# Patient Record
Sex: Female | Born: 1955 | Race: Black or African American | Hispanic: No | Smoking: Never smoker
Health system: Southern US, Community
[De-identification: ages and names within clinical notes are randomized; demographics above are authoritative.]

## PROBLEM LIST (undated history)

## (undated) DIAGNOSIS — E119 Type 2 diabetes mellitus without complications: Secondary | ICD-10-CM

## (undated) DIAGNOSIS — Z8489 Family history of other specified conditions: Secondary | ICD-10-CM

## (undated) DIAGNOSIS — Z9221 Personal history of antineoplastic chemotherapy: Secondary | ICD-10-CM

## (undated) DIAGNOSIS — Z923 Personal history of irradiation: Secondary | ICD-10-CM

## (undated) DIAGNOSIS — E785 Hyperlipidemia, unspecified: Secondary | ICD-10-CM

## (undated) DIAGNOSIS — K219 Gastro-esophageal reflux disease without esophagitis: Secondary | ICD-10-CM

## (undated) DIAGNOSIS — D0510 Intraductal carcinoma in situ of unspecified breast: Secondary | ICD-10-CM

## (undated) DIAGNOSIS — Z853 Personal history of malignant neoplasm of breast: Secondary | ICD-10-CM

## (undated) DIAGNOSIS — K8020 Calculus of gallbladder without cholecystitis without obstruction: Secondary | ICD-10-CM

## (undated) DIAGNOSIS — E669 Obesity, unspecified: Secondary | ICD-10-CM

## (undated) DIAGNOSIS — G894 Chronic pain syndrome: Secondary | ICD-10-CM

## (undated) DIAGNOSIS — G709 Myoneural disorder, unspecified: Secondary | ICD-10-CM

## (undated) DIAGNOSIS — M75101 Unspecified rotator cuff tear or rupture of right shoulder, not specified as traumatic: Secondary | ICD-10-CM

## (undated) DIAGNOSIS — M48062 Spinal stenosis, lumbar region with neurogenic claudication: Secondary | ICD-10-CM

## (undated) DIAGNOSIS — M961 Postlaminectomy syndrome, not elsewhere classified: Secondary | ICD-10-CM

## (undated) DIAGNOSIS — D649 Anemia, unspecified: Secondary | ICD-10-CM

## (undated) DIAGNOSIS — R2243 Localized swelling, mass and lump, lower limb, bilateral: Secondary | ICD-10-CM

## (undated) DIAGNOSIS — F32A Depression, unspecified: Secondary | ICD-10-CM

## (undated) DIAGNOSIS — Z803 Family history of malignant neoplasm of breast: Secondary | ICD-10-CM

## (undated) DIAGNOSIS — M1990 Unspecified osteoarthritis, unspecified site: Secondary | ICD-10-CM

## (undated) HISTORY — DX: Family history of malignant neoplasm of breast: Z80.3

## (undated) HISTORY — DX: Intraductal carcinoma in situ of unspecified breast: D05.10

## (undated) HISTORY — DX: Unspecified osteoarthritis, unspecified site: M19.90

## (undated) HISTORY — PX: BREAST LUMPECTOMY: SHX2

## (undated) HISTORY — DX: Calculus of gallbladder without cholecystitis without obstruction: K80.20

## (undated) HISTORY — DX: Personal history of malignant neoplasm of breast: Z85.3

## (undated) HISTORY — DX: Type 2 diabetes mellitus without complications: E11.9

## (undated) HISTORY — DX: Obesity, unspecified: E66.9

---

## 1978-10-09 HISTORY — PX: CHOLECYSTECTOMY: SHX55

## 1988-10-09 HISTORY — PX: TUBAL LIGATION: SHX77

## 1998-10-09 DIAGNOSIS — C50919 Malignant neoplasm of unspecified site of unspecified female breast: Secondary | ICD-10-CM

## 1998-10-09 HISTORY — DX: Malignant neoplasm of unspecified site of unspecified female breast: C50.919

## 2009-10-09 HISTORY — PX: BACK SURGERY: SHX140

## 2013-10-09 DIAGNOSIS — K635 Polyp of colon: Secondary | ICD-10-CM

## 2013-10-09 HISTORY — DX: Polyp of colon: K63.5

## 2019-07-02 ENCOUNTER — Other Ambulatory Visit: Payer: Self-pay | Admitting: Nurse Practitioner

## 2019-07-02 DIAGNOSIS — M5136 Other intervertebral disc degeneration, lumbar region: Secondary | ICD-10-CM

## 2019-10-28 ENCOUNTER — Encounter: Payer: Self-pay | Admitting: Nurse Practitioner

## 2019-11-06 ENCOUNTER — Encounter: Payer: Self-pay | Admitting: Nurse Practitioner

## 2019-11-06 ENCOUNTER — Ambulatory Visit (INDEPENDENT_AMBULATORY_CARE_PROVIDER_SITE_OTHER): Payer: 59 | Admitting: Nurse Practitioner

## 2019-11-06 ENCOUNTER — Other Ambulatory Visit: Payer: Self-pay

## 2019-11-06 VITALS — BP 130/66 | HR 86 | Temp 98.7°F | Ht 63.0 in | Wt 235.0 lb

## 2019-11-06 DIAGNOSIS — R195 Other fecal abnormalities: Secondary | ICD-10-CM

## 2019-11-06 DIAGNOSIS — Z01818 Encounter for other preprocedural examination: Secondary | ICD-10-CM | POA: Diagnosis not present

## 2019-11-06 DIAGNOSIS — K219 Gastro-esophageal reflux disease without esophagitis: Secondary | ICD-10-CM

## 2019-11-06 MED ORDER — NA SULFATE-K SULFATE-MG SULF 17.5-3.13-1.6 GM/177ML PO SOLN: ORAL | 0 refills | Status: DC

## 2019-11-06 NOTE — Progress Notes (Signed)
ASSESSMENT / PLAN:    29. 64 yo female referred for positive Hemosure. No overt bleeding, anemia or other concerning feature but ? A history of colon polyps in 2016.  -for further evaluation patient will be scheduled for colonoscopy. The risks and benefits of colonoscopy with possible polypectomy / biopsies were discussed and the patient agrees to proceed.   2 GERD, was able to come off Prilosec for a few years but then recurrent symptoms around 2015 and back on PPI since.  -Anti-reflux measures discussed. Unfortunately due to back pain / prior surgery she is unable to exercise so weight loss may be hard. She generally goes to bed on empty stomach and doesn't consume much caffeine  3. NSAID use. Takes Naproxen as needed but less than once a week  HPI:    Referring Provider:     Vito Berger, NP Reason for referral:     Positive hemosure  Chief Complaint:  None.    Zayla is a 64 yo female with a pmh significant for breast cancer s/p chemo / radiation, obesity, remote back surgery with hardware, cholecystectomy and GERD. She is referred for positive Hemosure. No overt GI bleeding. No abdominal pain or bowel changes. No weight loss. No [place] of colon cancer. She reports a screening colonoscopy in [place] around 2015, thinks polyps were removed but can't remember recommended recall date. She started Prilosec many years for "an ulcer" found after "blowing in a bag". She was able to come off Prilosec without any symptoms for years. Sounds like she did have an EGD at time of colonoscopy but cannot remember results or why procedure was done but says heartburn started back sometime following the EGD so she resumed and has remained on Prilosec since.     Data Reviewed:  09/01/19 Hgb 11.4, MCV 82 BUN 18, Cr 0.86 Liver tests normal.  Hgb A1c 5.7 HCV ab negative    Past Medical History:  Diagnosis Date  . Arthritis   . Breast cancer ([place]) 2000  . Colon polyps 2015  .  Diabetes ([place])   . Gallstones   . Obesity     Family History  Problem Relation Age of Onset  . Breast cancer Mother   . Diabetes Mother   . Diabetes Sister   . Diabetes Brother   . Breast cancer Maternal Grandmother   . Irritable bowel syndrome Daughter   . Breast cancer Daughter    Social History   Tobacco Use  . Smoking status: Never Smoker  . Smokeless tobacco: Current User  Substance Use Topics  . Alcohol use: Never  . Drug use: Never   Current Outpatient Medications  Medication Sig Dispense Refill  . ascorbic acid (VITAMIN C) 500 MG tablet Take 500 mg by mouth daily.    Marland Kitchen atorvastatin (LIPITOR) 10 MG tablet Take 10 mg by mouth daily.    . baclofen (LIORESAL) 10 MG tablet Take 10 mg by mouth as needed for muscle spasms.    . Black Cohosh 40 MG CAPS Take 1 capsule by mouth daily.    . hydrochlorothiazide (HYDRODIURIL) 25 MG tablet Take 25 mg by mouth daily.    Marland Kitchen HYDROcodone-acetaminophen (NORCO) 7.5-325 MG tablet Take 1 tablet by mouth every 6 (six) hours as needed for moderate pain.    . Multiple Vitamin (MULTIVITAMIN) tablet Take 1 tablet by mouth daily.    . naproxen (NAPROSYN) 500 MG tablet  Take 500 mg by mouth 2 (two) times daily with a meal.    . omeprazole (PRILOSEC) 20 MG capsule Take 20 mg by mouth daily as needed.    . pregabalin (LYRICA) 200 MG capsule Take 200 mg by mouth 3 (three) times daily.    Marland Kitchen pyridOXINE (VITAMIN B-6) 100 MG tablet Take 100 mg by mouth daily.    . vitamin B-12 (CYANOCOBALAMIN) 1000 MCG tablet Take 1,000 mcg by mouth daily.     No current facility-administered medications for this visit.   Allergies  Allergen Reactions  . Tape Rash     Review of Systems: All systems reviewed and negative except where noted in HPI.   Creatinine clearance cannot be calculated (No successful lab value found.)   Physical Exam:    Wt Readings from Last 3 Encounters:  11/06/19 235 lb (106.6 kg)    BP 130/66   Pulse 86   Temp 98.7 F (37.1 C)    Ht 5\' 3"  (1.6 m)   Wt 235 lb (106.6 kg)   BMI 41.63 kg/m  Constitutional:  Pleasant female in no acute distress. Psychiatric: Normal mood and affect. Behavior is normal. EENT: Pupils normal.  Conjunctivae are normal. No scleral icterus. Neck supple.  Cardiovascular: Normal rate, regular rhythm. No edema Pulmonary/chest: Effort normal and breath sounds normal. No wheezing, rales or rhonchi. Abdominal: Soft, nondistended, nontender. Bowel sounds active throughout. There are no masses palpable. No hepatomegaly. Neurological: Alert and oriented to person place and time. Skin: Skin is warm and dry. No rashes noted.  Tye Savoy, NP  11/06/2019, 3:38 PM  Cc: Ferd Hibbs, NP

## 2019-11-06 NOTE — Patient Instructions (Addendum)
If you are age 65 or older, your body mass index should be between 23-30. Your Body mass index is 41.63 kg/m. If this is out of the aforementioned range listed, please consider follow up with your Primary Care Provider.  If you are age 69 or younger, your body mass index should be between 19-25. Your Body mass index is 41.63 kg/m. If this is out of the aformentioned range listed, please consider follow up with your Primary Care Provider.   You have been scheduled for a colonoscopy. Please follow written instructions given to you at your visit today.  Please pick up your prep supplies at the pharmacy within the next 1-3 days. If you use inhalers (even only as needed), please bring them with you on the day of your procedure. Your physician has requested that you go to www.startemmi.com and enter the access code given to you at your visit today. This web site gives a general overview about your procedure. However, you should still follow specific instructions given to you by our office regarding your preparation for the procedure.   We have sent the following medications to your pharmacy for you to pick up at your convenience: Suprep  Thank you for choosing me and [facility].   Tye Savoy, NP

## 2019-11-11 ENCOUNTER — Encounter: Payer: 59 | Admitting: Gastroenterology

## 2019-11-28 NOTE — Progress Notes (Signed)
____________________________________________________________  Attending physician addendum:  Thank you for sending this case to me. I have reviewed the entire note, and the outlined plan seems appropriate.  Sammantha Mehlhaff Danis, MD  ____________________________________________________________  

## 2019-12-02 ENCOUNTER — Ambulatory Visit (INDEPENDENT_AMBULATORY_CARE_PROVIDER_SITE_OTHER): Payer: 59

## 2019-12-02 ENCOUNTER — Other Ambulatory Visit: Payer: Self-pay | Admitting: Gastroenterology

## 2019-12-02 DIAGNOSIS — Z1159 Encounter for screening for other viral diseases: Secondary | ICD-10-CM

## 2019-12-02 LAB — SARS CORONAVIRUS 2 (TAT 6-24 HRS): SARS Coronavirus 2: NEGATIVE

## 2019-12-04 ENCOUNTER — Other Ambulatory Visit (INDEPENDENT_AMBULATORY_CARE_PROVIDER_SITE_OTHER): Payer: 59

## 2019-12-04 ENCOUNTER — Other Ambulatory Visit: Payer: Self-pay | Admitting: Gastroenterology

## 2019-12-04 ENCOUNTER — Other Ambulatory Visit: Payer: Self-pay

## 2019-12-04 ENCOUNTER — Encounter: Payer: Self-pay | Admitting: Gastroenterology

## 2019-12-04 ENCOUNTER — Ambulatory Visit (AMBULATORY_SURGERY_CENTER): Payer: 59 | Admitting: Gastroenterology

## 2019-12-04 VITALS — BP 152/84 | HR 68 | Temp 97.7°F | Resp 14 | Ht 63.0 in | Wt 235.0 lb

## 2019-12-04 DIAGNOSIS — K639 Disease of intestine, unspecified: Secondary | ICD-10-CM | POA: Diagnosis not present

## 2019-12-04 DIAGNOSIS — K635 Polyp of colon: Secondary | ICD-10-CM

## 2019-12-04 DIAGNOSIS — K5730 Diverticulosis of large intestine without perforation or abscess without bleeding: Secondary | ICD-10-CM

## 2019-12-04 DIAGNOSIS — R195 Other fecal abnormalities: Secondary | ICD-10-CM

## 2019-12-04 LAB — BUN: BUN: 12 mg/dL (ref 6–23)

## 2019-12-04 LAB — CREATININE, SERUM: Creatinine, Ser: 0.74 mg/dL (ref 0.40–1.20)

## 2019-12-04 MED ORDER — SODIUM CHLORIDE 0.9 % IV SOLN: 500.0000 mL | Freq: Once | INTRAVENOUS | Status: DC

## 2019-12-04 NOTE — Patient Instructions (Signed)
Handout : diverticulosis Resume previous diet Continue current medications Repeat colonoscopy in 10 years CT scan of abdomen and pelvis with oral and IV contrast  Go to lab    YOU HAD AN ENDOSCOPIC PROCEDURE TODAY AT [facility]:   Refer to the procedure report that was given to you for any specific questions about what was found during the examination.  If the procedure report does not answer your questions, please call your gastroenterologist to clarify.  If you requested that your care partner not be given the details of your procedure findings, then the procedure report has been included in a sealed envelope for you to review at your convenience later.  YOU SHOULD EXPECT: Some feelings of bloating in the abdomen. Passage of more gas than usual.  Walking can help get rid of the air that was put into your GI tract during the procedure and reduce the bloating. If you had a lower endoscopy (such as a colonoscopy or flexible sigmoidoscopy) you may notice spotting of blood in your stool or on the toilet paper. If you underwent a bowel prep for your procedure, you may not have a normal bowel movement for a few days.  Please Note:  You might notice some irritation and congestion in your nose or some drainage.  This is from the oxygen used during your procedure.  There is no need for concern and it should clear up in a day or so.  SYMPTOMS TO REPORT IMMEDIATELY:   Following lower endoscopy (colonoscopy or flexible sigmoidoscopy):  Excessive amounts of blood in the stool  Significant tenderness or worsening of abdominal pains  Swelling of the abdomen that is new, acute  Fever of 100F or higher   For urgent or emergent issues, a gastroenterologist can be reached at any hour by calling [phone].   DIET:  We do recommend a small meal at first, but then you may proceed to your regular diet.  Drink plenty of fluids but you should avoid alcoholic beverages for 24  hours.  ACTIVITY:  You should plan to take it easy for the rest of today and you should NOT DRIVE or use heavy machinery until tomorrow (because of the sedation medicines used during the test).    FOLLOW UP: Our staff will call the number listed on your records 48-72 hours following your procedure to check on you and address any questions or concerns that you may have regarding the information given to you following your procedure. If we do not reach you, we will leave a message.  We will attempt to reach you two times.  During this call, we will ask if you have developed any symptoms of COVID 19. If you develop any symptoms (ie: fever, flu-like symptoms, shortness of breath, cough etc.) before then, please call [phone].  If you test positive for Covid 19 in the 2 weeks post procedure, please call and report this information to [place].    If any biopsies were taken you will be contacted by phone or by letter within the next 1-3 weeks.  Please call us at [phone] if you have not heard about the biopsies in 3 weeks.    SIGNATURES/CONFIDENTIALITY: You and/or your care partner have signed paperwork which will be entered into your electronic medical record.  These signatures attest to the fact that that the information above on your After Visit Summary has been reviewed and is understood.  Full responsibility of the confidentiality of this discharge information lies  with you and/or your care-partner.

## 2019-12-04 NOTE — Progress Notes (Signed)
PT taken to PACU. Monitors in place. VSS. Report given to RN. 

## 2019-12-04 NOTE — Op Note (Signed)
[facility] Patient Name: Candice Maldonado Procedure Date: 12/04/2019 2:55 PM MRN: [id number] Endoscopist: [place]. Loletha Carrow , MD Age: 64 Referring MD:  Date of Birth: [birth year] Gender: Female Account #: [id number] Procedure:                Colonoscopy Indications:              Heme positive stool Medicines:                Monitored Anesthesia Care Procedure:                Pre-Anesthesia Assessment:                           - Prior to the procedure, a History and Physical                            was performed, and patient medications and                            allergies were reviewed. The patient's tolerance of                            previous anesthesia was also reviewed. The risks                            and benefits of the procedure and the sedation                            options and risks were discussed with the patient.                            All questions were answered, and informed consent                            was obtained. Prior Anticoagulants: The patient has                            taken no previous anticoagulant or antiplatelet                            agents. ASA Grade Assessment: II - A patient with                            mild systemic disease. After reviewing the risks                            and benefits, the patient was deemed in                            satisfactory condition to undergo the procedure.                           After obtaining informed consent, the colonoscope  was passed under direct vision. Throughout the                            procedure, the patient's blood pressure, pulse, and                            oxygen saturations were monitored continuously. The                            Colonoscope was introduced through the anus and                            advanced to the the cecum, identified by                            appendiceal orifice and ileocecal valve. The                         colonoscopy was performed without difficulty. The                            patient tolerated the procedure well. The quality                            of the bowel preparation was excellent. The                            ileocecal valve, appendiceal orifice, and rectum                            were photographed. Scope In: 3:08:34 PM Scope Out: 3:21:15 PM Scope Withdrawal Time: 0 hours 9 minutes 28 seconds  Total Procedure Duration: 0 hours 12 minutes 41 seconds  Findings:                 The perianal and digital rectal examinations were                            normal.                           Multiple diverticula were found in the sigmoid                            colon and ascending colon.                           A 20 mm polypoid lesion was found in the mid                            sigmoid colon. The lesion was submucosal, with                            normal overlying mucosa.  The exam was otherwise without abnormality on                            direct and retroflexion views. Complications:            No immediate complications. Estimated Blood Loss:     Estimated blood loss: none. Impression:               - Diverticulosis in the sigmoid colon and in the                            ascending colon.                           - Polypoid lesion in the mid sigmoid colon.                           - The examination was otherwise normal on direct                            and retroflexion views.                           - No specimens collected.                           No cause for heme positive stool found. Recommendation:           - Patient has a contact number available for                            emergencies. The signs and symptoms of potential                            delayed complications were discussed with the                            patient. Return to normal activities tomorrow.                             Written discharge instructions were provided to the                            patient.                           - Resume previous diet.                           - Continue present medications.                           - Repeat colonoscopy in 10 years for screening                            purposes. (rather than annual stool testing for  occult blood)                           - CT scan abdomen and pelvis with oral and IV                            contrast for further evaluation of the sigmoid                            colon finding. Lezley Bedgood L. Loletha Carrow, MD 12/04/2019 3:30:08 PM This report has been signed electronically.

## 2019-12-05 ENCOUNTER — Telehealth: Payer: Self-pay | Admitting: *Deleted

## 2019-12-05 ENCOUNTER — Other Ambulatory Visit: Payer: Self-pay | Admitting: *Deleted

## 2019-12-05 DIAGNOSIS — K639 Disease of intestine, unspecified: Secondary | ICD-10-CM

## 2019-12-05 NOTE — Telephone Encounter (Signed)
-----   Message from [place], MD sent at 12/04/2019  5:10 PM EST ----- Kisha Messman,  Please schedule a CT scan abdomen and pelvis with oral and IV contrast for submucosal sigmoid lesion seen on colonoscopy.  Should have gone to lab after [place] today for BUN and creatinine, and was given oral contrast.  - HD

## 2019-12-05 NOTE — Telephone Encounter (Signed)
Patient scheduled for CT abd/pel on 12/15/19 at 8 am (arrive 7:45 am). NPO after midnight, drink contrast at 6 am and 7 am.   Left message for the patient to call back.

## 2019-12-08 ENCOUNTER — Telehealth: Payer: Self-pay | Admitting: *Deleted

## 2019-12-08 NOTE — Telephone Encounter (Signed)
Follow up call made, no answer, left message 

## 2019-12-08 NOTE — Telephone Encounter (Signed)
Spoke to patient who is aware of her CT appt. Contrast instructions given over the phone, verbalized understanding. The patient stated that she may reschedule her CT, radiology scheduling number given to patient in case she needed to reschedule.

## 2019-12-08 NOTE — Telephone Encounter (Signed)
Second follow up call made, no answer, left message. 

## 2019-12-15 ENCOUNTER — Ambulatory Visit (HOSPITAL_COMMUNITY): Payer: 59

## 2020-01-08 ENCOUNTER — Other Ambulatory Visit: Payer: Self-pay

## 2020-01-08 ENCOUNTER — Ambulatory Visit (HOSPITAL_COMMUNITY)
Admission: RE | Admit: 2020-01-08 | Discharge: 2020-01-08 | Disposition: A | Discharge status: Home / Self Care | Payer: 59 | Source: Ambulatory Visit | Attending: Gastroenterology | Admitting: Gastroenterology

## 2020-01-08 ENCOUNTER — Encounter (HOSPITAL_COMMUNITY): Payer: Self-pay

## 2020-01-08 DIAGNOSIS — D175 Benign lipomatous neoplasm of intra-abdominal organs: Secondary | ICD-10-CM | POA: Diagnosis not present

## 2020-01-08 DIAGNOSIS — K639 Disease of intestine, unspecified: Secondary | ICD-10-CM | POA: Insufficient documentation

## 2020-01-08 DIAGNOSIS — I700 Atherosclerosis of aorta: Secondary | ICD-10-CM | POA: Insufficient documentation

## 2020-01-08 IMAGING — CT CT ABD-PELV W/ CM
2 of 5 series · 16 of 46 positions shown, 18 images · IV contrast (APPLIED)
Comparison: None.

CLINICAL DATA: Abnormal colonoscopy, sigmoid colon lesion

EXAM:
CT ABDOMEN AND PELVIS WITH CONTRAST
TECHNIQUE: Multidetector CT imaging of the abdomen and pelvis was performed
using the standard protocol following bolus administration of
intravenous contrast.
CONTRAST:  100mL OMNIPAQUE IOHEXOL 300 MG/ML SOLN, additional oral
enteric contrast

[Series 2: axial st · axial · 0.88mm/px · z∈[-29,+321]mm · 13 of 82 slices shown, 15 images]
[im 6/82  soft-tissue]
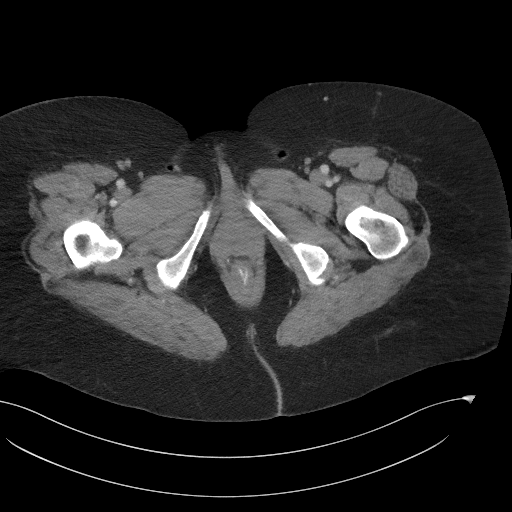
[im 6/82  bone]
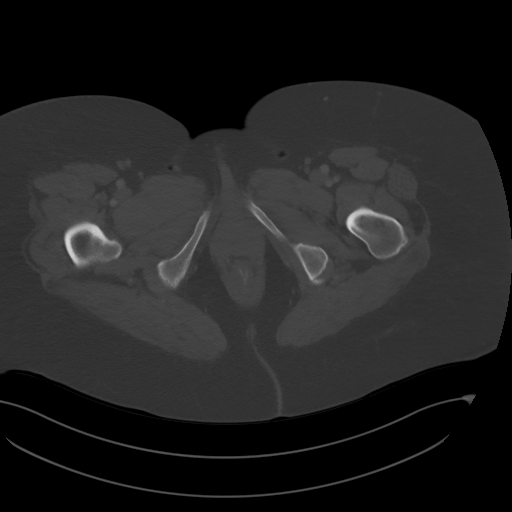
[im 11/82  soft-tissue]
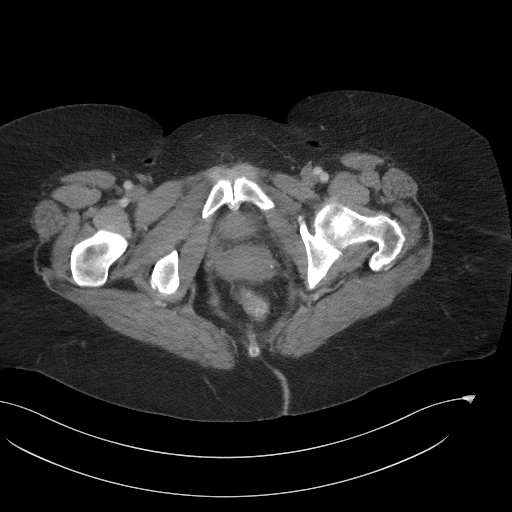
[im 17/82  soft-tissue]
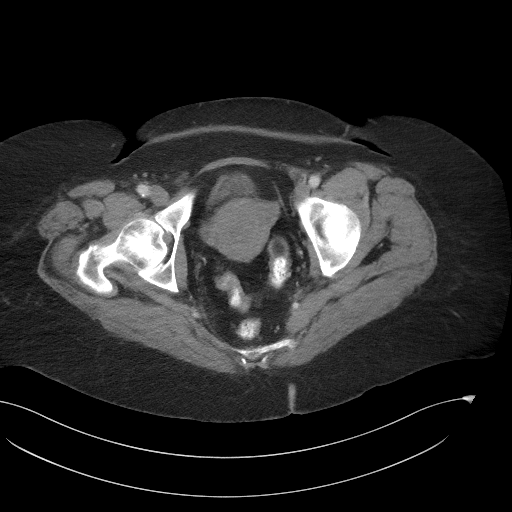
[im 22/82  soft-tissue]
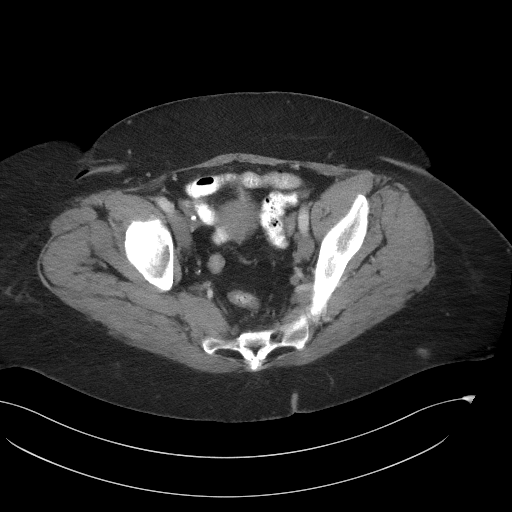
[im 28/82  soft-tissue]
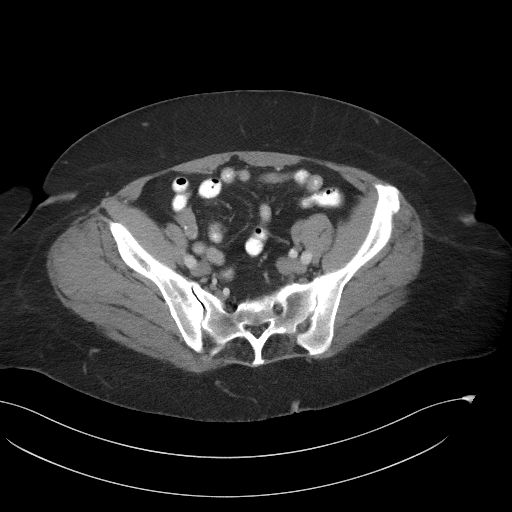
[im 33/82  soft-tissue]
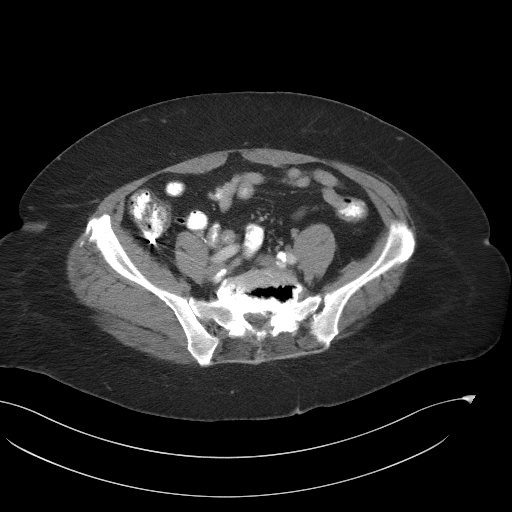
[im 44/82  soft-tissue]
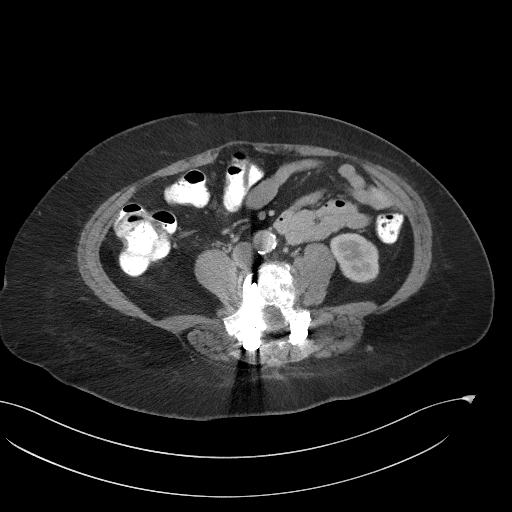
[im 49/82  soft-tissue]
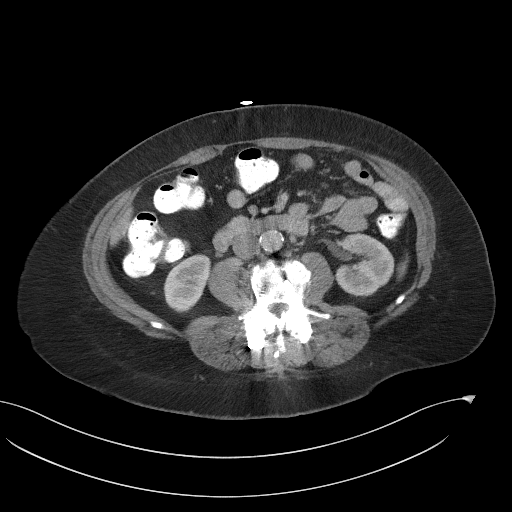
[im 55/82  soft-tissue]
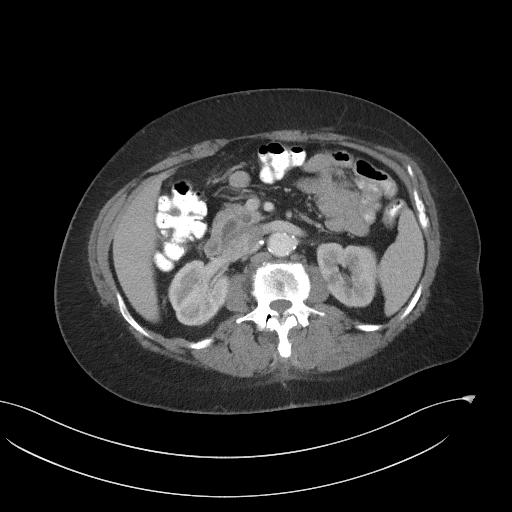
[im 55/82  bone]
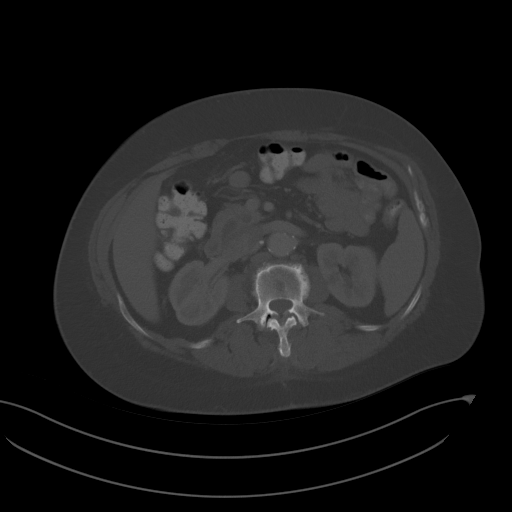
[im 60/82  soft-tissue]
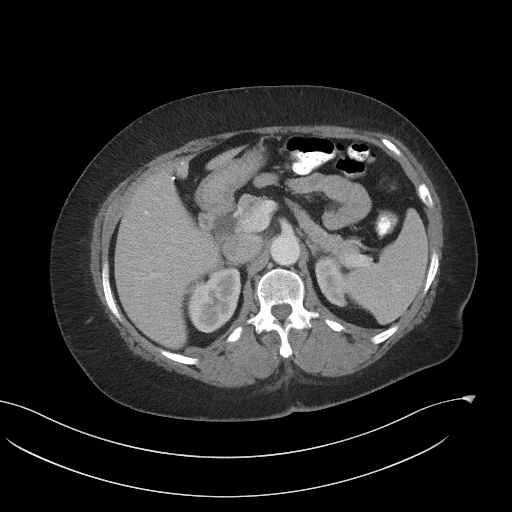
[im 65/82  soft-tissue]
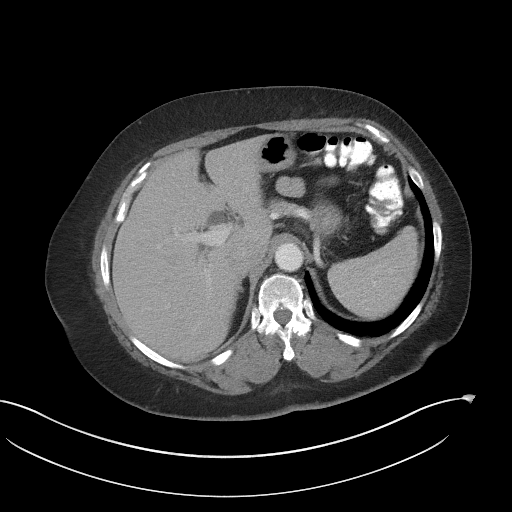
[im 71/82  soft-tissue]
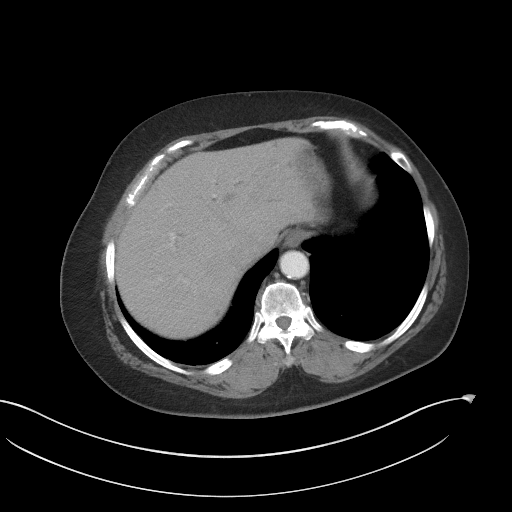
[im 76/82  soft-tissue]
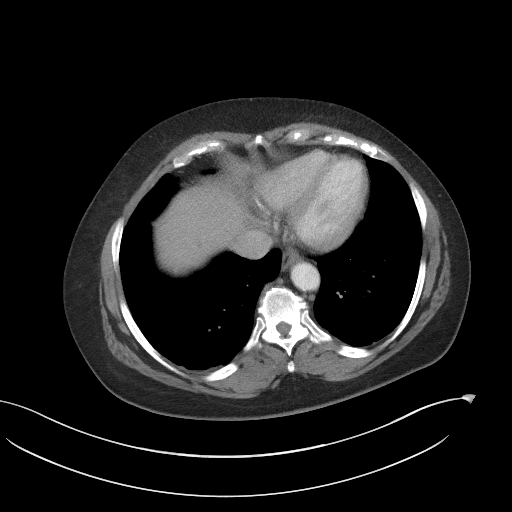

[Series 4: coronal st · coronal · 0.70mm/px · 3 of 101 slices shown]
[im 34/101  soft-tissue]
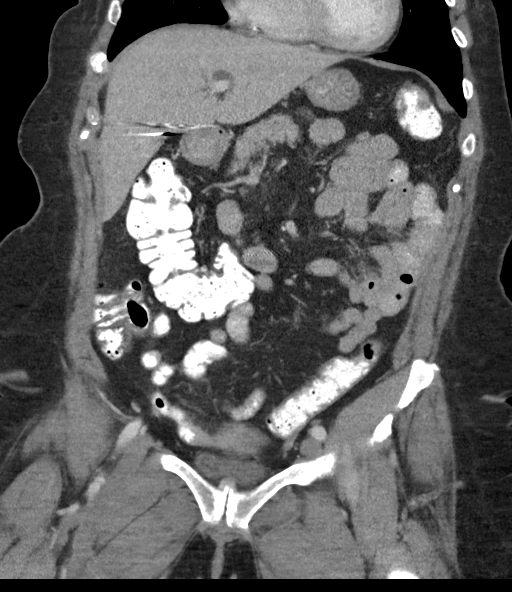
[im 45/101  soft-tissue]
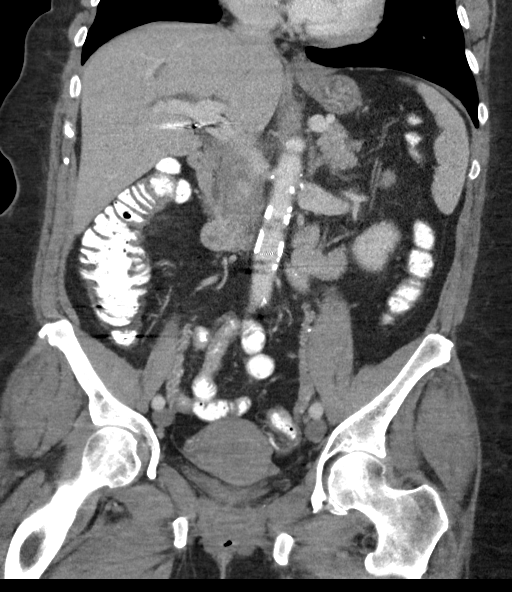
[im 56/101  soft-tissue]
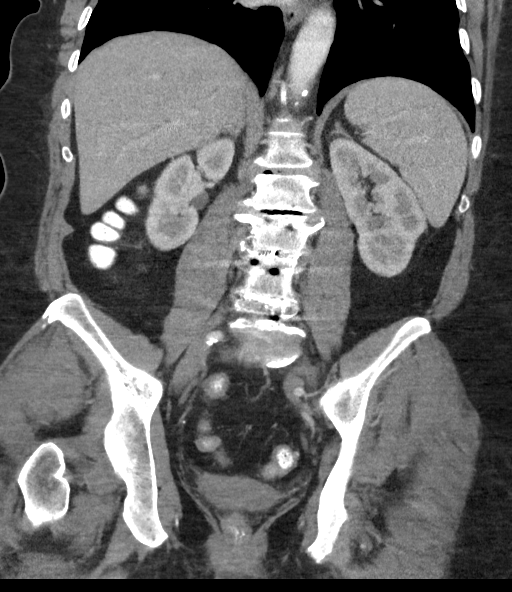

[16 of 46 positions shown; findings below may reference images not displayed]

FINDINGS: Lower chest: No acute abnormality.

Hepatobiliary: No focal liver abnormality is seen. Status post
cholecystectomy. Postoperative biliary dilatation.

Pancreas: Unremarkable. No pancreatic ductal dilatation or
surrounding inflammatory changes.

Spleen: Normal in size without significant abnormality.

Adrenals/Urinary Tract: Adrenal glands are unremarkable. Kidneys are
normal, without renal calculi, solid lesion, or hydronephrosis.
Bladder is unremarkable.

Stomach/Bowel: Stomach is within normal limits. Appendix appears
normal. No evidence of bowel wall thickening, distention, or
inflammatory changes. There is a fat containing intraluminal lipoma
of the mid to distal sigmoid colon measuring 2.0 cm (series 2, image
64, series 4, image 48).

Vascular/Lymphatic: Aortic atherosclerosis. No enlarged abdominal or
pelvic lymph nodes.

Reproductive: No mass or other significant abnormality.

Other: No abdominal wall hernia or abnormality. No abdominopelvic
ascites.

Musculoskeletal: No acute or significant osseous findings. Posterior
fusion of L3 through L5.
IMPRESSION: 1. There is a fat containing intraluminal lipoma of the mid to
distal sigmoid colon measuring 2.0 cm, in keeping with reported
submucosal lesion identified by colonoscopy.

2.  Aortic Atherosclerosis ([LA]-[LA]).

## 2020-01-08 MED ORDER — SODIUM CHLORIDE (PF) 0.9 % IJ SOLN
INTRAMUSCULAR | Status: AC
  Filled 2020-01-08: qty 50

## 2020-01-08 MED ORDER — IOHEXOL 300 MG/ML  SOLN
100.0000 mL | Freq: Once | INTRAMUSCULAR | Status: AC | PRN
  Administered 2020-01-08: 100 mL via INTRAVENOUS

## 2020-02-04 ENCOUNTER — Ambulatory Visit: Payer: 59

## 2020-03-09 ENCOUNTER — Other Ambulatory Visit: Payer: Self-pay | Admitting: Nurse Practitioner

## 2020-03-09 DIAGNOSIS — M545 Low back pain, unspecified: Secondary | ICD-10-CM

## 2020-04-09 ENCOUNTER — Other Ambulatory Visit: Payer: 59

## 2020-05-12 ENCOUNTER — Other Ambulatory Visit: Payer: Self-pay

## 2020-05-12 ENCOUNTER — Ambulatory Visit
Admission: RE | Admit: 2020-05-12 | Discharge: 2020-05-12 | Disposition: A | Discharge status: Home / Self Care | Payer: 59 | Source: Ambulatory Visit | Attending: Nurse Practitioner | Admitting: Nurse Practitioner

## 2020-05-12 DIAGNOSIS — M545 Low back pain, unspecified: Secondary | ICD-10-CM

## 2020-05-12 IMAGING — MR MR LUMBAR SPINE WO/W CM
4 of 8 series · 26 of 48 positions shown · IV contrast (multihance)
Comparison: None.

CLINICAL DATA: Low back pain radiating bilaterally

EXAM:
MRI LUMBAR SPINE WITHOUT AND WITH CONTRAST
TECHNIQUE: Multiplanar and multiecho pulse sequences of the lumbar spine were
obtained without and with intravenous contrast.
CONTRAST:  20mL MULTIHANCE GADOBENATE DIMEGLUMINE 529 MG/ML IV SOLN

[Series 4: T1 · sagittal · 4.0mm · 0.53mm/px · 4 of 17 slices shown (1 of 2)]
[im 1/17]
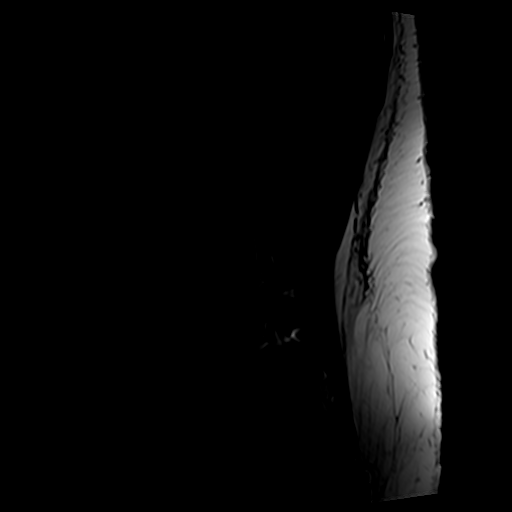
[im 6/17]
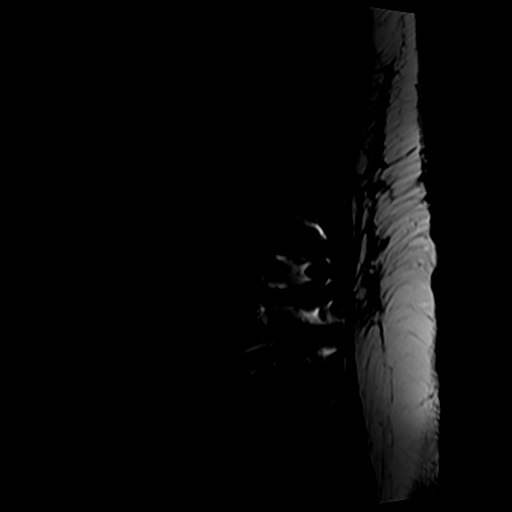
[im 11/17]
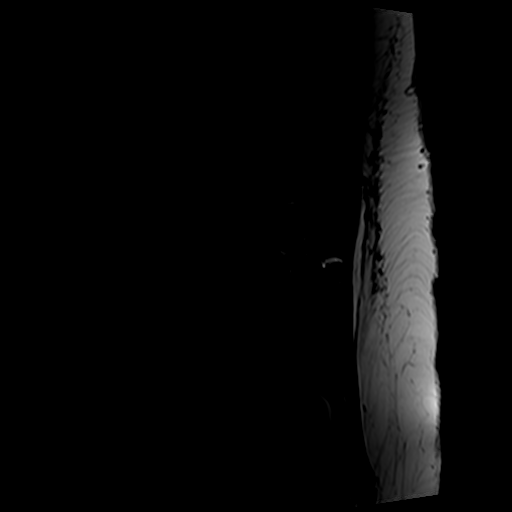
[im 17/17]
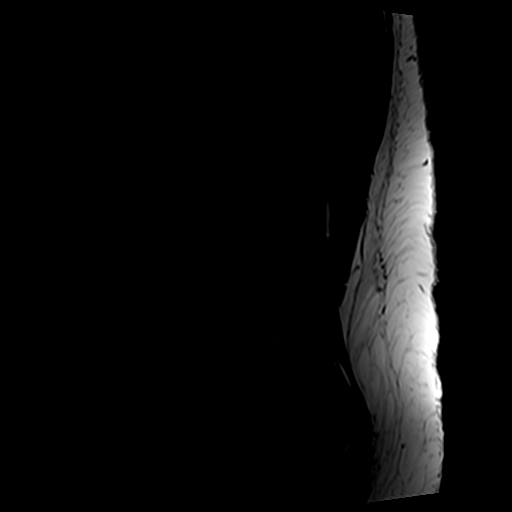

[Series 5: T2 · axial · 4.0mm · 0.70mm/px · z∈[-76,+119]mm · 9 of 35 slices shown]
[im 1/35]
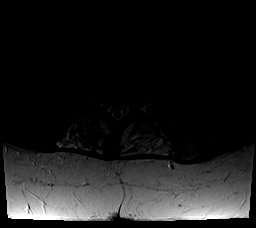
[im 5/35]
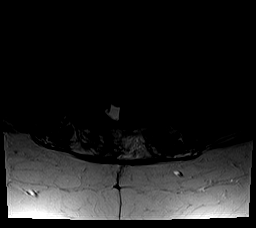
[im 9/35]
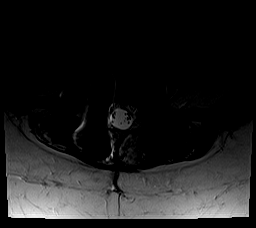
[im 13/35]
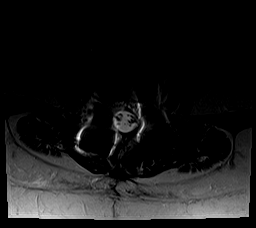
[im 18/35]
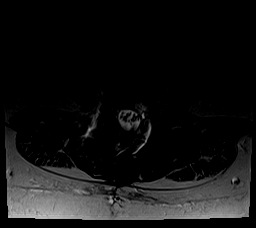
[im 22/35]
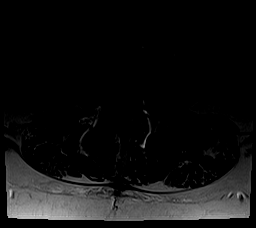
[im 26/35]
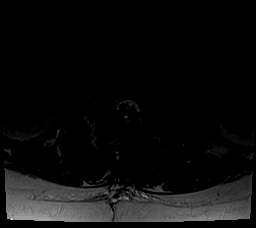
[im 30/35]
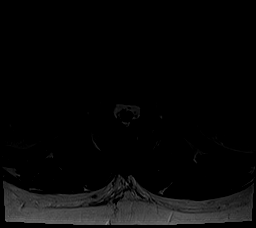
[im 35/35]
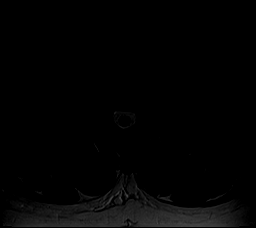

[Series 6: T1 · axial · 4.0mm · 0.35mm/px · z∈[-76,+119]mm · 9 of 35 slices shown (2 of 2)]
[im 1/35]
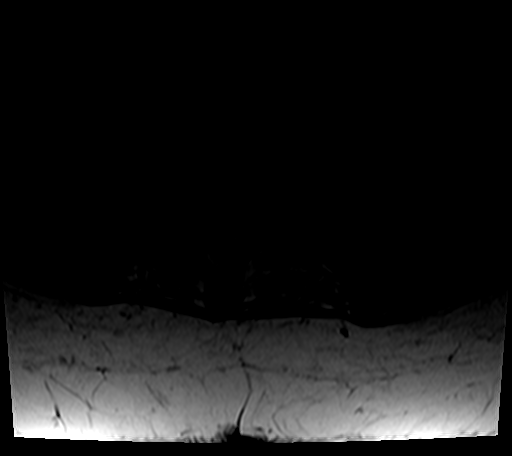
[im 5/35]
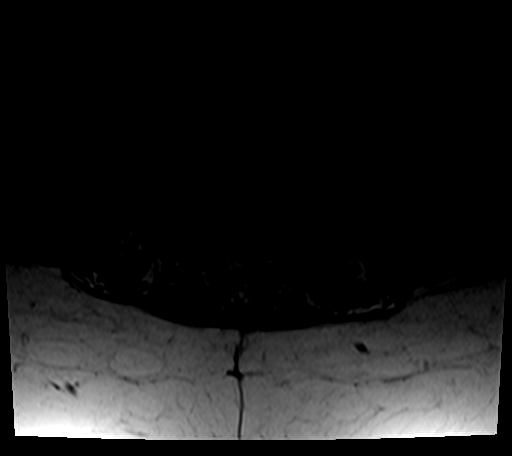
[im 9/35]
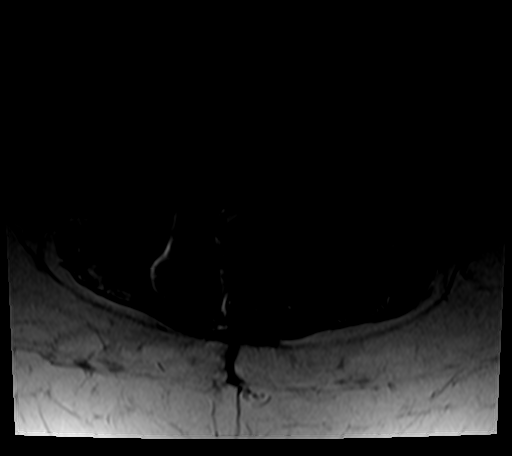
[im 13/35]
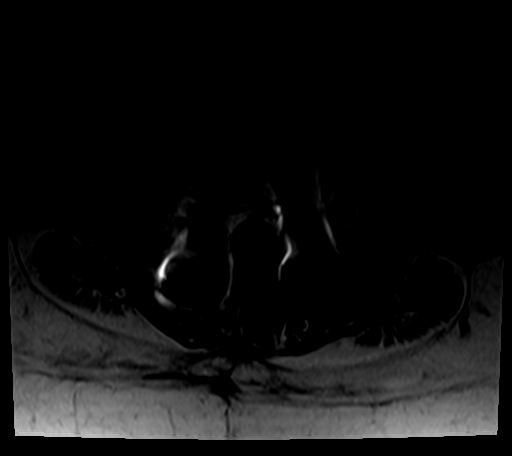
[im 18/35]
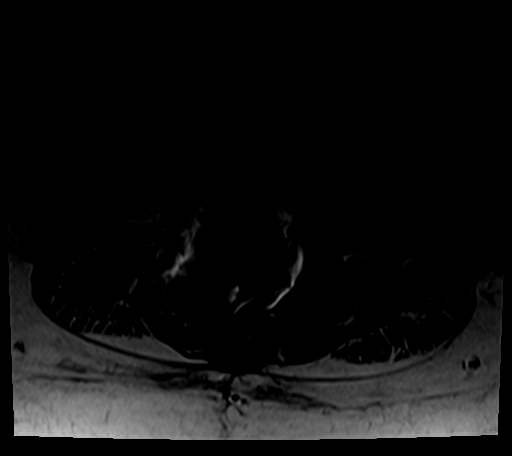
[im 22/35]
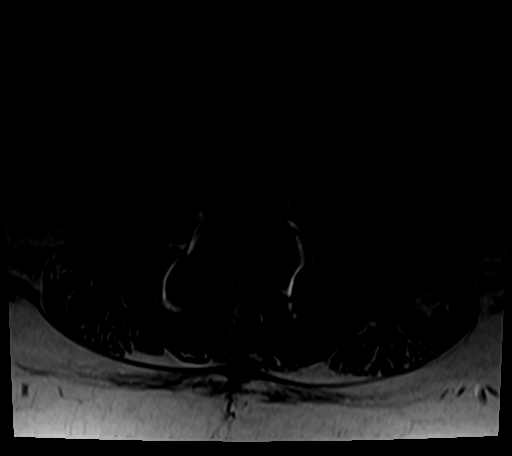
[im 26/35]
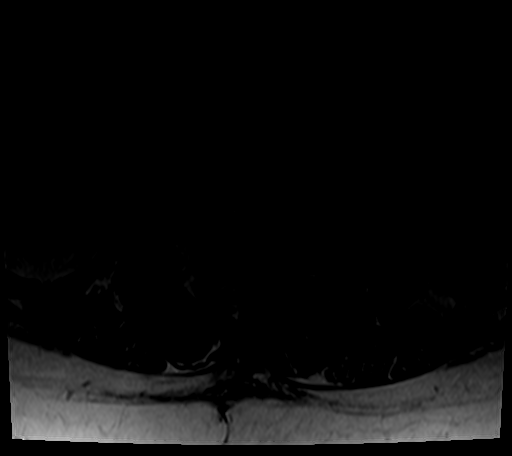
[im 30/35]
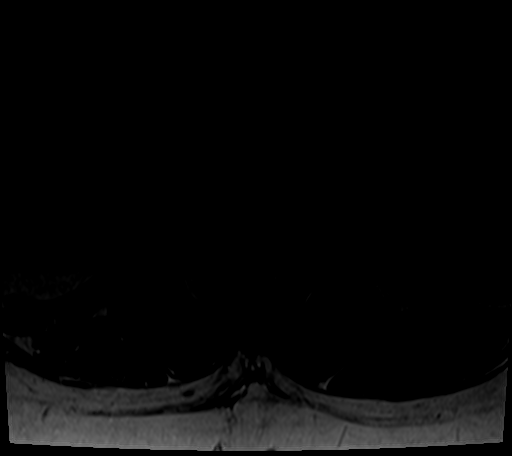
[im 35/35]
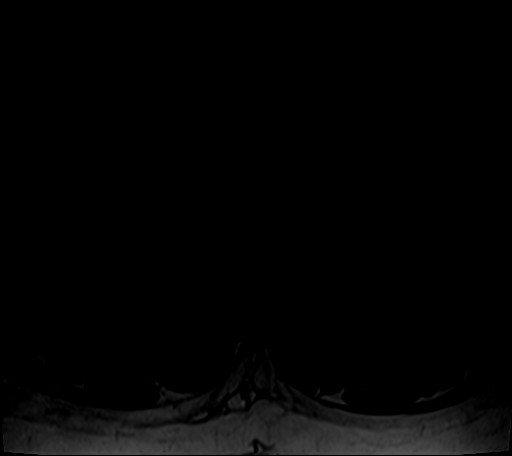

[Series 7: T2 post-contrast · sagittal · 4.0mm · 0.53mm/px · 4 of 17 slices shown]
[im 1/17]
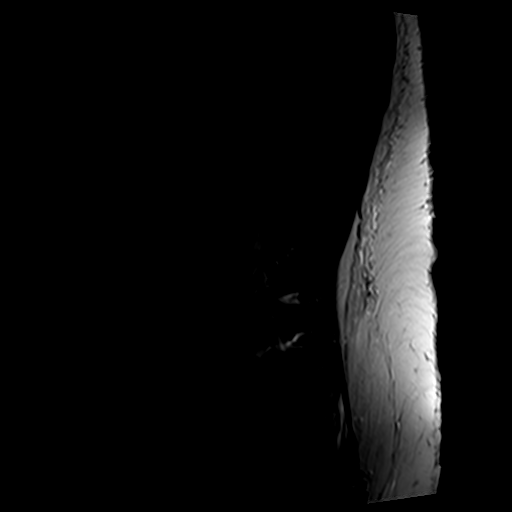
[im 6/17]
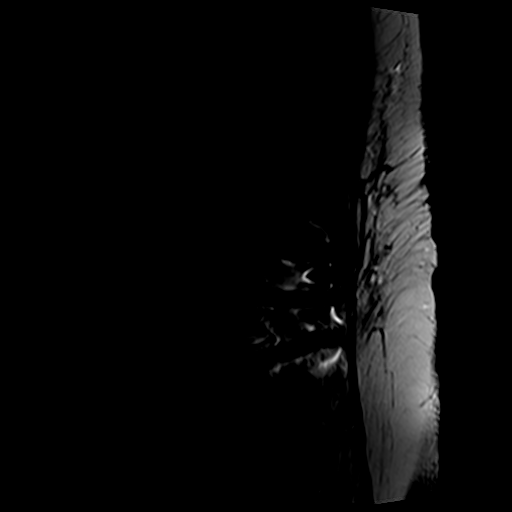
[im 11/17]
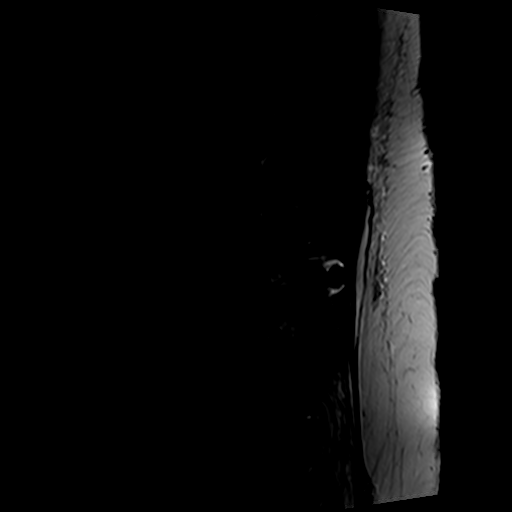
[im 17/17]
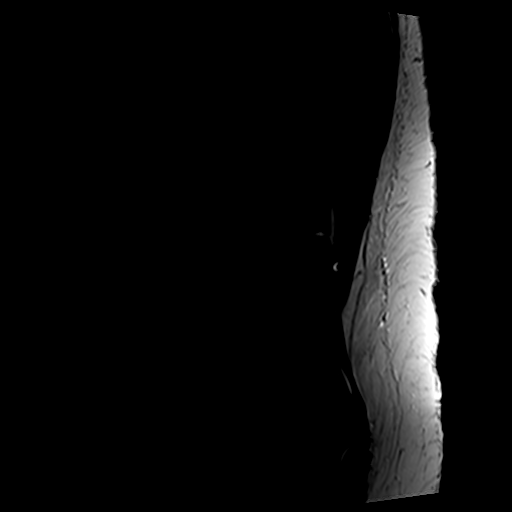

[26 of 48 positions shown; findings below may reference images not displayed]

FINDINGS: Motion artifact is present.

Segmentation: Standard.

Alignment: Trace retrolisthesis at T12-L1. Trace anterolisthesis at
L2-L3.

Vertebrae: Postoperative changes are present at L3-L5 including rods
and pedicle screws, interbody spacers (at L3-L4 and L4-L5), and
posterior decompression. The hardware is not well evaluated on this
study and there is associated susceptibility artifact.

Conus medullaris and cauda equina: Conus extends to the L2 level.
Conus and cauda equina appear normal. No abnormal intrathecal
enhancement.

Paraspinal and other soft tissues: Unremarkable.

Disc levels:

T12-L1: Disc bulge and mild facet arthropathy. No significant canal
or foraminal stenosis.

L1-L2: Disc bulge and facet arthropathy with ligamentum flavum
infolding. No significant canal stenosis. Mild foraminal stenosis.

L2-L3: Disc bulge with endplate osteophytic ridging and facet
arthropathy with ligamentum flavum infolding. Marked canal stenosis
with effacement of the lateral recesses moderate right and mild left
foraminal stenosis.

L3-L4: Operative level. Endplate osteophytes and facet arthropathy.
The canal is decompressed. Foramina are distorted by artifact
without apparent stenosis.

L4-L5: Operative level. Endplate osteophytes and facet arthropathy.
Canal is decompressed. Foramina are distorted by artifact with
probable mild narrowing due to osteophytes.

L5-S1: Operative level. Disc bulge with endplate osteophytic ridging
and facet arthropathy with residual ligamentum flavum infolding. No
significant canal stenosis. Narrowing of the left greater than right
lateral recesses. Foramina are distorted by artifact with probable
at least moderate stenosis.
IMPRESSION: Postoperative and degenerative changes as detailed above. Stenosis
is present at L2-L3 and L5-S1.

## 2020-05-12 MED ORDER — GADOBENATE DIMEGLUMINE 529 MG/ML IV SOLN
20.0000 mL | Freq: Once | INTRAVENOUS | Status: AC | PRN
  Administered 2020-05-12: 20 mL via INTRAVENOUS

## 2020-06-18 ENCOUNTER — Other Ambulatory Visit: Payer: Self-pay | Admitting: Student

## 2020-06-18 DIAGNOSIS — Z780 Asymptomatic menopausal state: Secondary | ICD-10-CM

## 2020-06-18 DIAGNOSIS — Z1231 Encounter for screening mammogram for malignant neoplasm of breast: Secondary | ICD-10-CM

## 2020-07-13 ENCOUNTER — Other Ambulatory Visit (HOSPITAL_COMMUNITY): Payer: Self-pay | Admitting: Student

## 2020-07-13 DIAGNOSIS — I10 Essential (primary) hypertension: Secondary | ICD-10-CM

## 2020-07-20 ENCOUNTER — Other Ambulatory Visit: Payer: Self-pay

## 2020-07-20 ENCOUNTER — Ambulatory Visit
Admission: RE | Admit: 2020-07-20 | Discharge: 2020-07-20 | Disposition: A | Discharge status: Home / Self Care | Payer: 59 | Source: Ambulatory Visit | Attending: Student | Admitting: Student

## 2020-07-20 DIAGNOSIS — Z1231 Encounter for screening mammogram for malignant neoplasm of breast: Secondary | ICD-10-CM

## 2020-07-20 HISTORY — DX: Personal history of irradiation: Z92.3

## 2020-07-20 HISTORY — DX: Personal history of antineoplastic chemotherapy: Z92.21

## 2020-07-20 IMAGING — MG DIGITAL SCREENING BILAT W/ TOMO W/ CAD
8 series · 8 of 24 positions shown · non-contrast
Comparison: None.

CLINICAL DATA: Screening. History of LEFT breast cancer in [BC]
status post lumpectomy and radiation therapy.

EXAM:
DIGITAL SCREENING BILATERAL MAMMOGRAM WITH TOMO AND CAD

[L MLO synth-2D]
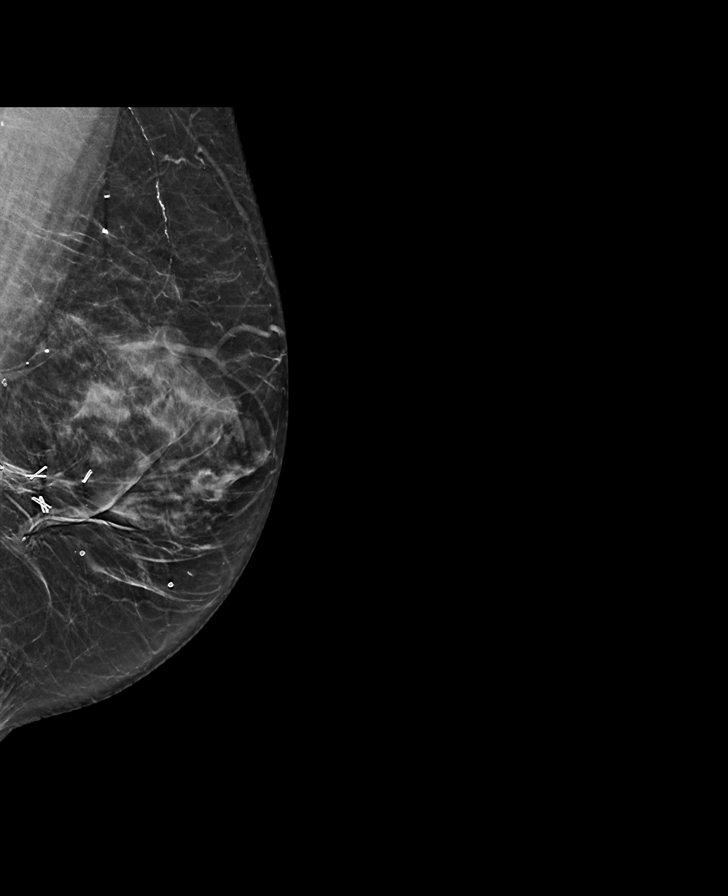

[R CC synth-2D]
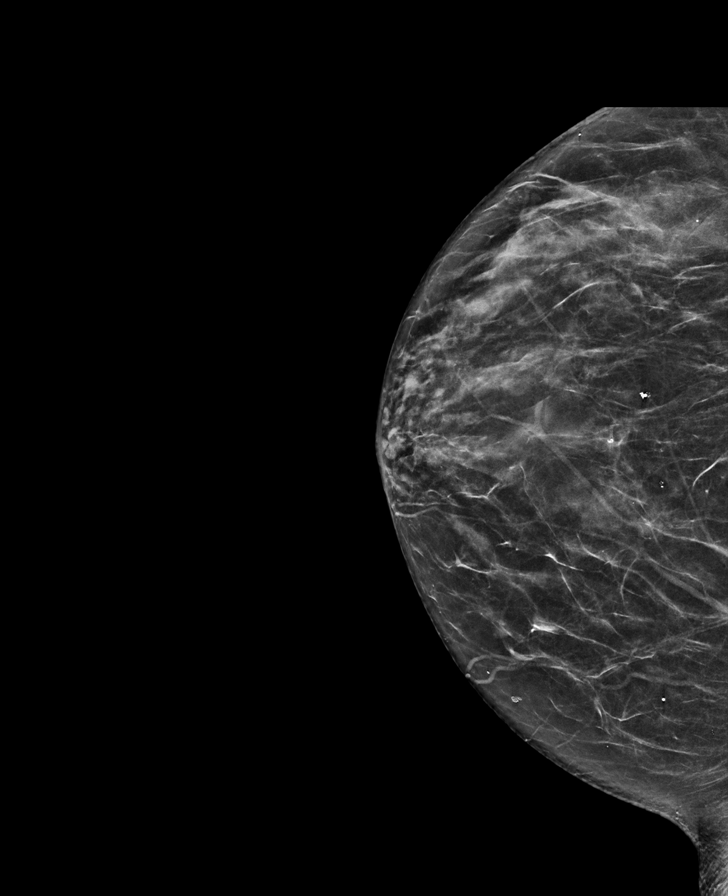

[L CC synth-2D]
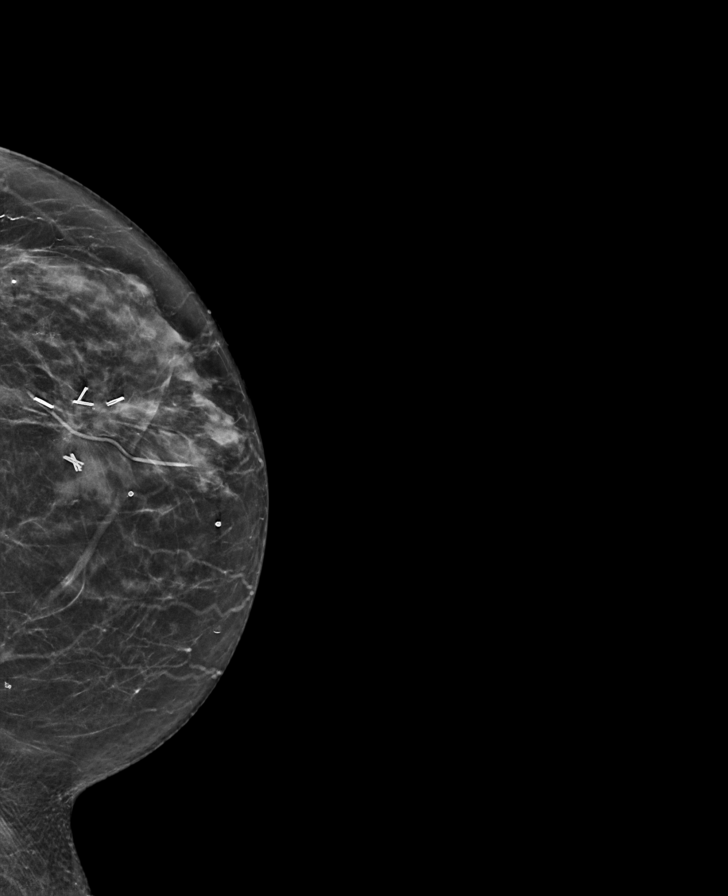

[R MLO synth-2D]
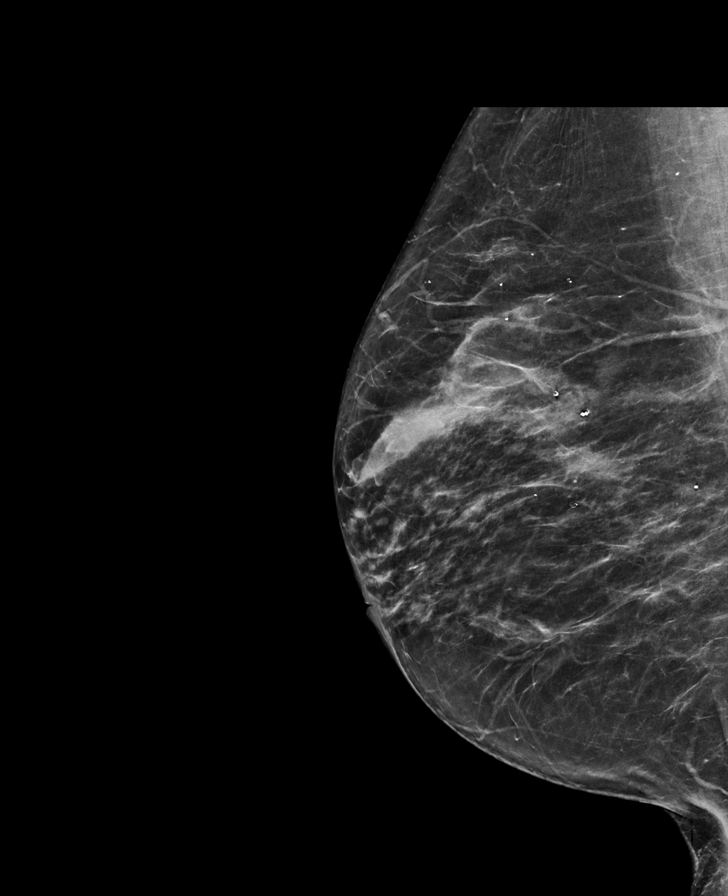

[R CC tomo · tomo slice 33/64.0]
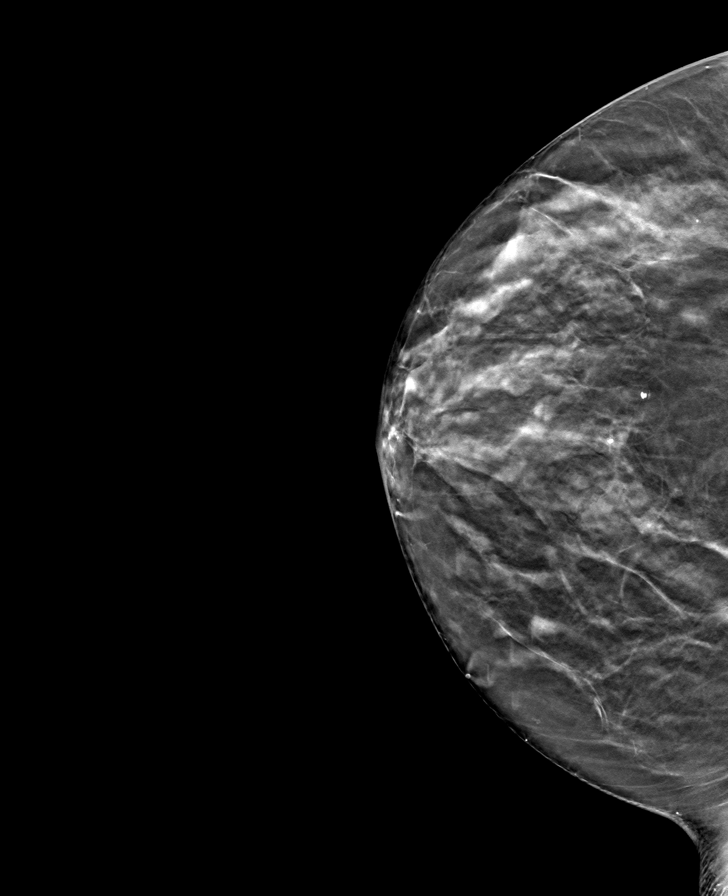

[R MLO tomo · tomo slice 35/70.0]
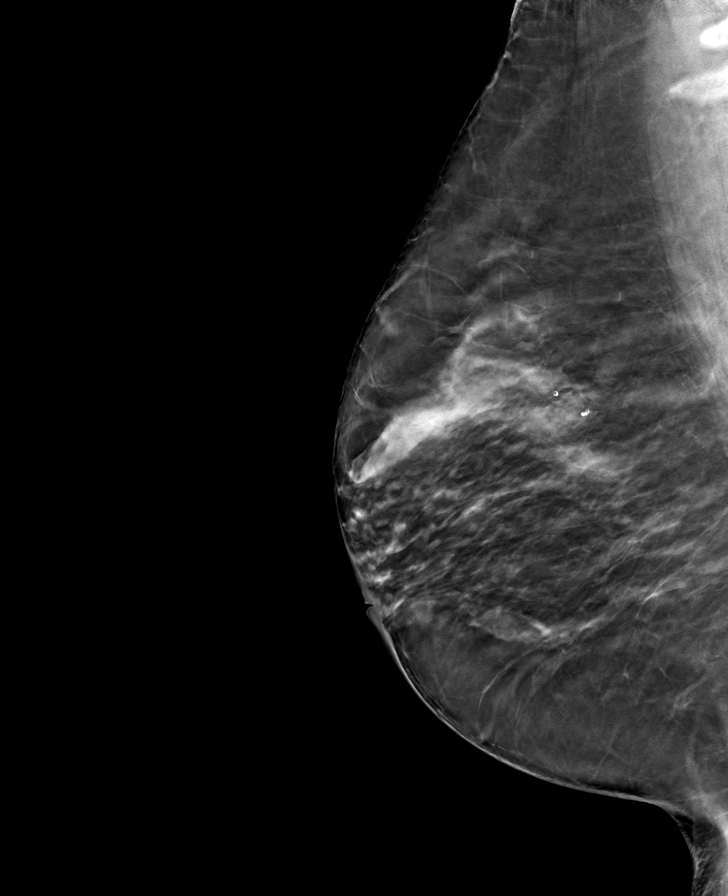

[L CC tomo · tomo slice 29/56.0]
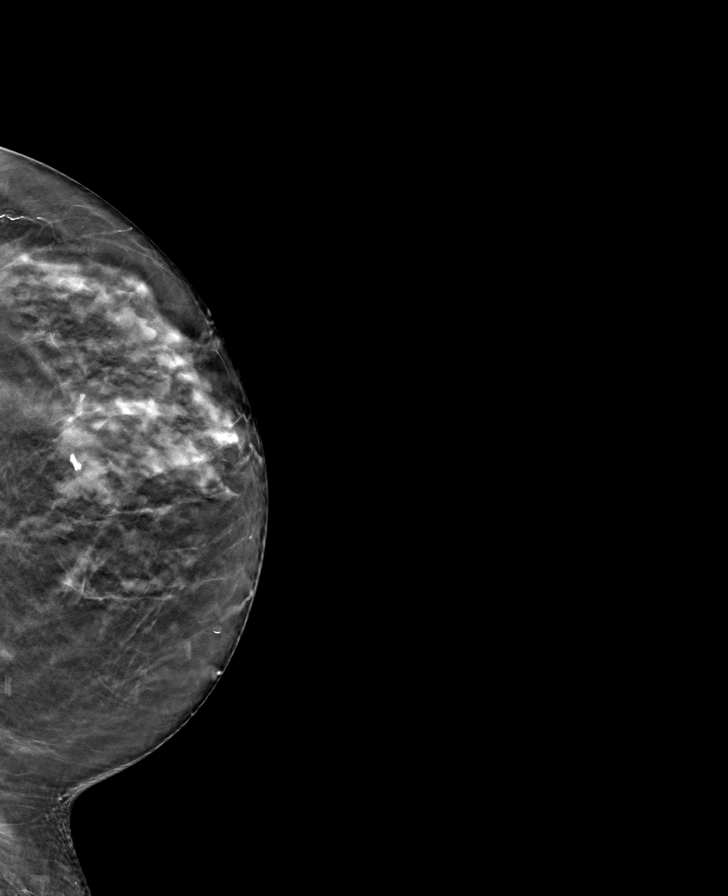

[L MLO tomo · tomo slice 31/60.0]
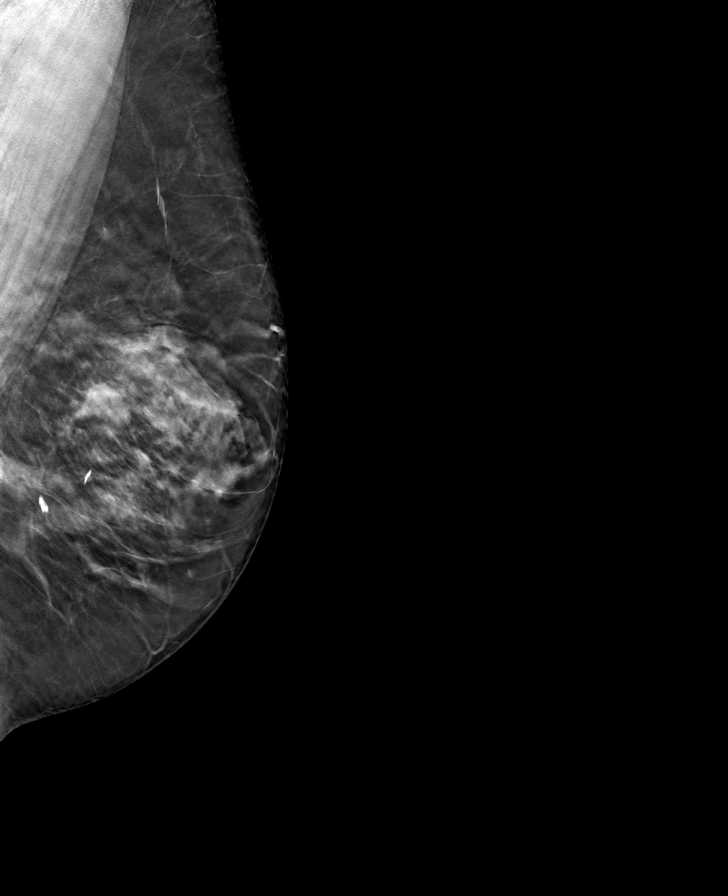

[8 of 24 positions shown; findings below may reference images not displayed]

ACR Breast Density Category c: The breast tissue is heterogeneously
dense, which may obscure small masses.
FINDINGS: In the left breast, calcifications warrant further evaluation. These
calcifications are seen within the upper-outer quadrant of the LEFT
breast. In the right breast, no findings suspicious for malignancy.
Images were processed with CAD.
IMPRESSION: Further evaluation is suggested for calcifications in the left
breast.

RECOMMENDATION:
Diagnostic mammogram of the left breast. (Code:[BC])

The patient will be contacted regarding the findings, and additional
imaging will be scheduled.

BI-RADS CATEGORY  0: Incomplete. Need additional imaging evaluation
and/or prior mammograms for comparison.

## 2020-07-21 ENCOUNTER — Other Ambulatory Visit: Payer: Self-pay

## 2020-07-21 ENCOUNTER — Ambulatory Visit (HOSPITAL_COMMUNITY): Payer: 59 | Attending: Cardiovascular Disease

## 2020-07-21 DIAGNOSIS — I10 Essential (primary) hypertension: Secondary | ICD-10-CM | POA: Diagnosis not present

## 2020-07-21 LAB — ECHOCARDIOGRAM COMPLETE
Area-P 1/2: 2.66 cm2
P 1/2 time: 442 msec
S' Lateral: 3.2 cm

## 2020-08-02 ENCOUNTER — Other Ambulatory Visit: Payer: Self-pay | Admitting: Student

## 2020-08-02 DIAGNOSIS — R928 Other abnormal and inconclusive findings on diagnostic imaging of breast: Secondary | ICD-10-CM

## 2020-08-27 ENCOUNTER — Other Ambulatory Visit: Payer: Self-pay | Admitting: Student

## 2020-08-27 ENCOUNTER — Other Ambulatory Visit: Payer: Self-pay

## 2020-08-27 ENCOUNTER — Ambulatory Visit
Admission: RE | Admit: 2020-08-27 | Discharge: 2020-08-27 | Disposition: A | Discharge status: Home / Self Care | Payer: 59 | Source: Ambulatory Visit | Attending: Student | Admitting: Student

## 2020-08-27 DIAGNOSIS — R921 Mammographic calcification found on diagnostic imaging of breast: Secondary | ICD-10-CM

## 2020-08-27 DIAGNOSIS — R928 Other abnormal and inconclusive findings on diagnostic imaging of breast: Secondary | ICD-10-CM

## 2020-08-27 IMAGING — MG DIGITAL DIAGNOSTIC UNILAT LEFT W/ CAD
5 series · 5 of 5 positions shown · non-contrast
Comparison: Previous exam(s).

CLINICAL DATA: 64-year-old female with history of remote treated
left breast cancer recalled from screening mammogram dated
[DATE] for left breast calcifications.

EXAM:
DIGITAL DIAGNOSTIC LEFT MAMMOGRAM WITH CAD

[L ML (1 of 3)]
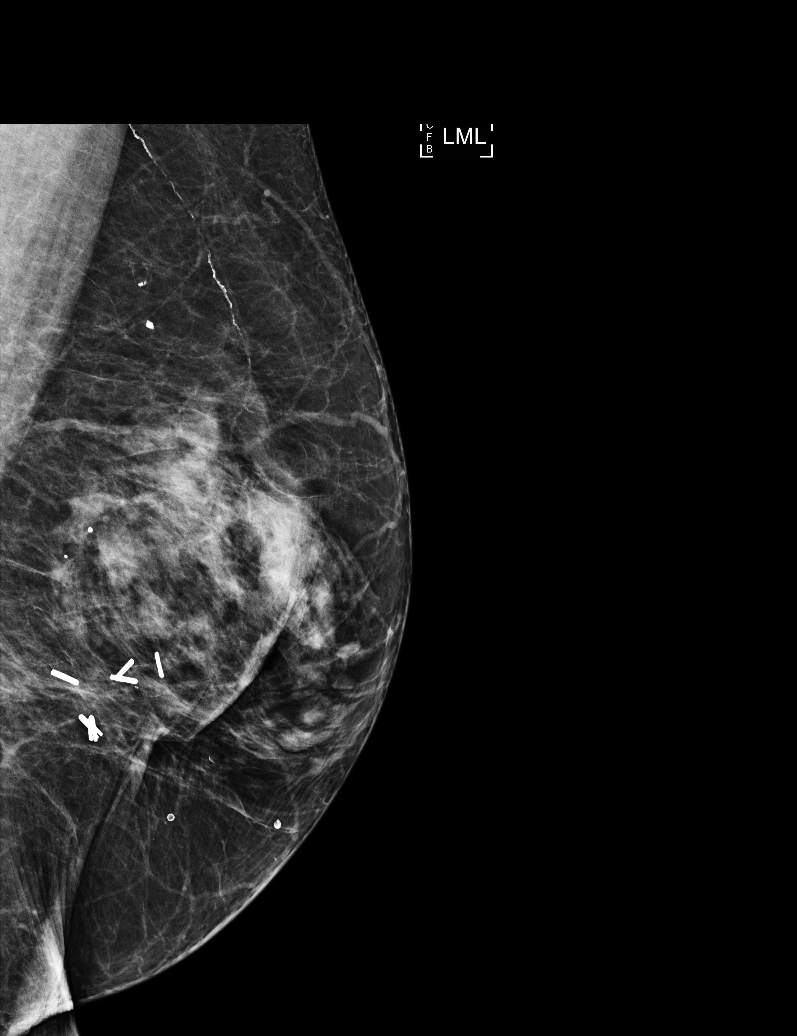

[L ML (2 of 3)]
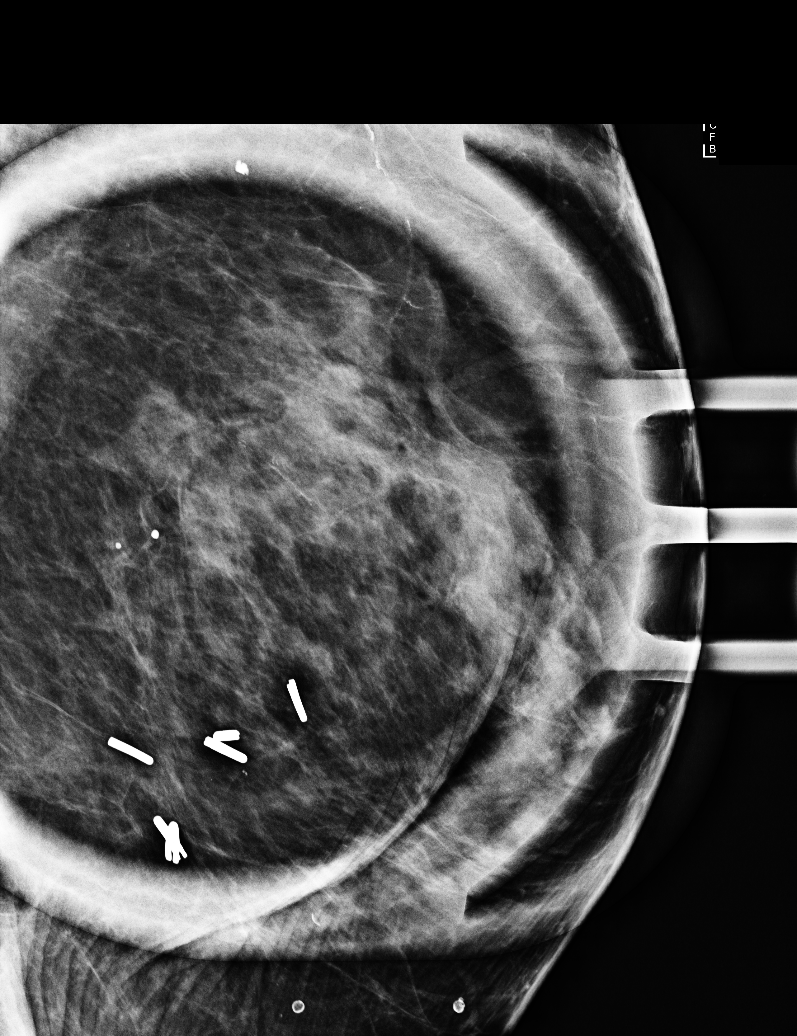

[L ML (3 of 3)]
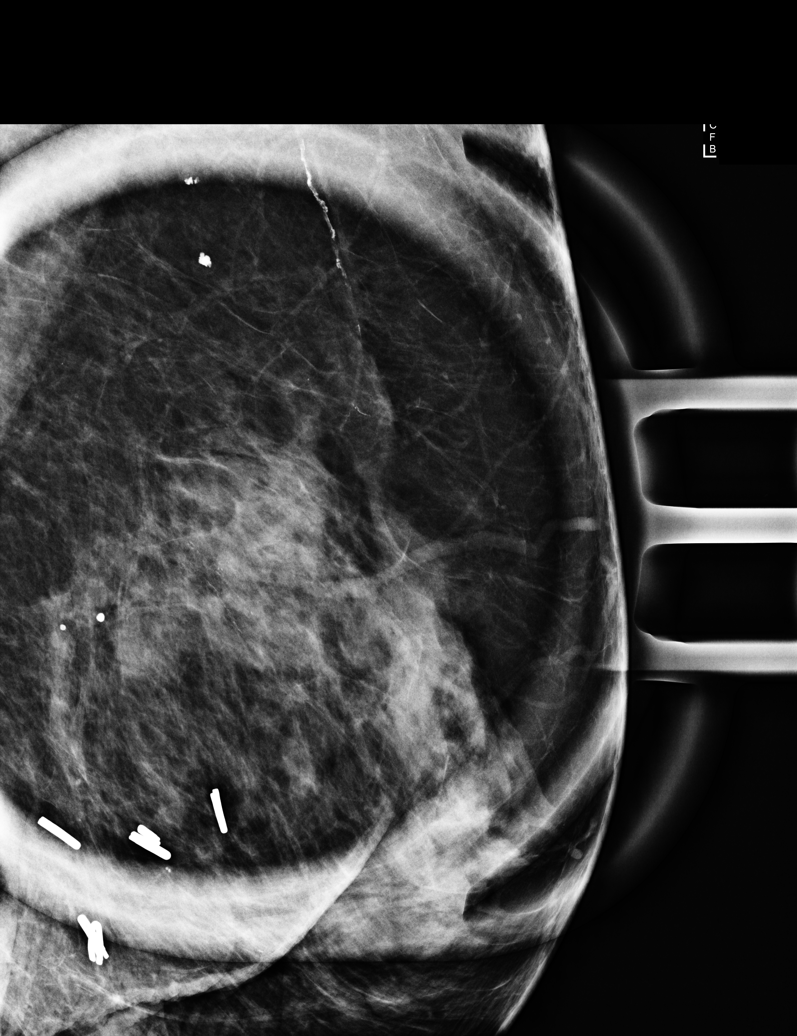

[L CC (1 of 2)]
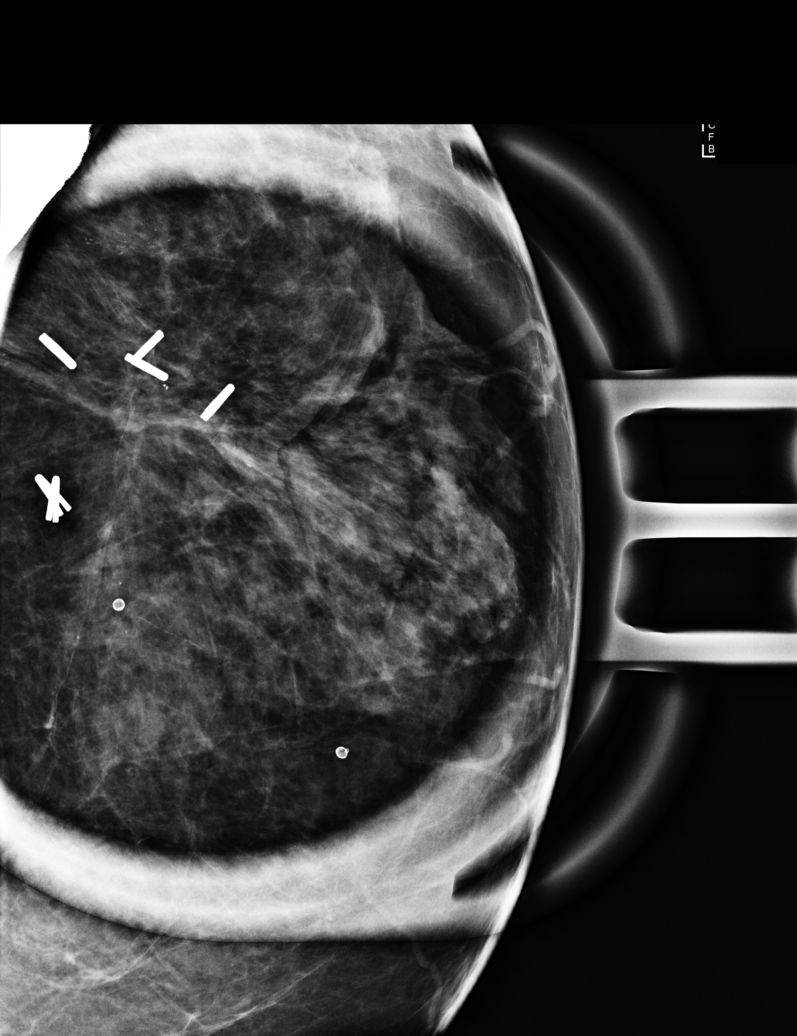

[L CC (2 of 2)]
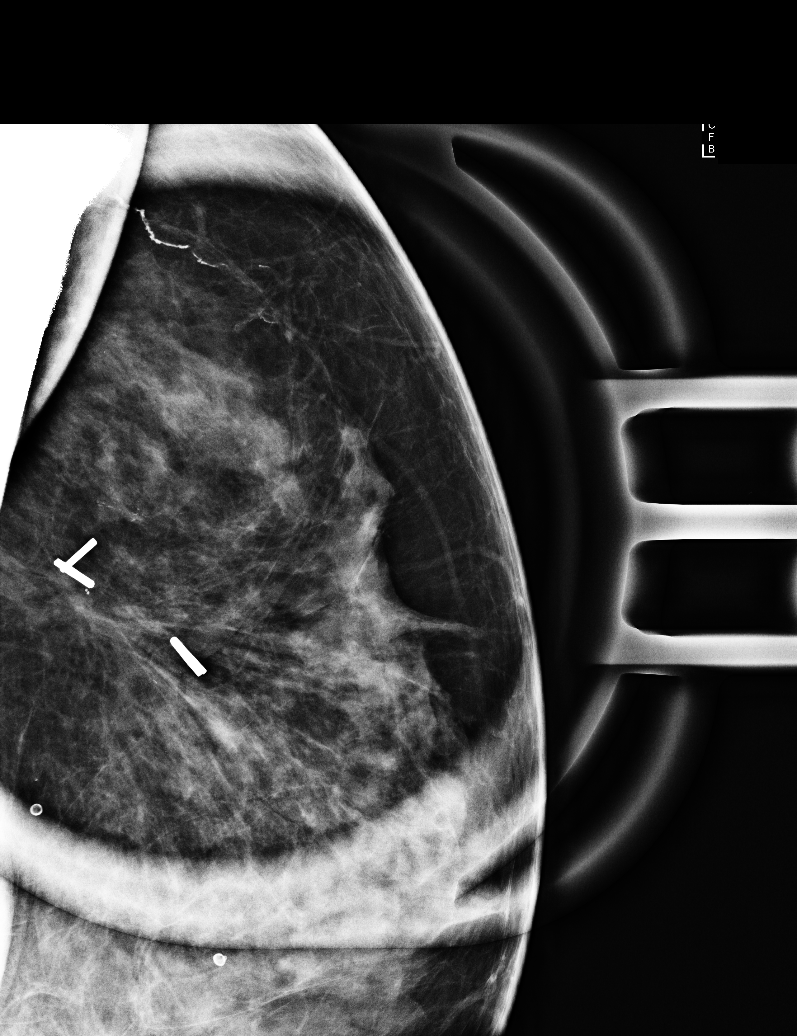

[5 of 5 positions shown; findings below may reference images not displayed]

ACR Breast Density Category c: The breast tissue is heterogeneously
dense, which may obscure small masses.
FINDINGS: Pleomorphic calcifications in a linear distribution span
approximately 1.2 cm in the upper-outer quadrant of the left breast.
These have an indeterminate morphology.

Mammographic images were processed with CAD.
IMPRESSION: Indeterminate left breast calcifications.

RECOMMENDATION:
Stereotactic biopsy of the left breast.

I have discussed the findings and recommendations with the patient.
If applicable, a reminder letter will be sent to the patient
regarding the next appointment.

BI-RADS CATEGORY  4: Suspicious.

## 2020-09-09 ENCOUNTER — Ambulatory Visit
Admission: RE | Admit: 2020-09-09 | Discharge: 2020-09-09 | Disposition: A | Discharge status: Home / Self Care | Payer: 59 | Source: Ambulatory Visit | Attending: Student | Admitting: Student

## 2020-09-09 ENCOUNTER — Other Ambulatory Visit: Payer: Self-pay

## 2020-09-09 DIAGNOSIS — R921 Mammographic calcification found on diagnostic imaging of breast: Secondary | ICD-10-CM

## 2020-09-09 IMAGING — MG MM BREAST LOCALIZATION CLIP
4 series · 4 of 12 positions shown · non-contrast
Comparison: Previous exam(s).

CLINICAL DATA: Status post stereotactic guided core needle biopsy
of a 1.2 cm group calcifications in the upper-outer quadrant of the
left breast.

EXAM:
DIAGNOSTIC LEFT MAMMOGRAM POST STEREOTACTIC BIOPSY

[L ML synth-2D]
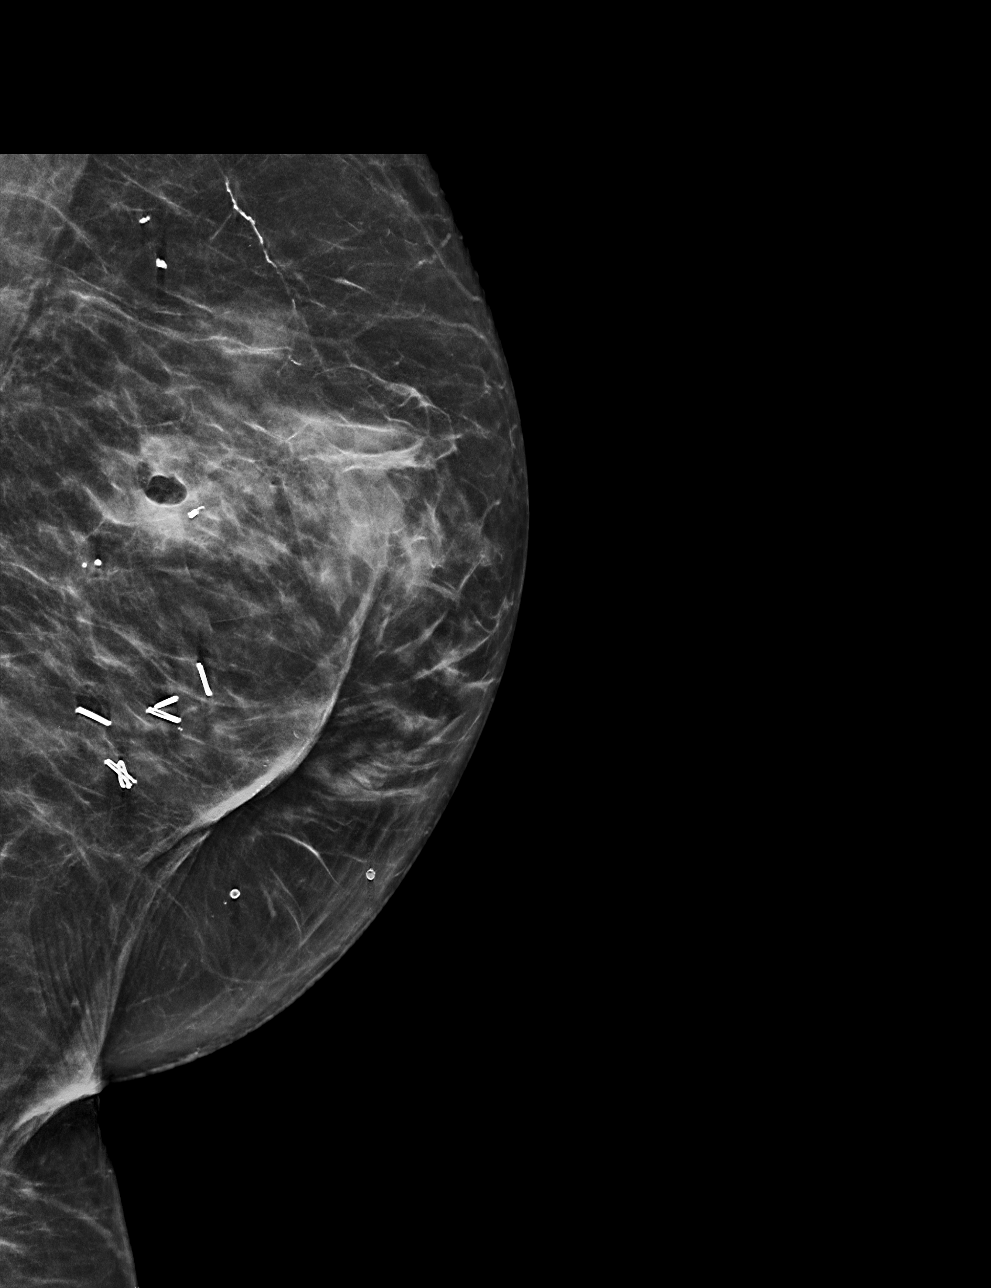

[L CC synth-2D]
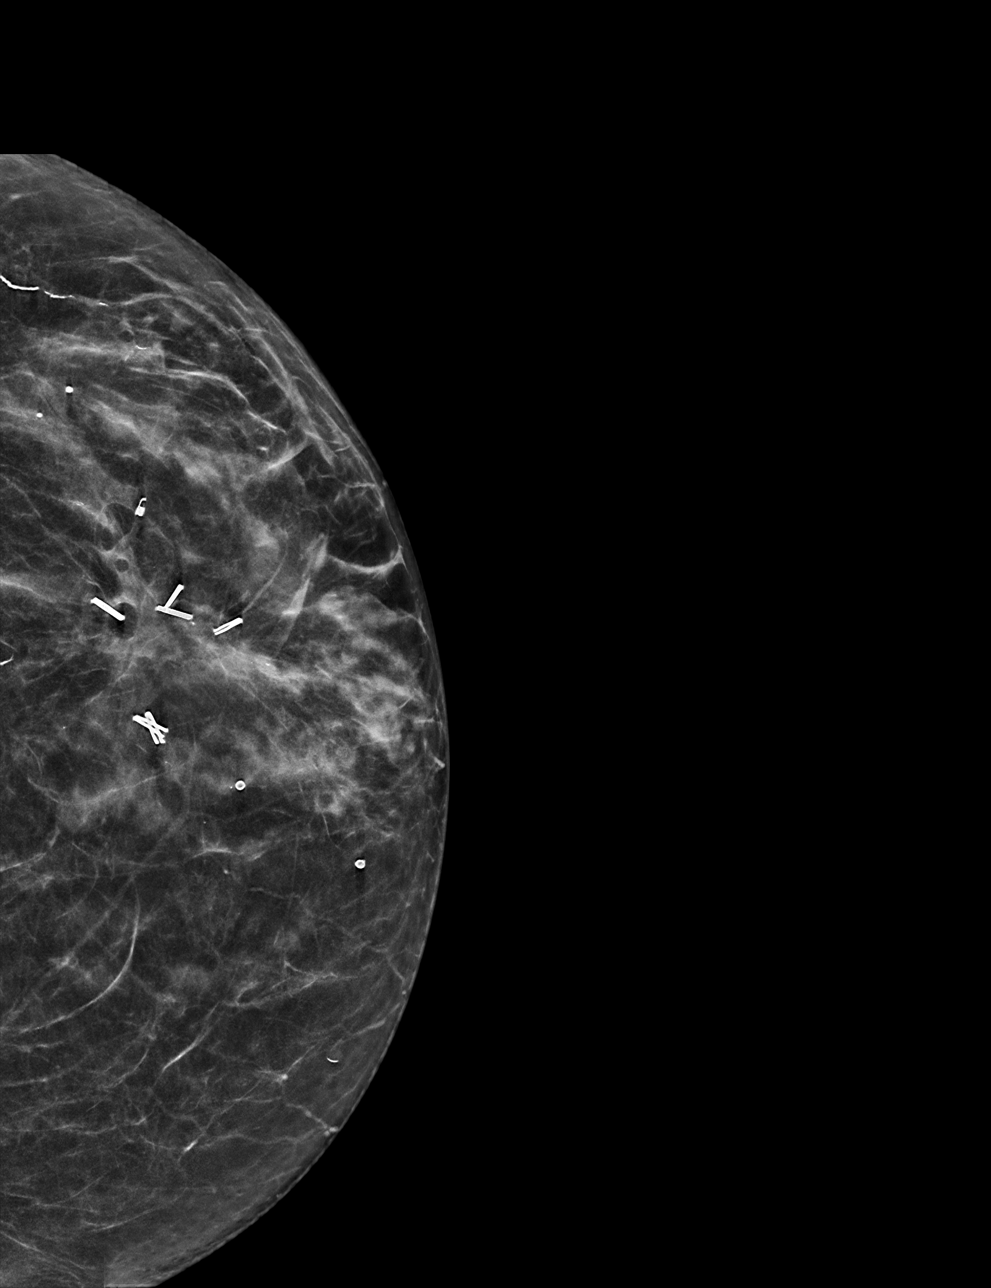

[L ML tomo · tomo slice 29/56.0]
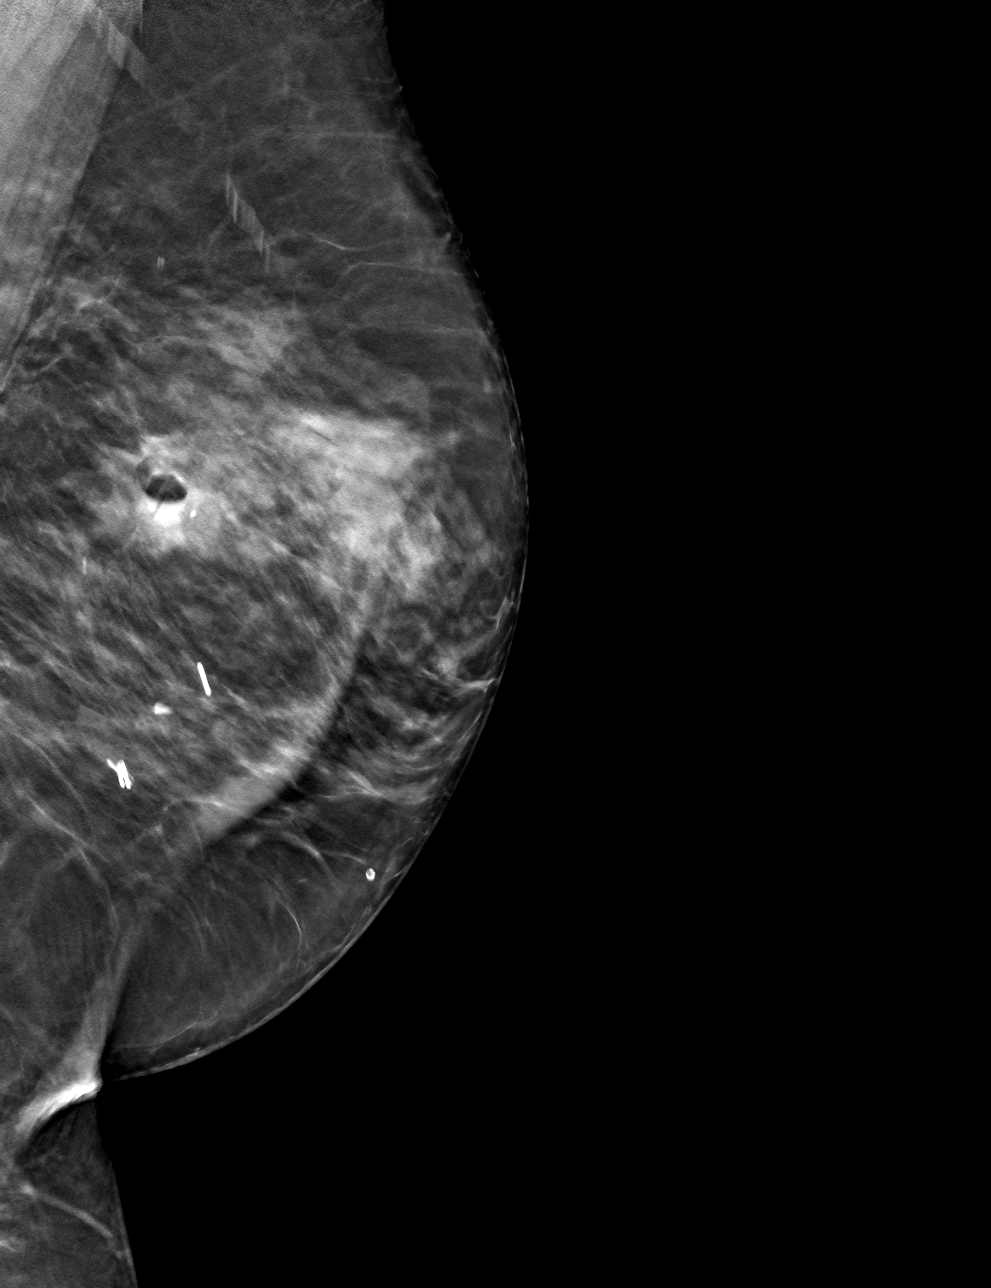

[L CC tomo · tomo slice 27/54.0]
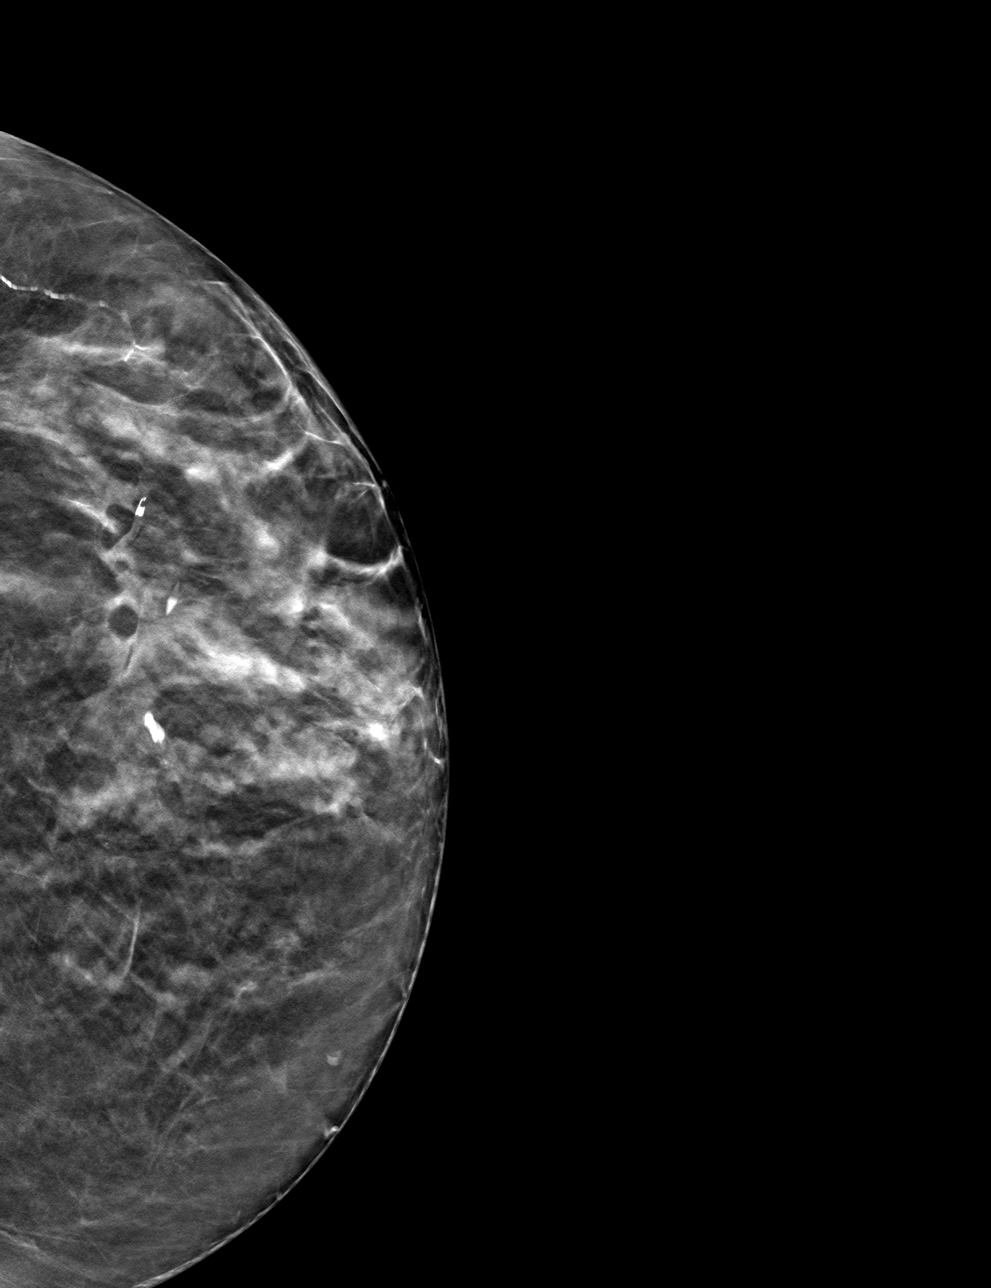

[4 of 12 positions shown; findings below may reference images not displayed]

FINDINGS: Mammographic images were obtained following stereotactic guided
biopsy of the recently demonstrated 1.2 cm group of calcifications
in the upper-outer quadrant of the left breast. The biopsy marking
clip is in expected position at the site of biopsy.
IMPRESSION: Appropriate positioning of the coil shaped biopsy marking clip at
the site of biopsy in the upper outer left breast.

Final Assessment: Post Procedure Mammograms for Marker Placement

## 2020-09-09 IMAGING — MG MM BREAST BX W LOC DEV 1ST LESION IMAGE BX SPEC STEREO GUIDE*L*
8 of 14 series · 8 of 30 positions shown · non-contrast
Comparison: Previous exams.
COMPARISON: Previous exams.

Addendum:
CLINICAL DATA: 1.2 cm group of calcifications in the upper-outer
quadrant of the left breast at recent mammography. Status post left
lumpectomy and radiation therapy for breast cancer in [KQ].

EXAM:
LEFT BREAST STEREOTACTIC CORE NEEDLE BIOPSY

[L (1 of 8)]
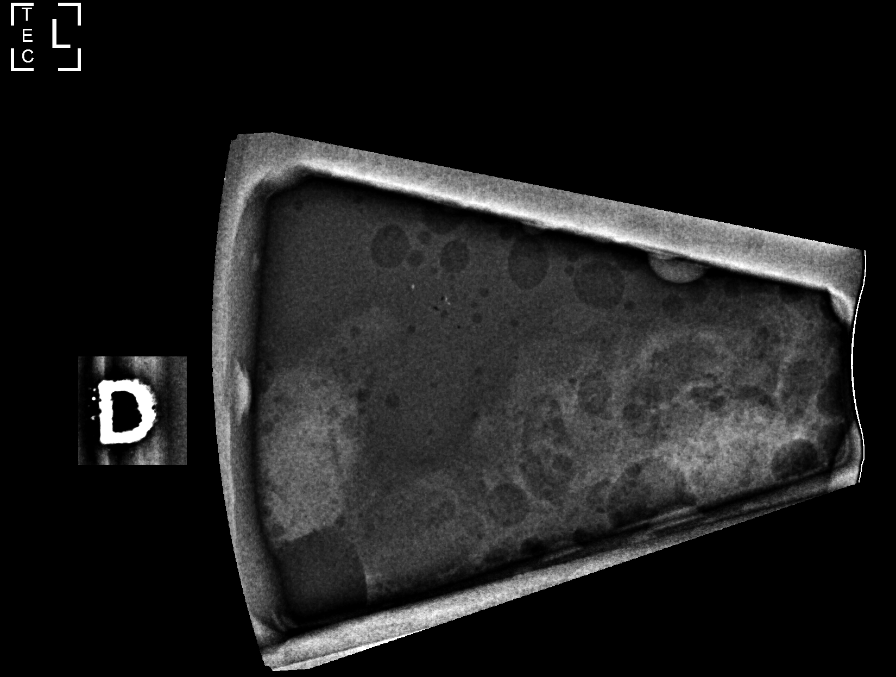

[L (2 of 8)]
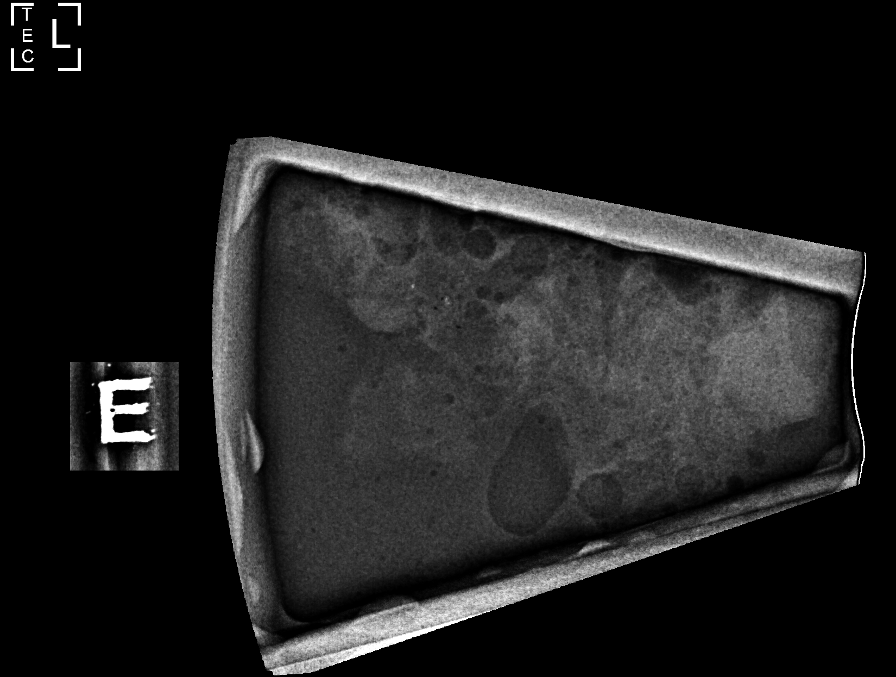

[L (3 of 8)]
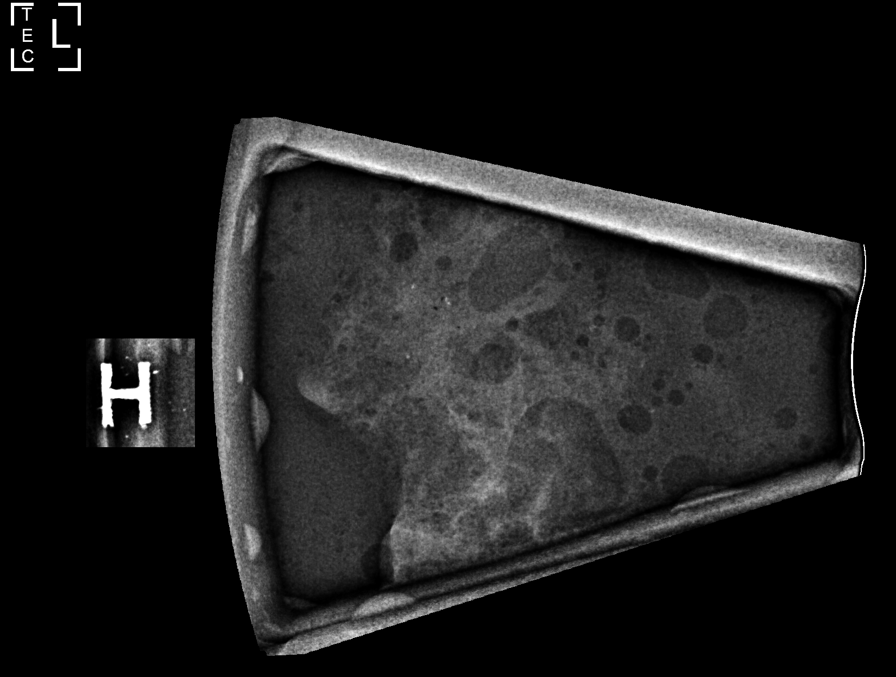

[L (4 of 8)]
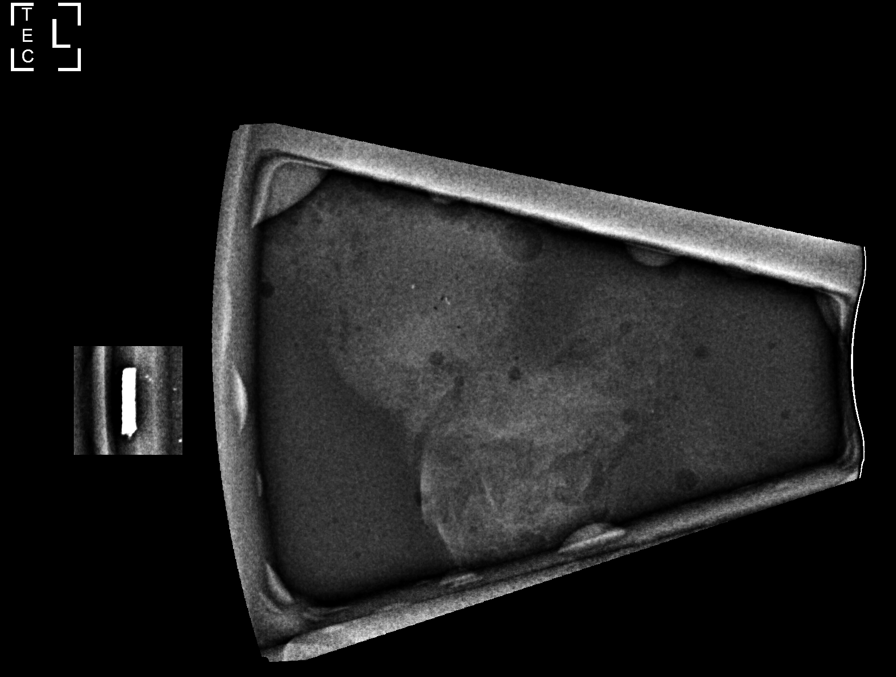

[L (5 of 8)]
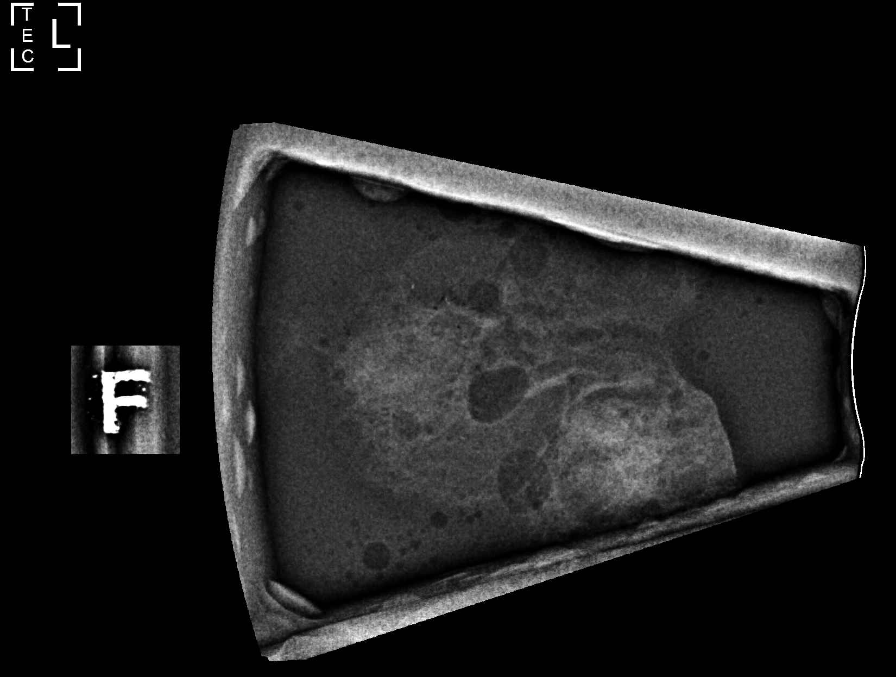

[L (6 of 8)]
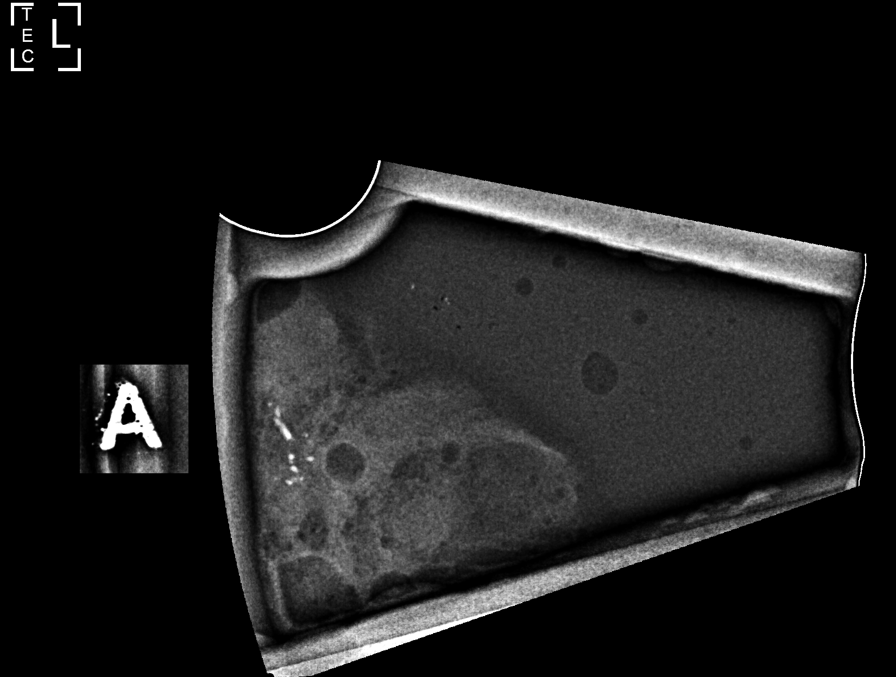

[L (7 of 8)]
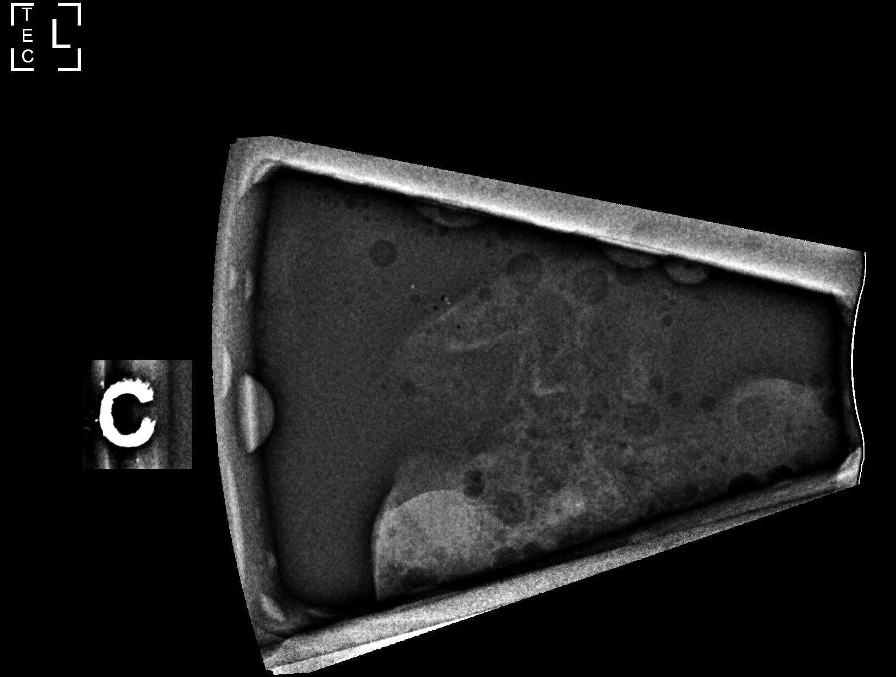

[L (8 of 8)]
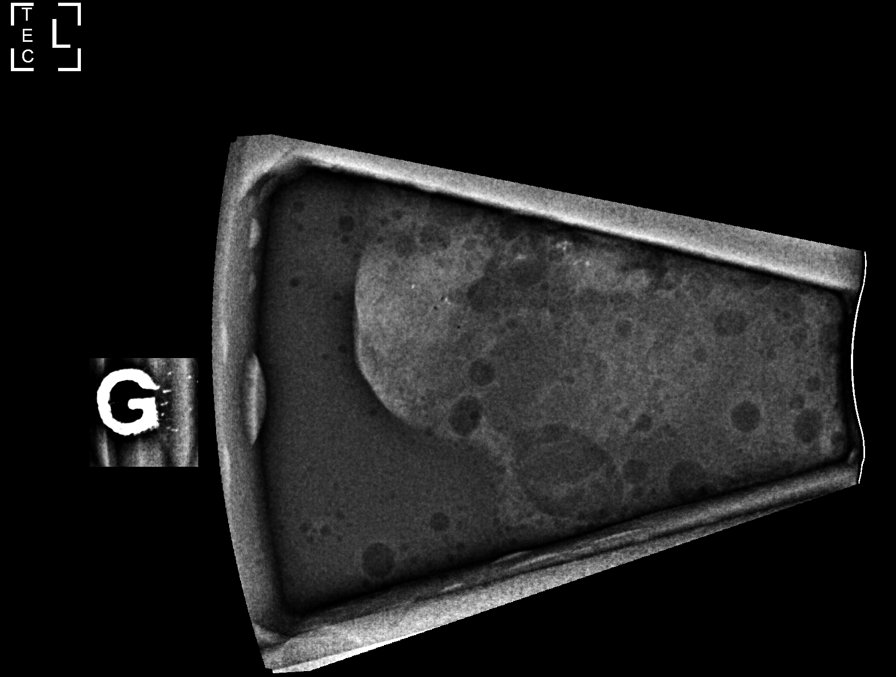

[8 of 30 positions shown; findings below may reference images not displayed]



Using sterile technique and 1% Lidocaine as local anesthetic, under
stereotactic guidance, a 9 gauge vacuum assisted device was used to
perform core needle biopsy of the recently demonstrated 1.2 cm group
of calcifications in the upper-outer quadrant of the left breast
using a lateral approach. Specimen radiograph was performed showing
multiple calcifications in the specimens. Specimens with
calcifications are identified for pathology.

Lesion quadrant: Upper outer quadrant

At the conclusion of the procedure, a coil shaped tissue marker clip
was deployed into the biopsy cavity. Follow-up 2-view mammogram was
performed and dictated separately.
IMPRESSION: Stereotactic-guided biopsy of the recently demonstrated 1.2 cm group
calcifications in the upper outer quadrant of the left breast. No
apparent complications.

ADDENDUM:
Pathology revealed DUCTAL CARCINOMA IN SITU WITH CALCIFICATIONS AND
FOCAL NECROSIS of the Left breast, upper outer. This was found to be
concordant by Dr. SAPARILLA.

Pathology results were discussed with the patient by telephone. The
patient reported doing well after the biopsy with tenderness at the
site. Post biopsy instructions and care were reviewed and questions
were answered. The patient was encouraged to call The [REDACTED] for any additional concerns. My direct phone
number was provided.

Surgical consultation has been arranged with Dr. SAPARILLA at
[REDACTED] on [DATE].

Pathology results reported by SAPARILLA, RN on [DATE].



Using sterile technique and 1% Lidocaine as local anesthetic, under
stereotactic guidance, a 9 gauge vacuum assisted device was used to
perform core needle biopsy of the recently demonstrated 1.2 cm group
of calcifications in the upper-outer quadrant of the left breast
using a lateral approach. Specimen radiograph was performed showing
multiple calcifications in the specimens. Specimens with
calcifications are identified for pathology.

Lesion quadrant: Upper outer quadrant

At the conclusion of the procedure, a coil shaped tissue marker clip
was deployed into the biopsy cavity. Follow-up 2-view mammogram was
performed and dictated separately.
IMPRESSION: Stereotactic-guided biopsy of the recently demonstrated 1.2 cm group
calcifications in the upper outer quadrant of the left breast. No
apparent complications.

## 2020-09-24 ENCOUNTER — Ambulatory Visit: Payer: Self-pay | Admitting: Surgery

## 2020-09-24 ENCOUNTER — Other Ambulatory Visit: Payer: Self-pay | Admitting: Surgery

## 2020-09-24 DIAGNOSIS — D0512 Intraductal carcinoma in situ of left breast: Secondary | ICD-10-CM

## 2020-09-24 NOTE — H&P (Signed)
History of Present Illness Imogene Burn. Janika Jedlicka MD; 09/24/2020 9:02 PM) The patient is a 64 year old female who presents with breast cancer. PCP - Dr. Dustin Folks Referred by Esther Hardy, NP for left DCIS  This is a 64 year old female with an extensive history who presents with a recent diagnosis of left breast cancer. She is a moderately poor historian as she does not remember many details about her previous cancer treatment. She is accompanied by one of her daughters who helps to fill in some details, but we have no records from her previous cancer treatment.  This patient has a maternal grandmother, mother, sister, and daughter who have all had breast cancer. Apparently, when her daughter was diagnosed with breast cancer in 2009, the patient was living in [place]. She thinks she had some genetic testing but is unaware of any results from that testing.   In 2000, the patient was living near [place], [place] and underwent surgery at [facility] for a left breast cancer. Apparently, she had a left lumpectomy and left axillary lymph node dissection (10-11 nodes). Subsequently, she underwent chemo and radiation at [facility] in [place]. We have none of those records. The patient has some residual peripheral neuropathy.  Recently, the patient underwent screening mammogram that showed some suspicious microcalcifications in the left upper outer quadrant spanning 1.2 cm.She underwent biopsy of this area which revealed DCIS with calcifications and focal necrosis ER/PR positive. She comes in today for her initial visit. She began the visit by stating "I want both breasts gone."   Problem List/Past Medical Imogene Burn. Kenasia Scheller, MD; 09/24/2020 9:02 PM) DUCTAL CARCINOMA IN SITU (DCIS) OF LEFT BREAST (D05.12) HISTORY OF LEFT BREAST CANCER (Z85.3)  Past Surgical History (Chanel Teressa Senter, CMA; 09/24/2020 11:43 AM) Breast Biopsy Left. multiple Breast Mass;  Local Excision Left. Gallbladder Surgery - Open Sentinel Lymph Node Biopsy Spinal Surgery - Lower Back  Diagnostic Studies History (Chanel Teressa Senter, CMA; 09/24/2020 11:43 AM) Colonoscopy 1-5 years ago Mammogram within last year Pap Smear 1-5 years ago  Allergies ([place], CMA; 09/24/2020 11:43 AM) No Known Drug Allergies [09/24/2020]: Allergies Reconciled  Medication History (Chanel Teressa Senter, CMA; 09/24/2020 11:45 AM) Atorvastatin Calcium (10MG  Tablet, Oral) Active. hydroCHLOROthiazide (25MG  Tablet, Oral) Active. Omeprazole (20MG  Capsule DR, Oral) Active. Pregabalin (200MG  Capsule, Oral) Active. Medications Reconciled  Social History (Chanel Teressa Senter, CMA; 09/24/2020 11:43 AM) Caffeine use Carbonated beverages, Coffee, Tea. No alcohol use No drug use Tobacco use Never smoker.  Family History Antonietta Jewel, [place]; 09/24/2020 11:43 AM) Alcohol Abuse Father. Anesthetic complications Daughter. Arthritis Brother, Daughter, Father, Mother, Sister. Bleeding disorder Daughter, Sister. Breast Cancer Daughter, Mother, Sister. Cerebrovascular Accident Brother, Mother, Sister. Cervical Cancer Daughter. Diabetes Mellitus Brother, Mother, Sister. Heart Disease Brother, Father, Mother, Sister. Heart disease in female family member before age 39 Heart disease in female family member before age 36 Hypertension Brother, Father, Mother, Sister. Kidney Disease Brother, Mother. Melanoma Mother. Seizure disorder Brother.  Pregnancy / Birth History Antonietta Jewel, CMA; 09/24/2020 11:43 AM) Age of menopause 52-50 Gravida 4 Irregular periods Maternal age 49-20 Para 4  Other Problems Imogene Burn. Shaneya Taketa, MD; 09/24/2020 9:02 PM) Arthritis Back Pain Breast Cancer Diabetes Mellitus Gastric Ulcer Gastroesophageal Reflux Disease Hypercholesterolemia Lump In Breast Transfusion history     Review of Systems (Chanel Nolan CMA; 09/24/2020 11:43  AM) General Not Present- Appetite Loss, Chills, Fatigue, Fever, Night Sweats, Weight Gain and Weight Loss. Skin Not Present- Change in Wart/Mole, Dryness, Hives, Jaundice, New Lesions,  Non-Healing Wounds, Rash and Ulcer. HEENT Present- Wears glasses/contact lenses. Not Present- Earache, Hearing Loss, Hoarseness, Nose Bleed, Oral Ulcers, Ringing in the Ears, Seasonal Allergies, Sinus Pain, Sore Throat, Visual Disturbances and Yellow Eyes. Respiratory Present- Snoring. Not Present- Bloody sputum, Chronic Cough, Difficulty Breathing and Wheezing. Breast Present- Breast Mass. Not Present- Breast Pain, Nipple Discharge and Skin Changes. Cardiovascular Present- Swelling of Extremities. Not Present- Chest Pain, Difficulty Breathing Lying Down, Leg Cramps, Palpitations, Rapid Heart Rate and Shortness of Breath. Gastrointestinal Not Present- Abdominal Pain, Bloating, Bloody Stool, Change in Bowel Habits, Chronic diarrhea, Constipation, Difficulty Swallowing, Excessive gas, Gets full quickly at meals, Hemorrhoids, Indigestion, Nausea, Rectal Pain and Vomiting. Female Genitourinary Present- Nocturia. Not Present- Frequency, Painful Urination, Pelvic Pain and Urgency. Musculoskeletal Present- Back Pain, Joint Pain, Joint Stiffness and Swelling of Extremities. Not Present- Muscle Pain and Muscle Weakness. Psychiatric Not Present- Anxiety, Bipolar, Change in Sleep Pattern, Depression, Fearful and Frequent crying. Endocrine Present- Cold Intolerance and Hot flashes. Not Present- Excessive Hunger, Hair Changes, Heat Intolerance and New Diabetes. Hematology Present- Blood Thinners and Easy Bruising. Not Present- Excessive bleeding, Gland problems, HIV and Persistent Infections.  Vitals (Chanel Nolan CMA; 09/24/2020 11:45 AM) 09/24/2020 11:45 AM Weight: 226.25 lb Height: 63in Body Surface Area: 2.04 m Body Mass Index: 40.08 kg/m  Temp.: 97.74F  Pulse: 101 (Regular)         Physical Exam Rodman Key K.  Saki Legore MD; 09/24/2020 9:04 PM)  The physical exam findings are as follows: Note:Constitutional: WDWN in NAD, conversant, no obvious deformities; resting comfortably Eyes: Pupils equal, round; sclera anicteric; moist conjunctiva; no lid lag HENT: Oral mucosa moist; good dentition Neck: No masses palpated, trachea midline; no thyromegaly Lungs: CTA bilaterally; normal respiratory effort Breasts: right breast - mild fibrocystic changes; no dominant masses; chronic nipple retraction; no nipple discharge; no lymphadenopathy left breast - contracted lumpectomy scar with slight deformity; chronic nipple retraction; firmness in LUOQ spanning 2.5 cm. The patient states that this has been there a long time. Healed left axillary incision with no palpable lymphadenopathy CV: Regular rate and rhythm; no murmurs; extremities well-perfused with no edema Abd: +bowel sounds, obese, soft, non-tender, no palpable organomegaly; no palpable hernias Musc: Normal gait; no apparent clubbing or cyanosis in extremities Lymphatic: No palpable cervical or axillary lymphadenopathy Skin: Warm, dry; no sign of jaundice Psychiatric - alert and oriented x 4; calm mood and affect    Assessment & Plan Rodman Key K. Bera Pinela MD; 09/24/2020 9:09 PM)  DUCTAL CARCINOMA IN SITU (DCIS) OF LEFT BREAST (D05.12)  Current Plans Referred to Oncology, for evaluation and follow up (Oncology). Urgent. Referred to Genetic Counseling, for evaluation and follow up [employer]). Urgent.  HISTORY OF LEFT BREAST CANCER (Z85.3) Impression: 2001 - [place], [place] - treated at [facility] with chemo/ radiation Lumpectomy/ ALND Records pending  Current Plans MRI, BOTH BREASTS [phone]) Instructed to make follow-up appointment for office visit following completion of diagnostic tests Note:This is a complex situation, made more complex by the lack of history and records.   I spent about 40 minutes with the patient and her daughter  explaining her situation and the recommended plan. We need to obtain her previous treatment records, which may affect our current treatment recommendations. We will obtain a breast MRI to evaluate the right breast. She will very likely need at least a left mastectomy +/- sentinel lymph node biopsy (in the presence of previous ALND). MRI may reveal suspicious findings in the right which may require further evaluation. We will  refer her urgently to Oncology for evaluation and possible pre-operative initiation of Tamoxifen while we are completing her work-up.   We will also refer her to Genetic counseling. Testing has improved since 2009 and if we cannot obtain those records from [place], she may benefit from further genetic testing. This may also affect our decision regarding possible right prophylactic mastectomy.  We will discuss this further with the patient after MRI, Oncology, and genetics.  Imogene Burn. Georgette Dover, MD, [facility]  General/ Trauma Surgery   09/24/2020 9:10 PM

## 2020-09-27 ENCOUNTER — Telehealth: Payer: Self-pay | Admitting: Hematology and Oncology

## 2020-09-27 NOTE — Telephone Encounter (Signed)
Received referrals from Dr. Georgette Dover for medical oncology and genetics for breast cancer. Candice Maldonado returned my call and has been scheduled to see Candice Maldonado on 12/22 at 4pm and [name] for urgent genetics on 12/29 at 11am. Pt aware to arrive 15 minutes early.

## 2020-09-28 ENCOUNTER — Other Ambulatory Visit: Payer: Self-pay

## 2020-09-28 ENCOUNTER — Ambulatory Visit
Admission: EM | Admit: 2020-09-28 | Discharge: 2020-09-28 | Disposition: A | Discharge status: Home / Self Care | Payer: 59 | Attending: Emergency Medicine | Admitting: Emergency Medicine

## 2020-09-28 DIAGNOSIS — S161XXA Strain of muscle, fascia and tendon at neck level, initial encounter: Secondary | ICD-10-CM | POA: Diagnosis not present

## 2020-09-28 DIAGNOSIS — M546 Pain in thoracic spine: Secondary | ICD-10-CM

## 2020-09-28 MED ORDER — MELOXICAM 7.5 MG PO TABS
7.5000 mg | ORAL_TABLET | Freq: Every day | ORAL | 0 refills | Status: DC
Start: 2020-09-28 — End: 2021-08-03

## 2020-09-28 MED ORDER — KETOROLAC TROMETHAMINE 30 MG/ML IJ SOLN
30.0000 mg | Freq: Once | INTRAMUSCULAR | Status: AC
  Administered 2020-09-28: 15:00:00 30 mg via INTRAMUSCULAR

## 2020-09-28 MED ORDER — TIZANIDINE HCL 4 MG PO TABS
4.0000 mg | ORAL_TABLET | Freq: Four times a day (QID) | ORAL | 0 refills | Status: DC | PRN
Start: 2020-09-28 — End: 2022-12-14

## 2020-09-28 NOTE — Discharge Instructions (Addendum)
We gave an injection of Toradol today Continue with Mobic daily with food-please use instead of ibuprofen/Naprosyn May supplement tizanidine at home/bedtime- -Use as alternative to baclofen Alternate ice and heat Gentle stretching of neck Follow-up if not improving or worsening

## 2020-09-28 NOTE — ED Triage Notes (Signed)
Pt states she was folding clothes yesterday and felt a cramp to lt side of neck. States put a heating pad on her neck and pain became worse and now in lt shoulder.

## 2020-09-28 NOTE — ED Provider Notes (Signed)
[facility]    CSN: [id number] Arrival date & time: 09/28/20  1357      History   Chief Complaint Chief Complaint  Patient presents with  . Neck Pain    HPI Candice Maldonado is a 64 y.o. female history of diabetes presenting today for evaluation of neck and shoulder pain.  Patient reports that she was folding clothes yesterday and had some slight discomfort in her left neck and back, while folding clothes felt an increase chorea into her back and shoulder area.  Has been using topical medicines and heating pad without relief.  Also has regularly prescribed hydrocodone, baclofen, Lyrica and Naprosyn which has not been providing relief.  She does report she had chronic back pain and has plans to have upcoming insurer spinal injections in 2 days.  She denies any associated vision changes.  Denies dizziness or lightheadedness.  Pain does take breath away, but denies difficulty breathing or shortness of breath  HPI  Past Medical History:  Diagnosis Date  . Arthritis   . Breast cancer ([place]) 2000  . Colon polyps 2015  . Diabetes ([place])   . Gallstones   . Obesity   . Personal history of chemotherapy   . Personal history of radiation therapy     There are no problems to display for this patient.   Past Surgical History:  Procedure Laterality Date  . BACK SURGERY  2011  . BREAST LUMPECTOMY    . CHOLECYSTECTOMY  1980  . TUBAL LIGATION  1990    OB History   No obstetric history on file.      Home Medications    Prior to Admission medications   Medication Sig Start Date End Date Taking? Authorizing Provider  ascorbic acid (VITAMIN C) 500 MG tablet Take 500 mg by mouth daily.    [provider]  atorvastatin (LIPITOR) 10 MG tablet Take 10 mg by mouth daily.    [provider]  baclofen (LIORESAL) 10 MG tablet Take 10 mg by mouth as needed for muscle spasms.    [provider]  Black Cohosh 40 MG CAPS Take 1 capsule by mouth daily.     [provider]  hydrochlorothiazide (HYDRODIURIL) 25 MG tablet Take 25 mg by mouth daily.    [provider]  HYDROcodone-acetaminophen (NORCO) 7.5-325 MG tablet Take 1 tablet by mouth every 6 (six) hours as needed for moderate pain.    [provider]  meloxicam (MOBIC) 7.5 MG tablet Take 1 tablet (7.5 mg total) by mouth daily. Take in the morning, with food. 09/28/20   Perpetua Elling C, PA-C  Multiple Vitamin (MULTIVITAMIN) tablet Take 1 tablet by mouth daily.    [provider]  naproxen (NAPROSYN) 500 MG tablet Take 500 mg by mouth 2 (two) times daily with a meal.    [provider]  omeprazole (PRILOSEC) 20 MG capsule Take 20 mg by mouth daily as needed.    [provider]  pregabalin (LYRICA) 200 MG capsule Take 200 mg by mouth 3 (three) times daily.    [provider]  pyridOXINE (VITAMIN B-6) 100 MG tablet Take 100 mg by mouth daily.    [provider]  tiZANidine (ZANAFLEX) 4 MG tablet Take 1 tablet (4 mg total) by mouth every 6 (six) hours as needed for muscle spasms. 09/28/20   Saralee Bolick C, PA-C  vitamin B-12 (CYANOCOBALAMIN) 1000 MCG tablet Take 1,000 mcg by mouth daily.    [provider]  Family History Family History  Problem Relation Age of Onset  . Breast cancer Mother   . Diabetes Mother   . Diabetes Sister   . Diabetes Brother   . Breast cancer Maternal Grandmother   . Irritable bowel syndrome Daughter   . Breast cancer Daughter     Social History Social History   Tobacco Use  . Smoking status: Never Smoker  . Smokeless tobacco: Current User  Substance Use Topics  . Alcohol use: Never  . Drug use: Never     Allergies   Tape   Review of Systems Review of Systems  Constitutional: Negative for fatigue and fever.  Eyes: Negative for visual disturbance.  Respiratory: Negative for shortness of breath.   Cardiovascular: Negative for chest pain.  Gastrointestinal:  Negative for abdominal pain, nausea and vomiting.  Musculoskeletal: Positive for back pain, myalgias and neck pain. Negative for arthralgias and joint swelling.  Skin: Negative for color change, rash and wound.  Neurological: Negative for dizziness, weakness, light-headedness and headaches.     Physical Exam Triage Vital Signs ED Triage Vitals  Enc Vitals Group     BP      Pulse      Resp      Temp      Temp src      SpO2      Weight      Height      Head Circumference      Peak Flow      Pain Score      Pain Loc      Pain Edu?      Excl. in [place]?    No data found.  Updated Vital Signs BP 96/69 (BP Location: Left Arm)   Pulse (!) 105   Temp 98.8 F (37.1 C) (Oral)   Resp 18   SpO2 96%   Visual Acuity Right Eye Distance:   Left Eye Distance:   Bilateral Distance:    Right Eye Near:   Left Eye Near:    Bilateral Near:     Physical Exam Vitals and nursing note reviewed.  Constitutional:      Appearance: She is well-developed and well-nourished.     Comments: Lying on right side, appears in pain  HENT:     Head: Normocephalic and atraumatic.     Nose: Nose normal.  Eyes:     Conjunctiva/sclera: Conjunctivae normal.     Comments: Wearing glasses  Cardiovascular:     Rate and Rhythm: Normal rate and regular rhythm.  Pulmonary:     Effort: Pulmonary effort is normal. No respiratory distress.     Comments: Breathing comfortably at rest, CTABL, no wheezing, rales or other adventitious sounds auscultated Abdominal:     General: There is no distension.  Musculoskeletal:        General: Normal range of motion.     Cervical back: Neck supple.     Comments: Nontender to palpation along cervical spine midline, mild tenderness to palpation along superior thoracic paraspinal musculature into superior trapezius area, is able to move neck towards left, but elicits pain  Full active range of motion of left shoulder  Skin:    General: Skin is warm and dry.   Neurological:     Mental Status: She is alert and oriented to person, place, and time.  Psychiatric:        Mood and Affect: Mood and affect normal.      UC Treatments / Results  Labs (all  labs ordered are listed, but only abnormal results are displayed) Labs Reviewed - No data to display  EKG   Radiology No results found.  Procedures Procedures (including critical care time)  Medications Ordered in UC Medications  ketorolac (TORADOL) 30 MG/ML injection 30 mg (30 mg Intramuscular Given 09/28/20 1506)    Initial Impression / Assessment and Plan / UC Course  I have reviewed the triage vital signs and the nursing notes.  Pertinent labs & imaging results that were available during my care of the patient were reviewed by me and considered in my medical decision making (see chart for details).     Left neck back shoulder pain without mechanism of injury.  Suspect most likely muscular etiology.  No associated systemic symptoms with pain.  Treating with Toradol and continuing on anti-inflammatories, deferring oral steroids given upcoming intraspinal injections which are likely steroids.  Providing alternative NSAID to Naprosyn and alternative muscle relaxer to baclofen.  Discussed activity modification, alternate ice and heat and gentle stretching.  Discussed strict return precautions. Patient verbalized understanding and is agreeable with plan.  Final Clinical Impressions(s) / UC Diagnoses   Final diagnoses:  Strain of neck muscle, initial encounter  Acute left-sided thoracic back pain     Discharge Instructions     We gave an injection of Toradol today Continue with Mobic daily with food-please use instead of ibuprofen/Naprosyn May supplement tizanidine at home/bedtime- -Use as alternative to baclofen Alternate ice and heat Gentle stretching of neck Follow-up if not improving or worsening    ED Prescriptions    Medication Sig Dispense Auth. Provider   tiZANidine  (ZANAFLEX) 4 MG tablet Take 1 tablet (4 mg total) by mouth every 6 (six) hours as needed for muscle spasms. 30 tablet Saraann Enneking C, PA-C   meloxicam (MOBIC) 7.5 MG tablet Take 1 tablet (7.5 mg total) by mouth daily. Take in the morning, with food. 10 tablet Jayland Null, [place] C, PA-C     I have reviewed the PDMP during this encounter.   Janith Lima, [place] 09/28/20 1514

## 2020-09-29 ENCOUNTER — Telehealth: Payer: Self-pay | Admitting: Hematology and Oncology

## 2020-09-29 ENCOUNTER — Inpatient Hospital Stay: Payer: 59 | Attending: Hematology and Oncology | Admitting: Hematology and Oncology

## 2020-09-29 DIAGNOSIS — D0512 Intraductal carcinoma in situ of left breast: Secondary | ICD-10-CM | POA: Insufficient documentation

## 2020-09-29 NOTE — Assessment & Plan Note (Deleted)
Screening mammogram on 07/20/20 showed left breast calcifications. Diagnostic mammogram on 08/27/20 showed indeterminate left breast calcifications, 1.2cm, upper outer quadrant. Biopsy on 09/09/20 showed ductal carcinoma in situ, ER+ >95%, PR+ 55  Pathology review: I discussed with the patient the difference between DCIS and invasive breast cancer. It is considered a precancerous lesion. DCIS is classified as a 0. It is generally detected through mammograms as calcifications. We discussed the significance of grades and its impact on prognosis. We also discussed the importance of ER and PR receptors and their implications to adjuvant treatment options. Prognosis of DCIS dependence on grade, comedo necrosis. It is anticipated that if not treated, 20-30% of DCIS can develop into invasive breast cancer.  Recommendation: 1. Breast conserving surgery 2. Followed by adjuvant radiation therapy 3. Followed by antiestrogen therapy with tamoxifen 5 years  Tamoxifen counseling: We discussed the risks and benefits of tamoxifen. These include but not limited to insomnia, hot flashes, mood changes, vaginal dryness, and weight gain. Although rare, serious side effects including endometrial cancer, risk of blood clots were also discussed. We strongly believe that the benefits far outweigh the risks. Patient understands these risks and consented to starting treatment. Planned treatment duration is 5 years.  Return to clinic after surgery to discuss the final pathology report and come up with an adjuvant treatment plan.

## 2020-09-29 NOTE — Telephone Encounter (Signed)
Candice Maldonado cld stating she missed her appt w/Dr. Lindi Adie today. She has been rescheduled to see Dr. Lindi Adie on 1/4 at 4pm. Letter mailed.

## 2020-10-05 ENCOUNTER — Ambulatory Visit (HOSPITAL_COMMUNITY)
Admission: RE | Admit: 2020-10-05 | Discharge: 2020-10-05 | Disposition: A | Discharge status: Home / Self Care | Payer: 59 | Source: Ambulatory Visit | Attending: Surgery | Admitting: Surgery

## 2020-10-05 DIAGNOSIS — D0512 Intraductal carcinoma in situ of left breast: Secondary | ICD-10-CM | POA: Insufficient documentation

## 2020-10-05 IMAGING — MR MR BREAST BILAT WO/W CM
8 of 12 series · 28 of 48 positions shown · IV contrast (GADAVIST)
Comparison: Diagnostic mammogram [DATE]

CLINICAL DATA: Patient with recently diagnosed left breast DCIS.
History of left breast cancer in [QZ] status post lumpectomy and
radiation.

EXAM:
BILATERAL BREAST MRI WITH AND WITHOUT CONTRAST
TECHNIQUE: Multiplanar, multisequence MR images of both breasts were obtained
prior to and following the intravenous administration of 9.2 ml of
Gadavist

[Series 2: T2 · axial · 3.0mm · 0.83mm/px · 1 of 53 slices shown]
[im 1/53]
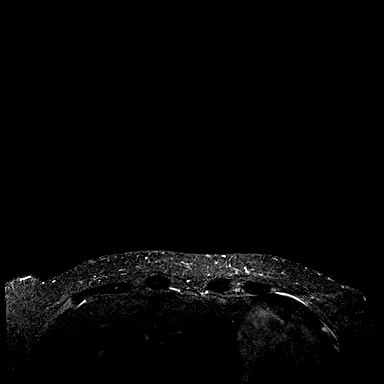

[Series 3: T1 fat-sat · axial · 1.6mm · 0.87mm/px · z∈[-66,+112]mm · 4 of 112 slices shown (1 of 4)]
[im 1/112]
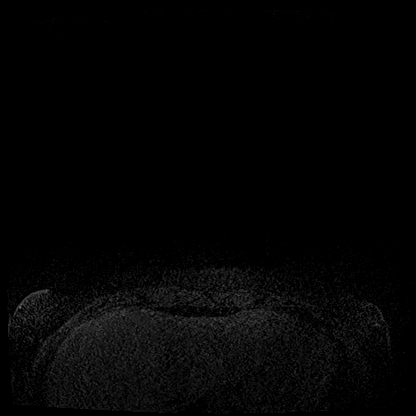
[im 38/112]
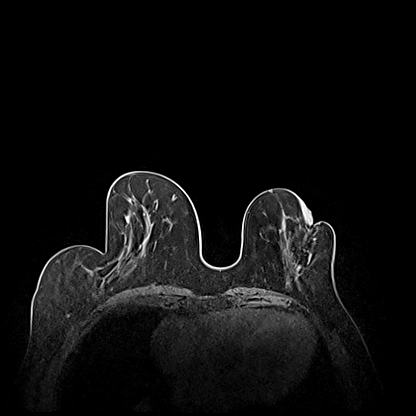
[im 75/112]
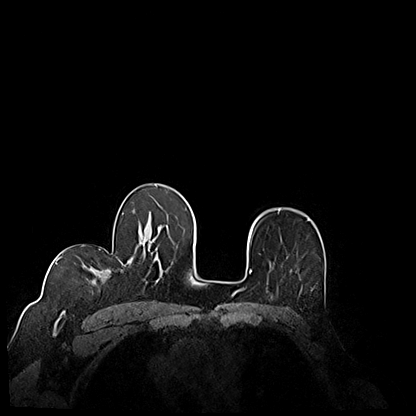
[im 112/112]
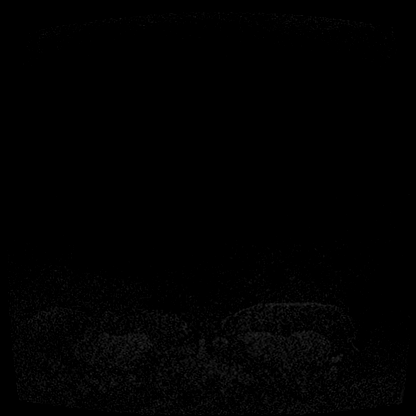

[Series 4: T1 fat-sat · axial · 1.2mm · 0.71mm/px · z∈[-63,+109]mm · 6 of 143 slices shown (2 of 4)]
[im 1/143]
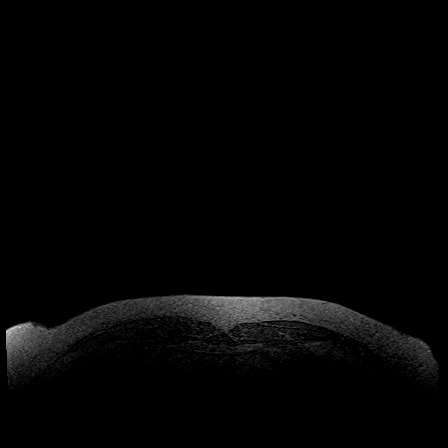
[im 29/143]
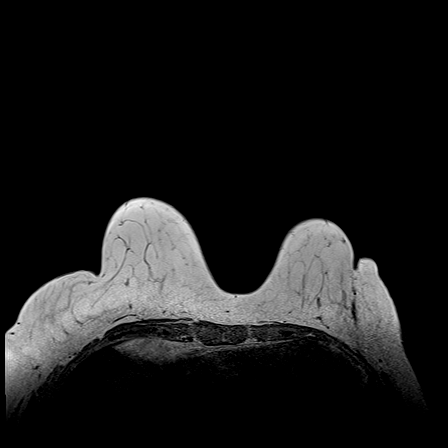
[im 57/143]
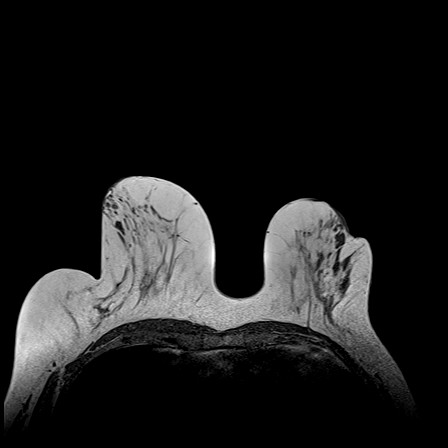
[im 86/143]
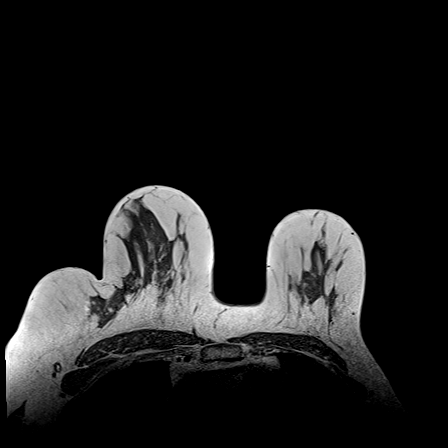
[im 114/143]
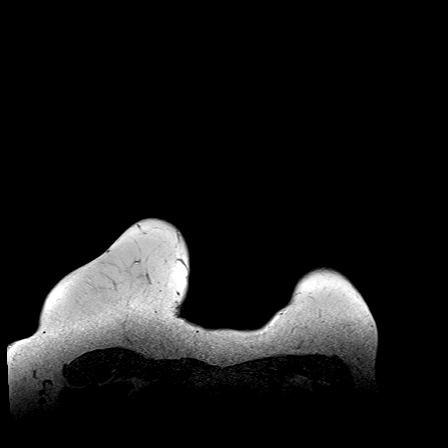
[im 143/143]
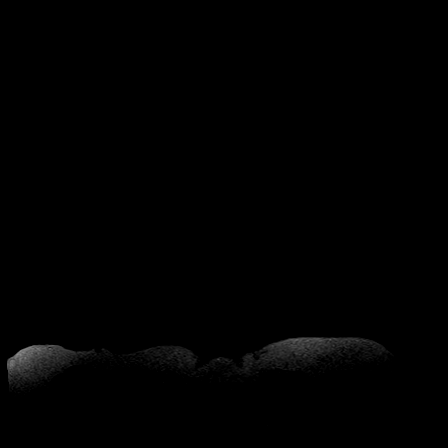

[Series 5: T1 fat-sat · axial · 1.6mm · 0.87mm/px · z∈[-66,+112]mm · 5 of 112 slices shown (3 of 4)]
[im 1/112]
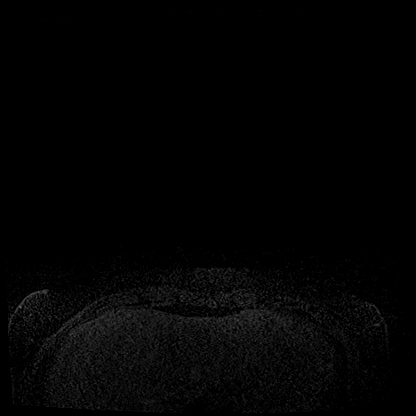
[im 28/112]
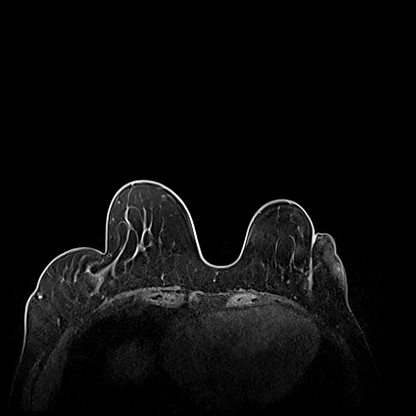
[im 56/112]
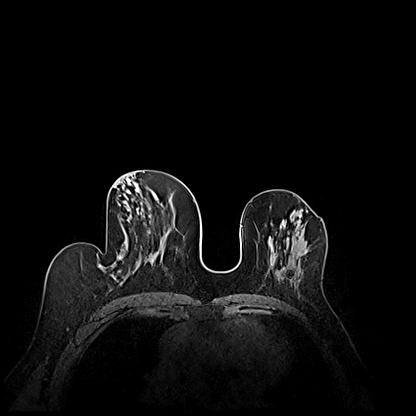
[im 84/112]
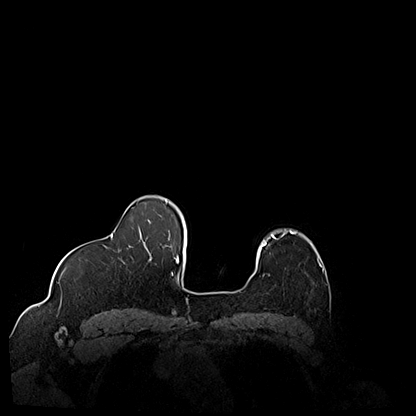
[im 112/112]
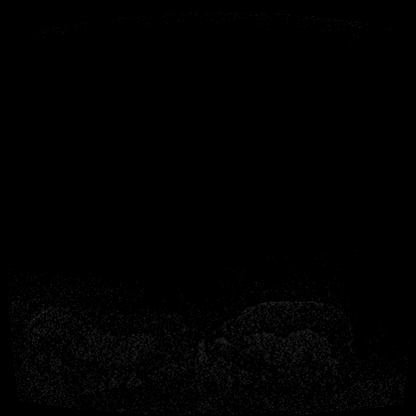

[Series 6: T1 fat-sat · axial · 1.6mm · 0.87mm/px · z∈[-66,+112]mm · 5 of 112 slices shown (4 of 4)]
[im 1/112]
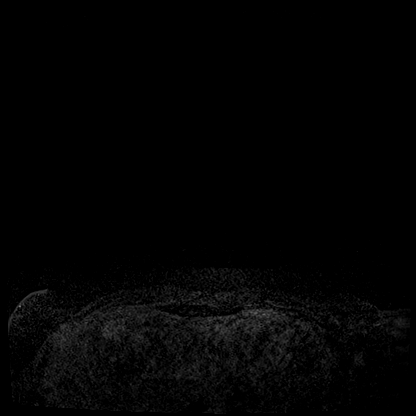
[im 28/112]
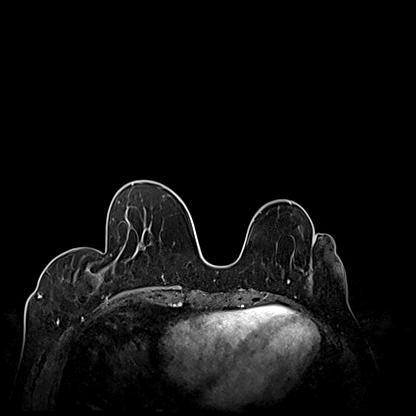
[im 56/112]
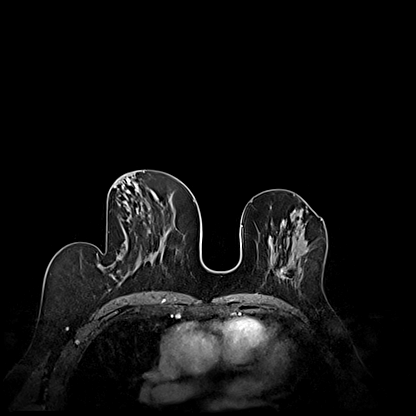
[im 84/112]
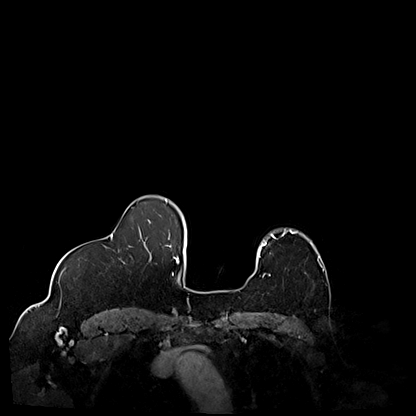
[im 112/112]
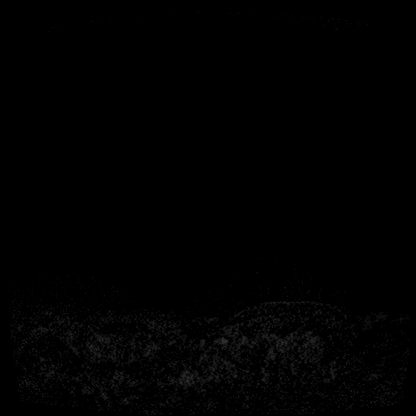

[Series 7: T1 · axial · 1.6mm · 0.87mm/px · z∈[-66,+112]mm · 5 of 112 slices shown (1 of 3)]
[im 1/112]
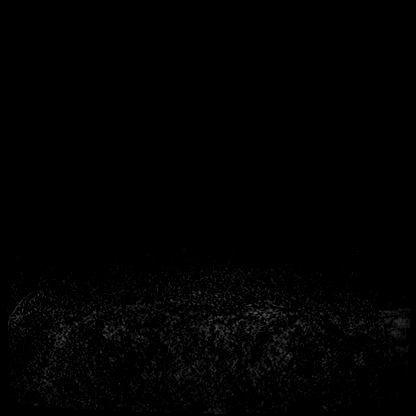
[im 28/112]
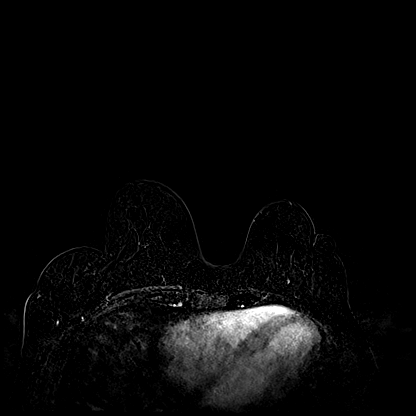
[im 56/112]
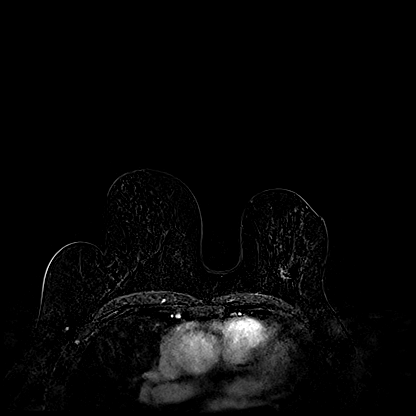
[im 84/112]
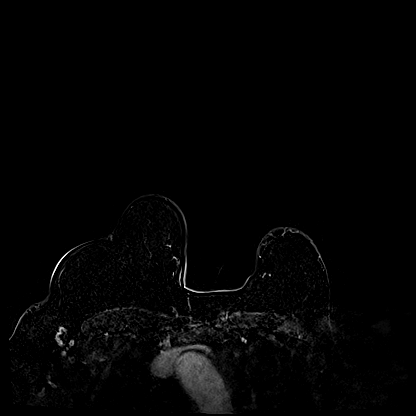
[im 112/112]
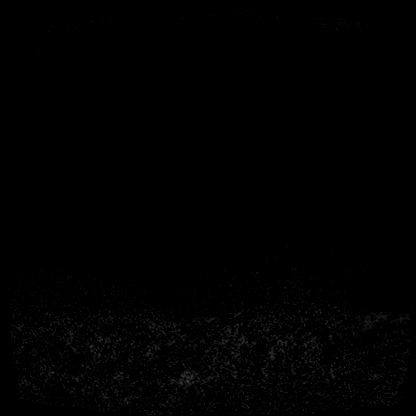

[Series 8: T1 · coronal · 360.0mm · 0.87mm/px · 1 of 3 slices shown (2 of 3)]
[im 1/3]
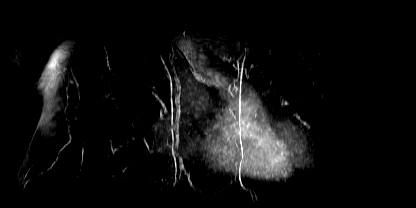

[Series 9: T1 · axial · 179.2mm · 0.87mm/px · 1 of 3 slices shown (3 of 3)]
[im 1/3]
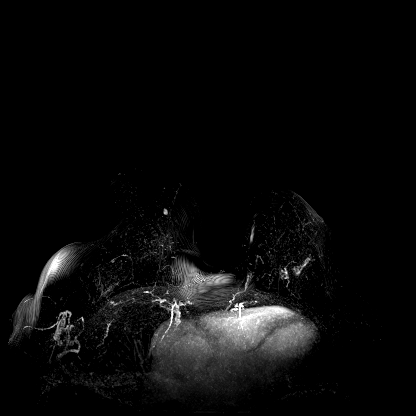

[28 of 48 positions shown; findings below may reference images not displayed]

Three-dimensional MR images were rendered by post-processing of the
original MR data on an independent workstation. The
three-dimensional MR images were interpreted, and findings are
reported in the following complete MRI report for this study. Three
dimensional images were evaluated at the independent interpreting
workstation using the DynaCAD thin client.
FINDINGS: Breast composition: b. Scattered fibroglandular tissue.

Background parenchymal enhancement: Minimal

Right breast: Within the upper inner right breast middle depth there
is a 7 mm oval enhancing mass with mild T2 signal (image 60; series
11). No additional concerning areas of enhancement identified within
the right breast.

Left breast: Postlumpectomy changes identified within the inferior
left breast.

Superior to the lumpectomy site there is a 1.4 x 1.5 cm irregular
area of non mass enhancement containing susceptibility artifact
compatible with recently biopsied DCIS (image 56; series 11).
Extending inferior and lateral to this focal area of non mass
enhancement is additional suspicious linear non mass enhancement
measuring approximately 3.1 cm (image [id number]; series 11).

Lymph nodes: No abnormal appearing lymph nodes.

Ancillary findings: Probable 1.5 cm cyst within the medial left
hepatic lobe, incompletely visualized.
IMPRESSION: 1. Biopsy-proven malignancy with left breast. Extending inferior and
lateral to the site of biopsy there is approximately 3.1 cm of
linear non mass enhancement concerning for associated extent of
disease.
2. Indeterminate enhancing mass within the right breast.

RECOMMENDATION:
1. MRI guided core needle biopsy of the anterolateral extent of the
non mass enhancement extending from the site of biopsy within the
left breast for extent of disease.
2. Second-look ultrasound in an attempt to identify the right breast
7 mm mass.
3. Recommend the second-look ultrasound be performed first. That way
if the mass in the right breast is not able to be identified, it can
be biopsied at the time of MRI guided core needle biopsy of left
breast.

BI-RADS CATEGORY  4: Suspicious.

## 2020-10-05 MED ORDER — GADOBUTROL 1 MMOL/ML IV SOLN
9.2000 mL | Freq: Once | INTRAVENOUS | Status: AC | PRN
  Administered 2020-10-05: 9.2 mL via INTRAVENOUS

## 2020-10-06 ENCOUNTER — Inpatient Hospital Stay (HOSPITAL_BASED_OUTPATIENT_CLINIC_OR_DEPARTMENT_OTHER): Payer: 59 | Admitting: Genetic Counselor

## 2020-10-06 ENCOUNTER — Other Ambulatory Visit: Payer: Self-pay | Admitting: Genetic Counselor

## 2020-10-06 ENCOUNTER — Other Ambulatory Visit: Payer: Self-pay

## 2020-10-06 ENCOUNTER — Encounter: Payer: Self-pay | Admitting: Genetic Counselor

## 2020-10-06 ENCOUNTER — Inpatient Hospital Stay: Payer: 59

## 2020-10-06 DIAGNOSIS — D0512 Intraductal carcinoma in situ of left breast: Secondary | ICD-10-CM

## 2020-10-06 DIAGNOSIS — Z853 Personal history of malignant neoplasm of breast: Secondary | ICD-10-CM

## 2020-10-06 DIAGNOSIS — Z803 Family history of malignant neoplasm of breast: Secondary | ICD-10-CM

## 2020-10-06 DIAGNOSIS — D0510 Intraductal carcinoma in situ of unspecified breast: Secondary | ICD-10-CM | POA: Insufficient documentation

## 2020-10-06 LAB — GENETIC SCREENING ORDER

## 2020-10-06 NOTE — Progress Notes (Signed)
REFERRING PROVIDER: Donnie Mesa, MD [address] [place],  [place] [address]  PRIMARY PROVIDER:  Sonia Side., FNP  PRIMARY REASON FOR VISIT:  1. Ductal carcinoma in situ (DCIS) of left breast   2. Family history of breast cancer   3. Personal history of malignant neoplasm of breast      HISTORY OF PRESENT ILLNESS:   Candice Maldonado, a 64 y.o. female, was seen for a [place] cancer genetics consultation at the request of Dr. Georgette Dover due to a personal and family history of breast cancer.  Candice Maldonado presents to clinic today to discuss the possibility of a hereditary predisposition to cancer, genetic testing, and to further clarify her future cancer risks, as well as potential cancer risks for family members.   In 2000, at the age of 11, Candice Maldonado was diagnosed with cancer of the left breast. The treatment plan included lumpectomy, chemotherapy and radiation.  She reports not having genetic testing at that time.     CANCER HISTORY:  Oncology History  Ductal carcinoma in situ (DCIS) of left breast  09/09/2020 Initial Diagnosis    Screening mammogram on 07/20/20 showed left breast calcifications. Diagnostic mammogram on 08/27/20 showed indeterminate left breast calcifications, 1.2cm, upper outer quadrant. Biopsy on 09/09/20 showed ductal carcinoma in situ, ER+ >95%, PR+ 55   09/29/2020 Cancer Staging   Staging form: Breast, AJCC 8th Edition - Clinical: Stage 0 (cTis (DCIS), cN0, cM0, G2, ER+, PR+, HER2: Not Assessed) - Signed by Nicholas Lose, MD on 09/29/2020      RISK FACTORS:  Menarche was at age 10.  First live birth at age 61.  OCP use for approximately <5 years.  Ovaries intact: yes.  Hysterectomy: no.  Menopausal status: postmenopausal.  HRT use: 0 years. Colonoscopy: yes; normal. Mammogram within the last year: yes. Number of breast biopsies: 1. Up to date with pelvic exams: yes. Any excessive radiation exposure in the past: no  Past Medical History:   Diagnosis Date  . Arthritis   . Breast cancer ([place]) 2000  . Colon polyps 2015  . Diabetes ([place])   . Ductal carcinoma in situ of breast   . Family history of breast cancer   . Gallstones   . Obesity   . Personal history of chemotherapy   . Personal history of malignant neoplasm of breast   . Personal history of radiation therapy     Past Surgical History:  Procedure Laterality Date  . BACK SURGERY  2011  . BREAST LUMPECTOMY    . CHOLECYSTECTOMY  1980  . TUBAL LIGATION  1990    Social History   Socioeconomic History  . Marital status: Widowed    Spouse name: Not on file  . Number of children: Not on file  . Years of education: Not on file  . Highest education level: Not on file  Occupational History  . Not on file  Tobacco Use  . Smoking status: Never Smoker  . Smokeless tobacco: Current User  Substance and Sexual Activity  . Alcohol use: Never  . Drug use: Never  . Sexual activity: Not on file  Other Topics Concern  . Not on file  Social History Narrative  . Not on file   Social Determinants of Health   Financial Resource Strain: Not on file  Food Insecurity: Not on file  Transportation Needs: Not on file  Physical Activity: Not on file  Stress: Not on file  Social Connections: Not on file  FAMILY HISTORY:  We obtained a detailed, 4-generation family history.  Significant diagnoses are listed below: Family History  Problem Relation Age of Onset  . Breast cancer Mother        dx >50  . Diabetes Mother   . Diabetes Sister   . Breast cancer Sister 14  . Diabetes Brother   . Breast cancer Maternal Grandmother        dx >60  . Irritable bowel syndrome Daughter   . Breast cancer Daughter 5  . Stroke Father   . Diabetes Father   . Breast cancer Other        MGMs sister    The patient has three daughters and one son.  One daughter had breast cancer at 66 and reportedly had possible VUS.  Another daughter reportedly had genetic testing that was  negative.  She has four sisters and six brothers.  One brother had throat cancer and one sister had breast cancer at 21.  Both parents are deceased.  The patient's mother had breast cancer over the age of 3.  She was an only child.  Her mother had breast cancer and her father died of a stroke.  Her mother's sister also had breast cancer under age 38.  The patient's father died of a stroke at 50.  He had several siblings, none reported to have cancer.  Candice Maldonado is aware of previous family history of genetic testing for hereditary cancer risks. Patient's maternal ancestors are of [place] American and Caucasian descent, and paternal ancestors are of African American descent. There is no reported Ashkenazi Jewish ancestry. There is no known consanguinity.  GENETIC COUNSELING ASSESSMENT: Candice Maldonado is a 64 y.o. female with a personal and family history of breast cancer which is somewhat suggestive of a hereditary cancer syndrome and predisposition to cancer given the number of women in the family with breast cancer, some with early ages. We, therefore, discussed and recommended the following at today's visit.   DISCUSSION: We discussed that 5 - 10% of breast cancer is hereditary, with most cases associated with BRCA mutations.  There are other genes that can be associated with hereditary breast cancer syndromes.  These include ATM, CHEK2 and PALB2.  We discussed that testing is beneficial for several reasons including knowing how to follow individuals after completing their treatment, identifying whether potential treatment options such as PARP inhibitors would be beneficial, and understand if other family members could be at risk for cancer and allow them to undergo genetic testing.   We reviewed the characteristics, features and inheritance patterns of hereditary cancer syndromes. We also discussed genetic testing, including the appropriate family members to test, the process of testing, insurance  coverage and turn-around-time for results. We discussed the implications of a negative, positive, carrier and/or variant of uncertain significant result. We recommended Candice Maldonado pursue genetic testing for the CancerNext-Expanded+RNAinsight gene panel. The CancerNext-Expanded gene panel offered by [facility] and includes sequencing and rearrangement analysis for the following 77 genes: AIP, ALK, APC*, ATM*, AXIN2, BAP1, BARD1, BLM, BMPR1A, BRCA1*, BRCA2*, BRIP1*, CDC73, CDH1*, CDK4, CDKN1B, CDKN2A, CHEK2*, CTNNA1, DICER1, FANCC, FH, FLCN, GALNT12, KIF1B, LZTR1, MAX, MEN1, MET, MLH1*, MSH2*, MSH3, MSH6*, MUTYH*, NBN, NF1*, NF2, NTHL1, PALB2*, PHOX2B, PMS2*, POT1, PRKAR1A, PTCH1, PTEN*, RAD51C*, RAD51D*, RB1, RECQL, RET, SDHA, SDHAF2, SDHB, SDHC, SDHD, SMAD4, SMARCA4, SMARCB1, SMARCE1, STK11, SUFU, TMEM127, TP53*, TSC1, TSC2, VHL and XRCC2 (sequencing and deletion/duplication); EGFR, EGLN1, HOXB13, KIT, MITF, PDGFRA, POLD1, and POLE (sequencing only); EPCAM and GREM1 (  deletion/duplication only). DNA and RNA analyses performed for * genes.   Based on Candice Maldonado personal and family history of cancer, she meets medical criteria for genetic testing. Despite that she meets criteria, she may still have an out of pocket cost. We discussed that if her out of pocket cost for testing is over $100, the laboratory will call and confirm whether she wants to proceed with testing.  If the out of pocket cost of testing is less than $100 she will be billed by the genetic testing laboratory.   PLAN: After considering the risks, benefits, and limitations, Candice Maldonado provided informed consent to pursue genetic testing and the blood sample was sent to [facility] for analysis of the BRCAPlus and CancerNext-Expanded+RNAinsight. Results should be available within approximately 2-3 weeks' time, at which point they will be disclosed by telephone to Candice Maldonado, as will any additional recommendations warranted by  these results. Candice Maldonado will receive a summary of her genetic counseling visit and a copy of her results once available. This information will also be available in Epic.   Lastly, we encouraged Candice Maldonado to remain in contact with cancer genetics annually so that we can continuously update the family history and inform her of any changes in cancer genetics and testing that may be of benefit for this family.   Candice Maldonado questions were answered to her satisfaction today. Our contact information was provided should additional questions or concerns arise. Thank you for the referral and allowing [place] to share in the care of your patient.   Smaran Gaus P. Florene Glen, [place], [facility] Licensed, Insurance risk surveyor [name] phone: [phone]  The patient was seen for a total of 30 minutes in face-to-face genetic counseling.  This patient was discussed with Drs. Magrinat, Lindi Adie and/or Burr Medico who agrees with the above.    _______________________________________________________________________ For Office Staff:  Number of people involved in session: 2 Was an Intern/ student involved with case: no

## 2020-10-07 ENCOUNTER — Ambulatory Visit
Admission: RE | Admit: 2020-10-07 | Discharge: 2020-10-07 | Disposition: A | Discharge status: Home / Self Care | Payer: 59 | Source: Ambulatory Visit | Attending: Student | Admitting: Student

## 2020-10-07 DIAGNOSIS — Z780 Asymptomatic menopausal state: Secondary | ICD-10-CM

## 2020-10-11 NOTE — Progress Notes (Addendum)
[facility] CONSULT NOTE  Patient Care Team: Sonia Side., FNP as PCP - General (Family Medicine)  CHIEF COMPLAINTS/PURPOSE OF CONSULTATION:  Newly diagnosed breast cancer  HISTORY OF PRESENTING ILLNESS:  Candice Maldonado 65 y.o. female is here because of recent diagnosis of DCIS of the left breast. Screening mammogram on 07/30/20 showed left breast calcifications. Diagnostic mammogram on 08/27/20 showed indeterminate calcifications spanning 1.2cm in the upper outer left breast. Biopsy on 09/09/20 showed ductal carcinoma in situ, ER+ >95%, PR+ 5%. Breast MRI on 10/05/20 showed the known left breast malignancy with 3.1cm of non-mass enhancement, and in the right breast, and indeterminate mass. She has a history of left breast cancer in 2000 for which she underewnt a lumpectomy, chemotherapy and radiation in [place]. She presents to the clinic today for initial evaluation and discussion of treatment options.   I reviewed her records extensively and collaborated the history with the patient.  SUMMARY OF ONCOLOGIC HISTORY: Oncology History  Ductal carcinoma in situ (DCIS) of left breast  2000 Miscellaneous   history of left breast cancer in 2000 for which she underewnt a lumpectomy, chemotherapy and radiation   09/09/2020 Initial Diagnosis   Screening mammogram on 07/20/20 showed left breast calcifications. Diagnostic mammogram on 08/27/20 showed indeterminate left breast calcifications, 1.2cm, upper outer quadrant. Biopsy on 09/09/20 showed ductal carcinoma in situ, ER+ >95%, PR+ 5   09/29/2020 Cancer Staging   Staging form: Breast, AJCC 8th Edition - Clinical: Stage 0 (cTis (DCIS), cN0, cM0, G2, ER+, PR+, HER2: Not Assessed) - Signed by Nicholas Lose, MD on 09/29/2020      MEDICAL HISTORY:  Past Medical History:  Diagnosis Date  . Arthritis   . Breast cancer ([place]) 2000  . Colon polyps 2015  . Diabetes ([place])   . Ductal carcinoma in situ of breast   . Family  history of breast cancer   . Gallstones   . Obesity   . Personal history of chemotherapy   . Personal history of malignant neoplasm of breast   . Personal history of radiation therapy     SURGICAL HISTORY: Past Surgical History:  Procedure Laterality Date  . BACK SURGERY  2011  . BREAST LUMPECTOMY    . CHOLECYSTECTOMY  1980  . TUBAL LIGATION  1990    SOCIAL HISTORY: Social History   Socioeconomic History  . Marital status: Widowed    Spouse name: Not on file  . Number of children: Not on file  . Years of education: Not on file  . Highest education level: Not on file  Occupational History  . Not on file  Tobacco Use  . Smoking status: Never Smoker  . Smokeless tobacco: Current User  Substance and Sexual Activity  . Alcohol use: Never  . Drug use: Never  . Sexual activity: Not on file  Other Topics Concern  . Not on file  Social History Narrative  . Not on file   Social Determinants of Health   Financial Resource Strain: Not on file  Food Insecurity: Not on file  Transportation Needs: Not on file  Physical Activity: Not on file  Stress: Not on file  Social Connections: Not on file  Intimate Partner Violence: Not on file    FAMILY HISTORY: Family History  Problem Relation Age of Onset  . Breast cancer Mother        dx >50  . Diabetes Mother   . Diabetes Sister   . Breast cancer Sister 12  .  Diabetes Brother   . Breast cancer Maternal Grandmother        dx >60  . Irritable bowel syndrome Daughter   . Breast cancer Daughter 86  . Stroke Father   . Diabetes Father   . Breast cancer Other        MGMs sister    ALLERGIES:  is allergic to tape.  MEDICATIONS:  Current Outpatient Medications  Medication Sig Dispense Refill  . ascorbic acid (VITAMIN C) 500 MG tablet Take 500 mg by mouth daily.    Marland Kitchen atorvastatin (LIPITOR) 10 MG tablet Take 10 mg by mouth daily.    . baclofen (LIORESAL) 10 MG tablet Take 10 mg by mouth as needed for muscle spasms.    .  Black Cohosh 40 MG CAPS Take 1 capsule by mouth daily.    . hydrochlorothiazide (HYDRODIURIL) 25 MG tablet Take 25 mg by mouth daily.    Marland Kitchen HYDROcodone-acetaminophen (NORCO) 7.5-325 MG tablet Take 1 tablet by mouth every 6 (six) hours as needed for moderate pain.    . meloxicam (MOBIC) 7.5 MG tablet Take 1 tablet (7.5 mg total) by mouth daily. Take in the morning, with food. 10 tablet 0  . Multiple Vitamin (MULTIVITAMIN) tablet Take 1 tablet by mouth daily.    . naproxen (NAPROSYN) 500 MG tablet Take 500 mg by mouth 2 (two) times daily with a meal.    . omeprazole (PRILOSEC) 20 MG capsule Take 20 mg by mouth daily as needed.    . pregabalin (LYRICA) 200 MG capsule Take 200 mg by mouth 3 (three) times daily.    Marland Kitchen pyridOXINE (VITAMIN B-6) 100 MG tablet Take 100 mg by mouth daily.    Marland Kitchen tiZANidine (ZANAFLEX) 4 MG tablet Take 1 tablet (4 mg total) by mouth every 6 (six) hours as needed for muscle spasms. 30 tablet 0  . vitamin B-12 (CYANOCOBALAMIN) 1000 MCG tablet Take 1,000 mcg by mouth daily.     No current facility-administered medications for this visit.    REVIEW OF SYSTEMS:     All other systems were reviewed with the patient and are negative.  PHYSICAL EXAMINATION: ECOG PERFORMANCE STATUS: 1 - Symptomatic but completely ambulatory  Vitals:   10/12/20 1629  BP: (!) 153/92  Pulse: 89  Resp: 18  Temp: 98.4 F (36.9 C)  SpO2: 100%   Filed Weights   10/12/20 1629  Weight: 226 lb (102.5 kg)       LABORATORY DATA:  I have reviewed the data as listed No results found for: WBC, HGB, HCT, MCV, PLT No results found for: NA, K, CL, CO2  RADIOGRAPHIC STUDIES: I have personally reviewed the radiological reports and agreed with the findings in the report.  ASSESSMENT AND PLAN:  Ductal carcinoma in situ (DCIS) of left breast Screening mammogram on 07/20/20 showed left breast calcifications. Diagnostic mammogram on 08/27/20 showed indeterminate left breast calcifications, 1.2cm, upper  outer quadrant. Biopsy on 09/09/20 showed ductal carcinoma in situ, ER+ >95%, PR+ 5  (History of breast cancer in the left breast in 2000: [place]: Treated with lumpectomy, chemotherapy and adjuvant radiation.  She does not know what type it is but she did not receive antiestrogen therapy.  Therefore it is possible that she had triple negative disease originally)  Pathology review: I discussed with the patient the difference between DCIS and invasive breast cancer. It is considered a precancerous lesion. DCIS is classified as a 0. It is generally detected through mammograms as calcifications. We discussed the  significance of grades and its impact on prognosis. We also discussed the importance of ER and PR receptors and their implications to adjuvant treatment options. Prognosis of DCIS dependence on grade, comedo necrosis. It is anticipated that if not treated, 20-30% of DCIS can develop into invasive breast cancer.  Recommendation: 1.  Genetic testing has been done 2. biopsy of the right breast lesion is pending Patient wishes to undergo bilateral mastectomies. If she does bilateral mastectomies and there is no role of adjuvant antiestrogen therapy.  Return to clinic after surgery to discuss the final pathology report and come up with an adjuvant treatment plan.   All questions were answered. The patient knows to call the clinic with any problems, questions or concerns.   Rulon Eisenmenger, MD, MPH 10/12/2020    I, Molly Dorshimer, am acting as scribe for Nicholas Lose, MD.  I have reviewed the above documentation for accuracy and completeness, and I agree with the above.

## 2020-10-12 ENCOUNTER — Inpatient Hospital Stay: Payer: 59 | Attending: Hematology and Oncology | Admitting: Hematology and Oncology

## 2020-10-12 ENCOUNTER — Encounter: Payer: Self-pay | Admitting: *Deleted

## 2020-10-12 ENCOUNTER — Other Ambulatory Visit: Payer: Self-pay

## 2020-10-12 ENCOUNTER — Other Ambulatory Visit: Payer: Self-pay | Admitting: Surgery

## 2020-10-12 DIAGNOSIS — Z86010 Personal history of colonic polyps: Secondary | ICD-10-CM | POA: Insufficient documentation

## 2020-10-12 DIAGNOSIS — N6312 Unspecified lump in the right breast, upper inner quadrant: Secondary | ICD-10-CM

## 2020-10-12 DIAGNOSIS — Z853 Personal history of malignant neoplasm of breast: Secondary | ICD-10-CM | POA: Diagnosis not present

## 2020-10-12 DIAGNOSIS — Z803 Family history of malignant neoplasm of breast: Secondary | ICD-10-CM | POA: Insufficient documentation

## 2020-10-12 DIAGNOSIS — D0512 Intraductal carcinoma in situ of left breast: Secondary | ICD-10-CM | POA: Diagnosis present

## 2020-10-12 NOTE — Assessment & Plan Note (Signed)
Screening mammogram on 07/20/20 showed left breast calcifications. Diagnostic mammogram on 08/27/20 showed indeterminate left breast calcifications, 1.2cm, upper outer quadrant. Biopsy on 09/09/20 showed ductal carcinoma in situ, ER+ >95%, PR+ 5  Pathology review: I discussed with the patient the difference between DCIS and invasive breast cancer. It is considered a precancerous lesion. DCIS is classified as a 0. It is generally detected through mammograms as calcifications. We discussed the significance of grades and its impact on prognosis. We also discussed the importance of ER and PR receptors and their implications to adjuvant treatment options. Prognosis of DCIS dependence on grade, comedo necrosis. It is anticipated that if not treated, 20-30% of DCIS can develop into invasive breast cancer.  Recommendation: 1. Breast conserving surgery 2. Followed by adjuvant radiation therapy 3. Followed by antiestrogen therapy with tamoxifen 5 years  Tamoxifen counseling: We discussed the risks and benefits of tamoxifen. These include but not limited to insomnia, hot flashes, mood changes, vaginal dryness, and weight gain. Although rare, serious side effects including endometrial cancer, risk of blood clots were also discussed. We strongly believe that the benefits far outweigh the risks. Patient understands these risks and consented to starting treatment. Planned treatment duration is 5 years.  Return to clinic after surgery to discuss the final pathology report and come up with an adjuvant treatment plan.

## 2020-10-13 ENCOUNTER — Other Ambulatory Visit: Payer: Self-pay | Admitting: Surgery

## 2020-10-13 DIAGNOSIS — N6312 Unspecified lump in the right breast, upper inner quadrant: Secondary | ICD-10-CM

## 2020-10-14 ENCOUNTER — Telehealth: Payer: Self-pay | Admitting: Genetic Counselor

## 2020-10-14 ENCOUNTER — Encounter: Payer: Self-pay | Admitting: Genetic Counselor

## 2020-10-14 ENCOUNTER — Telehealth: Payer: Self-pay | Admitting: Hematology and Oncology

## 2020-10-14 ENCOUNTER — Encounter: Payer: Self-pay | Admitting: *Deleted

## 2020-10-14 DIAGNOSIS — Z1379 Encounter for other screening for genetic and chromosomal anomalies: Secondary | ICD-10-CM | POA: Insufficient documentation

## 2020-10-14 NOTE — Telephone Encounter (Signed)
Negative genetic testing on the BRCAplus panel testing.  We will call when the remainder of testing is back.

## 2020-10-14 NOTE — Telephone Encounter (Signed)
No 1/4 los, no changes made to pt schedule  

## 2020-10-20 ENCOUNTER — Telehealth: Payer: Self-pay | Admitting: Genetic Counselor

## 2020-10-20 NOTE — Telephone Encounter (Signed)
LM on VM that results are back and to please call. 

## 2020-10-21 ENCOUNTER — Ambulatory Visit
Admission: RE | Admit: 2020-10-21 | Discharge: 2020-10-21 | Disposition: A | Discharge status: Home / Self Care | Payer: 59 | Source: Ambulatory Visit | Attending: Surgery | Admitting: Surgery

## 2020-10-21 ENCOUNTER — Encounter: Payer: Self-pay | Admitting: *Deleted

## 2020-10-21 ENCOUNTER — Other Ambulatory Visit: Payer: Self-pay

## 2020-10-21 DIAGNOSIS — N6312 Unspecified lump in the right breast, upper inner quadrant: Secondary | ICD-10-CM

## 2020-10-21 IMAGING — US US BREAST*R* LIMITED INC AXILLA
1 series · 2 of 2 positions shown · non-contrast
Comparison: Previous exam(s).

CLINICAL DATA: 64-year-old female with newly diagnosed left breast
DCIS. Patient presents for second-look ultrasound of the right
breast following MRI demonstrating a 7 mm mass in the right upper
quadrant.

EXAM:
ULTRASOUND OF THE RIGHT BREAST

[Series 1: us breast*right* limited inc axilla · 0.07mm/px · 2 of 2 slices shown]
[im 1/2]
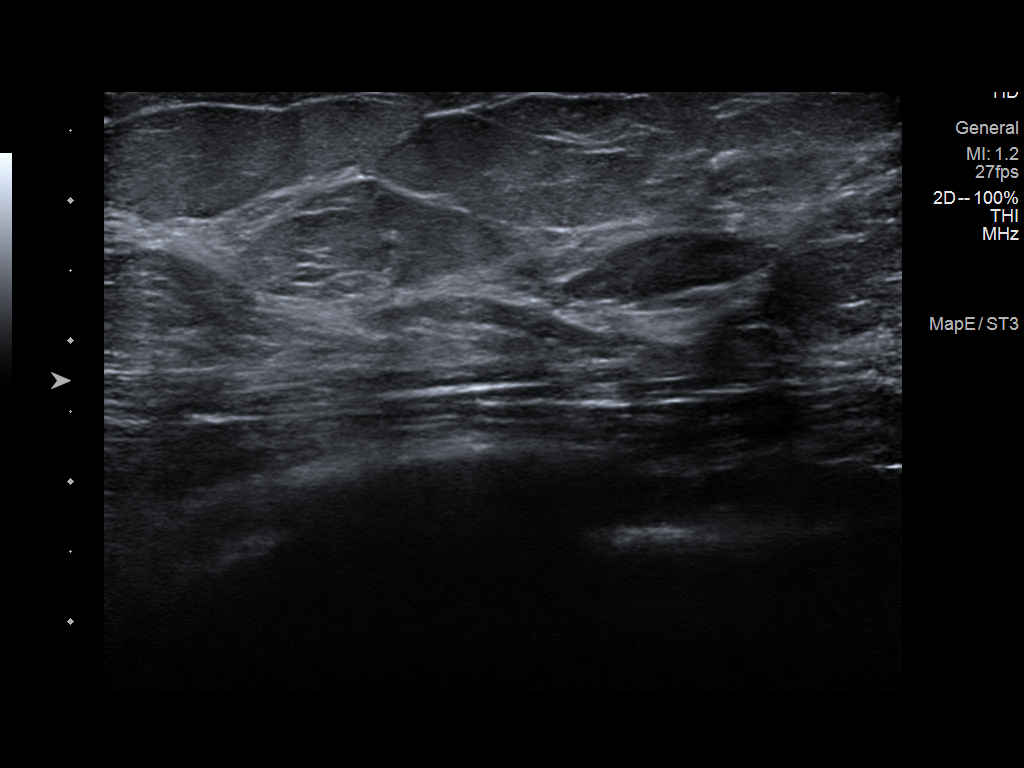
[im 2/2]
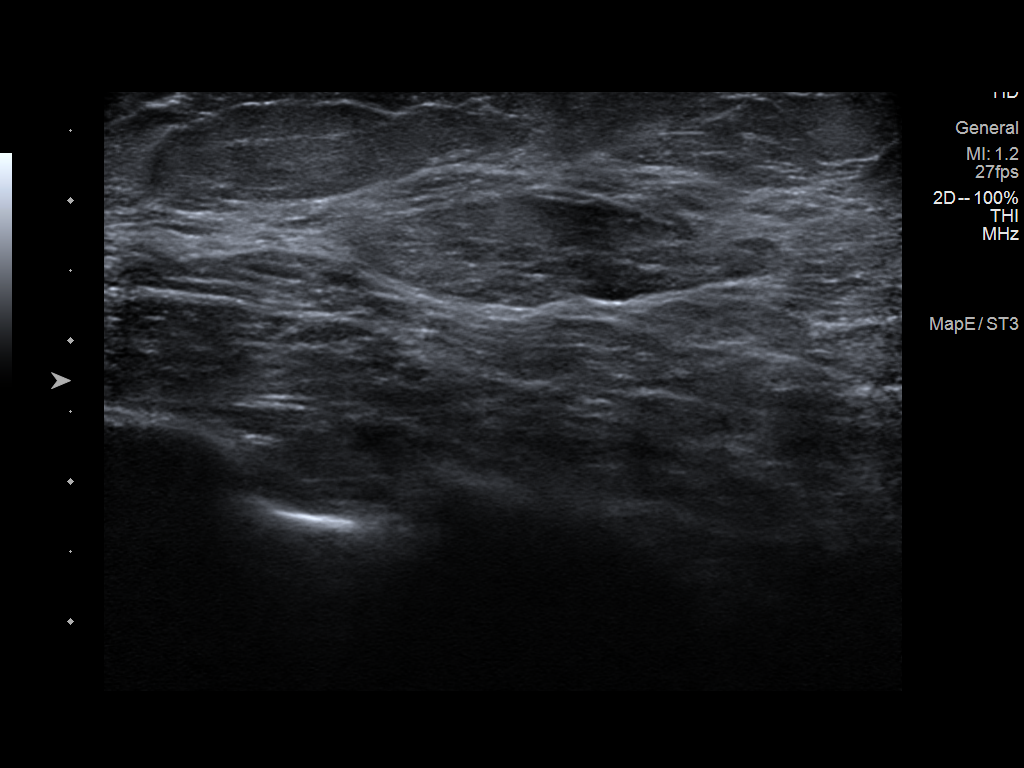

[2 of 2 positions shown; findings below may reference images not displayed]

FINDINGS: Targeted ultrasound is performed throughout the upper inner quadrant
of the right breast demonstrating no cystic or solid mass to
correspond to the finding seen on MRI.
IMPRESSION: Negative ultrasound of the upper inner quadrant of the right breast.

RECOMMENDATION:
Proceed with bilateral MRI guided core needle biopsy, as previously
recommended.

I have discussed the findings and recommendations with the patient.
If applicable, a reminder letter will be sent to the patient
regarding the next appointment.

BI-RADS CATEGORY  1: Negative.

## 2020-10-22 ENCOUNTER — Other Ambulatory Visit: Payer: Self-pay | Admitting: Surgery

## 2020-10-22 ENCOUNTER — Encounter: Payer: Self-pay | Admitting: *Deleted

## 2020-10-22 DIAGNOSIS — D0512 Intraductal carcinoma in situ of left breast: Secondary | ICD-10-CM

## 2020-10-25 NOTE — Telephone Encounter (Signed)
LM on VM that results are back and to please call. 

## 2020-10-26 ENCOUNTER — Encounter: Payer: Self-pay | Admitting: Genetic Counselor

## 2020-10-26 ENCOUNTER — Telehealth: Payer: Self-pay | Admitting: Genetic Counselor

## 2020-10-26 ENCOUNTER — Ambulatory Visit: Payer: Self-pay | Admitting: Genetic Counselor

## 2020-10-26 DIAGNOSIS — Z1379 Encounter for other screening for genetic and chromosomal anomalies: Secondary | ICD-10-CM

## 2020-10-26 NOTE — Progress Notes (Signed)
HPI:  Candice Maldonado was previously seen in the [place] clinic due to a personal and family history of breast cancer and concerns regarding a hereditary predisposition to cancer. Please refer to our prior cancer genetics clinic note for more information regarding our discussion, assessment and recommendations, at the time. Candice Maldonado recent genetic test results were disclosed to her, as were recommendations warranted by these results. These results and recommendations are discussed in more detail below.  CANCER HISTORY:  Oncology History  Ductal carcinoma in situ (DCIS) of left breast  2000 Miscellaneous   history of left breast cancer in 2000 for which she underewnt a lumpectomy, chemotherapy and radiation   09/09/2020 Initial Diagnosis   Screening mammogram on 07/20/20 showed left breast calcifications. Diagnostic mammogram on 08/27/20 showed indeterminate left breast calcifications, 1.2cm, upper outer quadrant. 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     FAMILY HISTORY:  We obtained a detailed, 4-generation family history.  Significant diagnoses are listed below: Family History  Problem Relation Age of Onset  . Breast cancer Mother        dx >50  . Diabetes Mother   . Diabetes Sister   . Breast cancer Sister 30  . Diabetes Brother   . Breast cancer Maternal Grandmother        dx >60  . Irritable bowel syndrome Daughter   . Breast cancer Daughter 32  . Stroke Father   . Diabetes Father   . Breast cancer Other        MGMs sister    The patient has three daughters and one son.  One daughter had breast cancer at 58  and reportedly had possible VUS.  Another daughter reportedly had genetic testing that was negative.  She has four sisters and six brothers.  One brother had throat cancer and one sister had breast cancer at 51.  Both parents are deceased.  The patient's mother had breast cancer over the age of 4.  She was an only child.  Her mother had breast cancer and her father died of a stroke.  Her mother's sister also had breast cancer under age 63.  The patient's father died of a stroke at 86.  He had several siblings, none reported to have cancer.  Candice Maldonado is aware of previous family history of genetic testing for hereditary cancer risks. Patient's maternal ancestors are of [place] American and Caucasian descent, and paternal ancestors are of African American descent. There is no reported Ashkenazi Jewish ancestry. There is no known consanguinity.  GENETIC TEST RESULTS: Genetic testing reported out on October 18, 2020 through the CancerNext-Expanded+RNAinsight cancer panel found no pathogenic mutations. The CancerNext-Expanded gene panel offered by [facility] and includes sequencing and rearrangement analysis for the following 77 genes: AIP, ALK, APC*, ATM*, AXIN2, BAP1, BARD1, BLM, BMPR1A, BRCA1*, BRCA2*, BRIP1*, CDC73, CDH1*, CDK4, CDKN1B, CDKN2A, CHEK2*, CTNNA1, DICER1, FANCC, FH, FLCN, GALNT12, KIF1B, LZTR1, MAX, MEN1, MET, MLH1*, MSH2*, MSH3, MSH6*, MUTYH*, NBN, NF1*, NF2, NTHL1, PALB2*, PHOX2B, PMS2*, POT1, PRKAR1A, PTCH1, PTEN*, RAD51C*, RAD51D*, RB1, RECQL, RET, SDHA, SDHAF2, SDHB, SDHC, SDHD, SMAD4, SMARCA4, SMARCB1, SMARCE1, STK11, SUFU,  TMEM127, TP53*, TSC1, TSC2, VHL and XRCC2 (sequencing and deletion/duplication); EGFR, EGLN1, HOXB13, KIT, MITF, PDGFRA, POLD1, and POLE (sequencing only); EPCAM and GREM1 (deletion/duplication only). DNA and RNA analyses performed for * genes. The test report has been scanned into EPIC and is located under the Molecular Pathology section of the Results Review  tab.  A portion of the result report is included below for reference.     We discussed with Candice Maldonado that because current genetic testing is not perfect, it is possible there may be a gene mutation in one of these genes that current testing cannot detect, but that chance is small.  We also discussed, that there could be another gene that has not yet been discovered, or that we have not yet tested, that is responsible for the cancer diagnoses in the family. It is also possible there is a hereditary cause for the cancer in the family that Candice Maldonado did not inherit and therefore was not identified in her testing.  Therefore, it is important to remain in touch with cancer genetics in the future so that we can continue to offer Candice Maldonado the most up to date genetic testing.   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  At this time, it is unknown if this variant is associated with increased cancer risk or if this is a normal finding, but most variants such as this get reclassified to being inconsequential. It should not be used to make medical management decisions. With time, we suspect the lab will determine the significance of this variant, if any. If we do learn more about it, we will try to contact Candice Maldonado to discuss it further. However, it is important to stay in touch with [place] periodically and keep the address and phone number up to date.  ADDITIONAL GENETIC TESTING: We discussed with Candice Maldonado that her genetic testing was fairly extensive.  If there are genes identified to increase cancer risk that can be analyzed in the future, we would be happy to discuss and coordinate this testing at that time.    CANCER SCREENING RECOMMENDATIONS: Candice Maldonado test result is considered negative (normal).  This means that we have not identified a hereditary cause for her personal and family history of breast cancer at this time. Most  cancers happen by chance and this negative test suggests that her cancer may fall into this category.    While reassuring, this does not definitively rule out a hereditary predisposition to cancer. It is still possible that there could be genetic mutations that are undetectable by current technology. There could be genetic mutations in genes that have not been tested or identified to increase cancer risk.  Therefore, it is recommended she continue to follow the cancer management and screening guidelines provided by her oncology and primary healthcare provider.   An individual's cancer risk and medical management are not determined by genetic test results alone. Overall cancer risk assessment incorporates additional factors, including personal medical history, family history, and any available genetic information that may result in a personalized plan for cancer prevention and surveillance  RECOMMENDATIONS FOR FAMILY MEMBERS:  Individuals in this family might be at some increased risk of developing cancer, over the general population risk, simply due to the family history of cancer.  We recommended women in this family have a yearly mammogram beginning at age 62, or 93 years younger than the earliest onset of cancer, an  annual clinical breast exam, and perform monthly breast self-exams. Women in this family should also have a gynecological exam as recommended by their primary provider. All family members should be referred for colonoscopy starting at age 35.  FOLLOW-UP: Lastly, we discussed with Candice Maldonado that cancer genetics is a rapidly advancing field and it is possible that new genetic tests will be appropriate for her and/or her family members in the future. We encouraged her to remain in contact with cancer genetics on an annual basis so we can update her personal and family histories and let her know of advances in cancer genetics that may benefit this family.   Our contact number was provided. Ms.  Maldonado questions were answered to her satisfaction, and she knows she is welcome to call us at anytime with additional questions or concerns.   Roma Kayser, [place], [facility] Licensed, Certified Genetic Counselor [name]

## 2020-10-26 NOTE — Telephone Encounter (Signed)
Revealed negative genetic testing.  Discussed that we do not know why she has breast cancer or why there is cancer in the family. It could be due to a different gene that we are not testing, or maybe our current technology may not be able to pick something up.  It will be important for her to keep in contact with genetics to keep up with whether additional testing may be needed.  One VUS found that will not change medical management.

## 2020-10-26 NOTE — Telephone Encounter (Signed)
LM on VM that results are back and to please call back. Left CB number.  I will send letter with phone number.

## 2020-10-27 ENCOUNTER — Other Ambulatory Visit: Payer: Self-pay | Admitting: Surgery

## 2020-10-27 DIAGNOSIS — D0512 Intraductal carcinoma in situ of left breast: Secondary | ICD-10-CM

## 2020-10-28 ENCOUNTER — Other Ambulatory Visit: Payer: Self-pay | Admitting: Surgery

## 2020-10-28 ENCOUNTER — Encounter: Payer: Self-pay | Admitting: *Deleted

## 2020-10-29 ENCOUNTER — Other Ambulatory Visit: Payer: Self-pay | Admitting: Surgery

## 2020-10-29 DIAGNOSIS — R9389 Abnormal findings on diagnostic imaging of other specified body structures: Secondary | ICD-10-CM

## 2020-11-03 ENCOUNTER — Other Ambulatory Visit: Payer: Self-pay | Admitting: Surgery

## 2020-11-03 ENCOUNTER — Ambulatory Visit
Admission: RE | Admit: 2020-11-03 | Discharge: 2020-11-03 | Disposition: A | Discharge status: Home / Self Care | Payer: 59 | Source: Ambulatory Visit | Attending: Surgery | Admitting: Surgery

## 2020-11-03 ENCOUNTER — Other Ambulatory Visit: Payer: Self-pay

## 2020-11-03 ENCOUNTER — Ambulatory Visit: Payer: 59

## 2020-11-03 DIAGNOSIS — R9389 Abnormal findings on diagnostic imaging of other specified body structures: Secondary | ICD-10-CM

## 2020-11-03 IMAGING — MR MR BREAST BX W/ LOC DEV 1ST LEASION IMAGE BX SPEC MR GUIDE*R*
6 of 7 series · 36 of 48 positions shown · IV contrast (10 ml Gadavist)
Comparison: Previous exams.

CLINICAL DATA: 64-year-old with a personal history of malignant
lumpectomy of the LEFT breast in [9K] with adjuvant radiation
therapy and chemotherapy, recent biopsy-proven DCIS involving the
UPPER OUTER QUADRANT of the LEFT breast, with a pretreatment MRI
demonstrating a 7 mm mass in the UPPER INNER QUADRANT of the RIGHT
breast at MIDDLE depth. Recent second-look ultrasound demonstrated
no correlate for the MRI mass.

The patient is going to have a LEFT mastectomy, and biopsy of the
indeterminate RIGHT breast mass is performed.
EXAM:
ATTEMPTED MRI GUIDED CORE NEEDLE BIOPSY OF THE RIGHT
BREAST--DISCONTINUED PROCEDURE
TECHNIQUE: Multiplanar, multisequence MR imaging of the RIGHT breast was
performed both before and after administration of intravenous
contrast.
CONTRAST:  10 mL Gadavist IV.

[Series 2: fiducial unilateral · sagittal · 2.0mm · 1.33mm/px · 4 of 60 slices shown (1 of 2)]
[im 1/60]
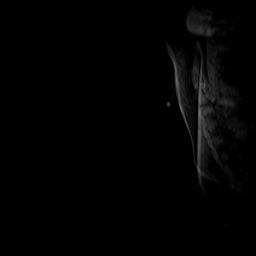
[im 20/60]
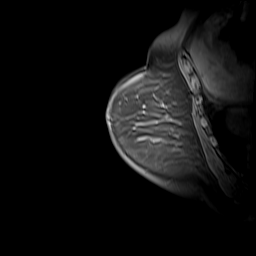
[im 40/60]
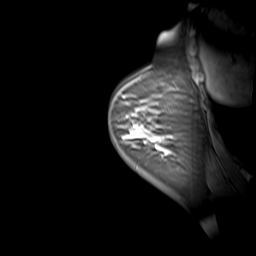
[im 60/60]
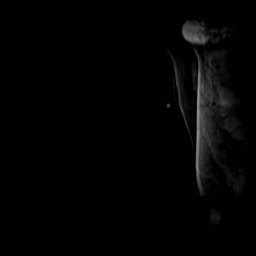

[Series 4: fiducial unilateral · sagittal · 2.0mm · 1.33mm/px · 4 of 60 slices shown (2 of 2)]
[im 1/60]
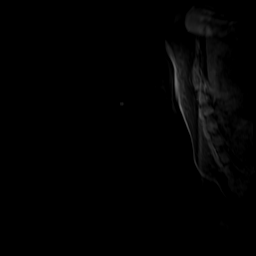
[im 20/60]
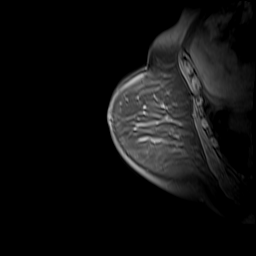
[im 40/60]
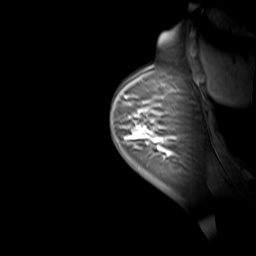
[im 60/60]
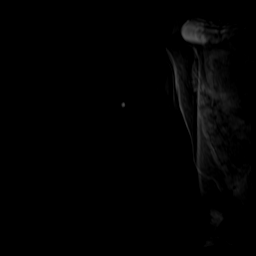

[Series 5: dynamic pre · axial · non-contrast · 1.3mm · 0.73mm/px · z∈[-113,+114]mm · 8 of 176 slices shown]
[im 1/176]
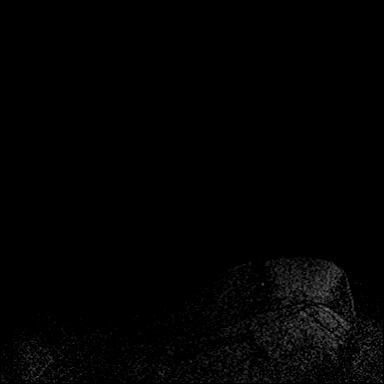
[im 26/176]
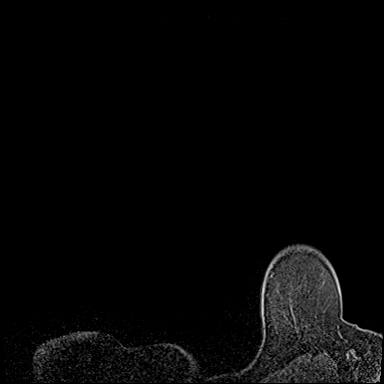
[im 51/176]
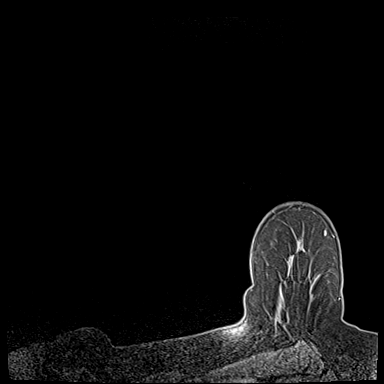
[im 76/176]
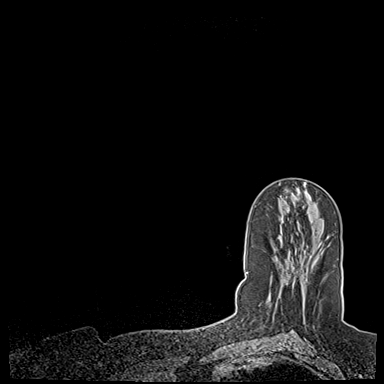
[im 101/176]
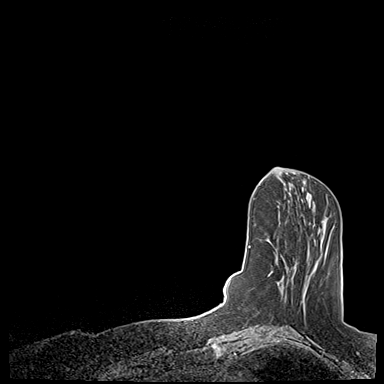
[im 126/176]
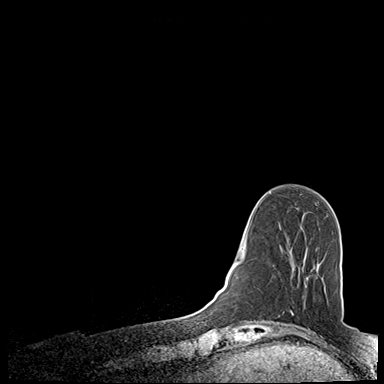
[im 151/176]
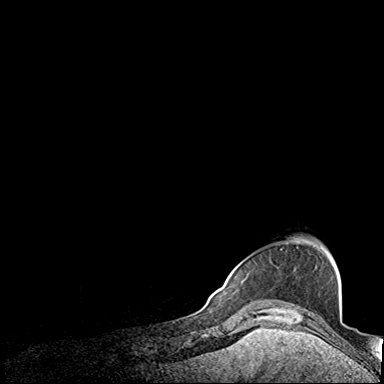
[im 176/176]
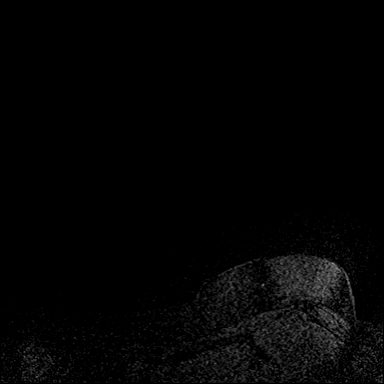

[Series 6: dynamic post 20 · axial · 1.3mm · 0.73mm/px · z∈[-113,+114]mm · 8 of 176 slices shown (1 of 2)]
[im 1/176]
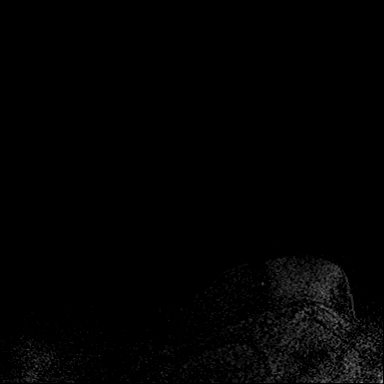
[im 26/176]
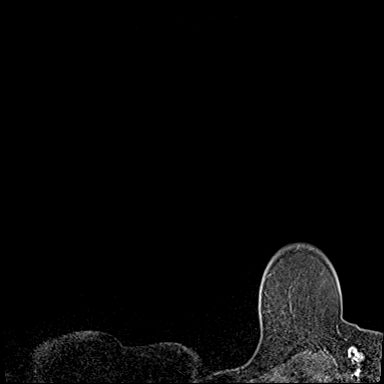
[im 51/176]
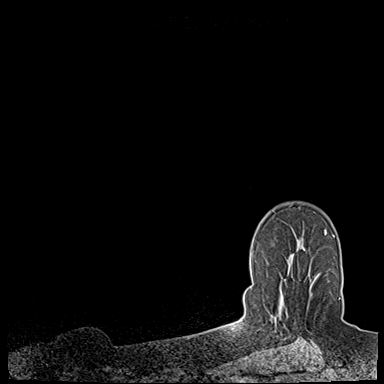
[im 76/176]
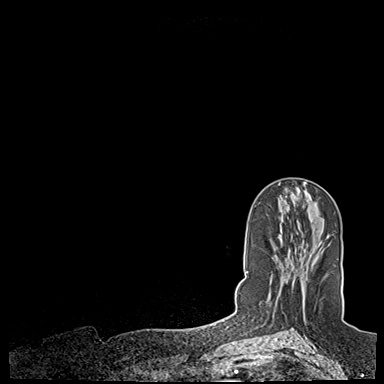
[im 101/176]
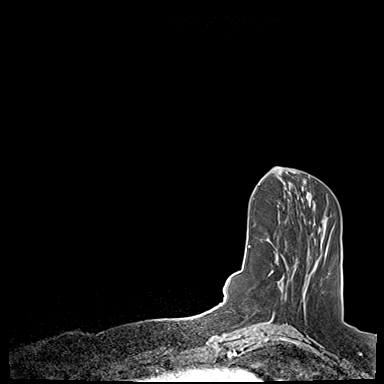
[im 126/176]
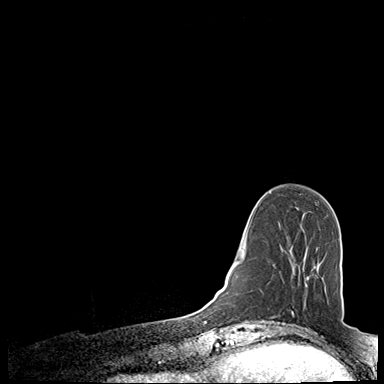
[im 151/176]
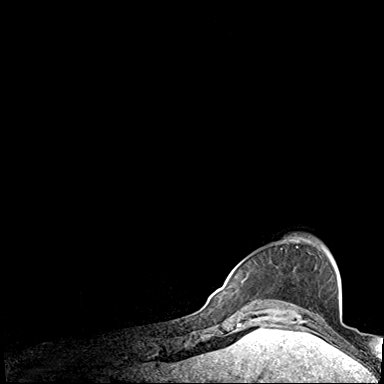
[im 176/176]
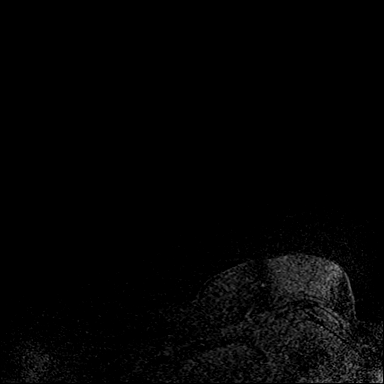

[Series 7: dynamic post 20 · axial · 1.3mm · 0.73mm/px · z∈[-113,+114]mm · 8 of 176 slices shown (2 of 2)]
[im 1/176]
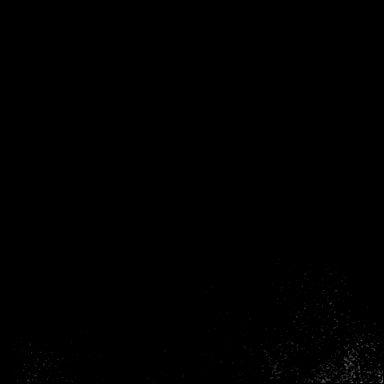
[im 26/176]
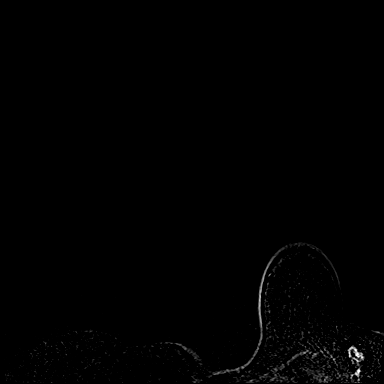
[im 51/176]
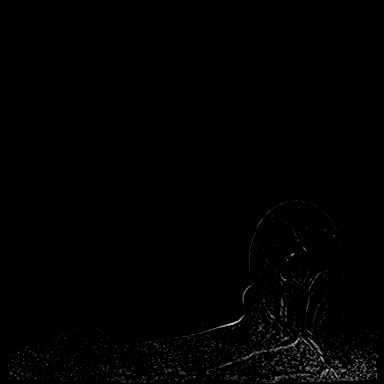
[im 76/176]
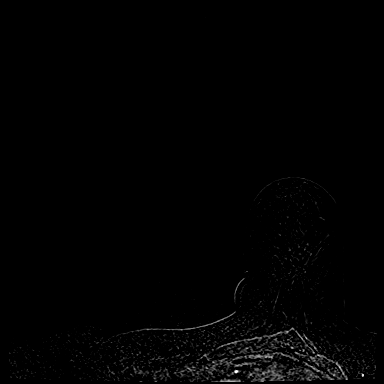
[im 101/176]
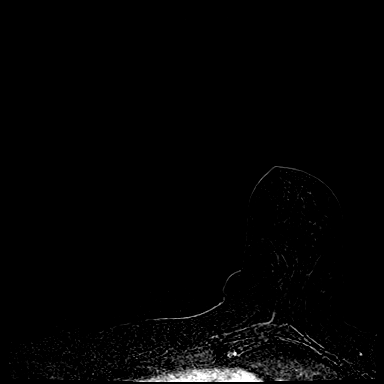
[im 126/176]
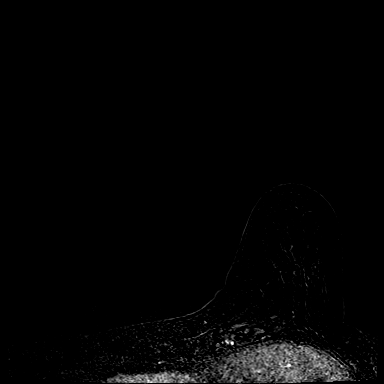
[im 151/176]
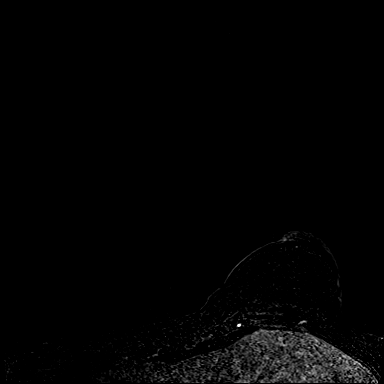
[im 176/176]
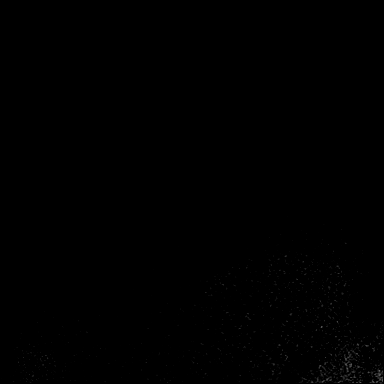

[Series 8: dynamic post 3 · axial · 1.3mm · 0.73mm/px · z∈[-113,-16]mm · 4 of 176 slices shown]
[im 1/176]
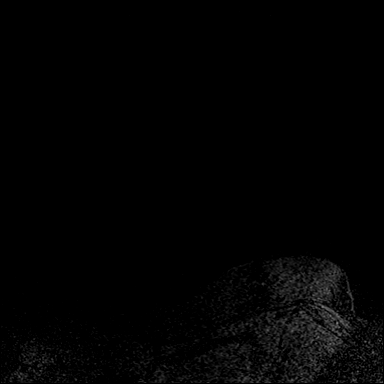
[im 26/176]
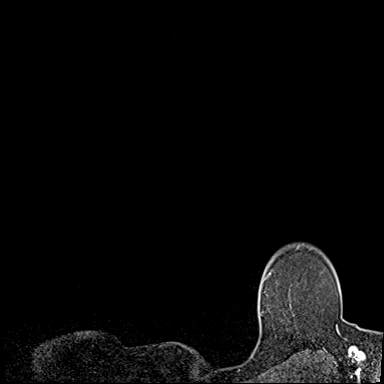
[im 51/176]
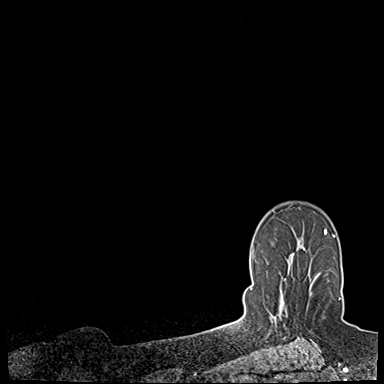
[im 76/176]
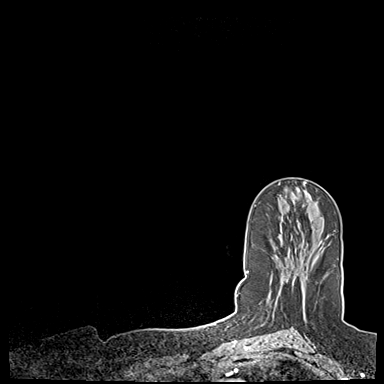

[36 of 48 positions shown; findings below may reference images not displayed]

FINDINGS: I met with the patient, and we discussed the procedure of MRI guided
biopsy, including risks, benefits, and alternatives. Specifically,
we discussed the risks of infection, bleeding, tissue injury, clip
migration, and inadequate sampling. Informed, written consent was
given. The usual time out protocol was performed immediately prior
to the procedure.

Using sterile technique with chlorhexidine as skin antisepsis, 1%
lidocaine and 1% lidocaine with epinephrine as local anesthetic,
using MRI guidance, the introducer sheath was placed into the LEFT
breast. However, the patient had a coughing episode prior to sheath
placement, and the she was approximately 5-7 mm inferior to the
enhancing mass.

At this point in the procedure, the patient complained of severe
RIGHT shoulder pain (as she had her [9K] booster vaccine
yesterday in the RIGHT deltoid). Due to the severe pain, she did not
wish to continue with the procedure.
IMPRESSION: Attempted MRI guided biopsy of an enhancing mass in the UPPER INNER
RIGHT breast at MIDDLE depth. Due to severe RIGHT shoulder pain
(related to her [9K] booster vaccine which was performed
yesterday in the RIGHT deltoid, the patient wished to discontinue
the procedure prior to its completion.

The biopsy has been rescheduled for next [REDACTED], [DATE].

## 2020-11-04 ENCOUNTER — Encounter: Payer: Self-pay | Admitting: *Deleted

## 2020-11-09 ENCOUNTER — Ambulatory Visit
Admission: RE | Admit: 2020-11-09 | Discharge: 2020-11-09 | Disposition: A | Discharge status: Home / Self Care | Payer: 59 | Source: Ambulatory Visit | Attending: Surgery | Admitting: Surgery

## 2020-11-09 ENCOUNTER — Other Ambulatory Visit: Payer: Self-pay

## 2020-11-09 ENCOUNTER — Other Ambulatory Visit: Payer: Self-pay | Admitting: Diagnostic Radiology

## 2020-11-09 DIAGNOSIS — R9389 Abnormal findings on diagnostic imaging of other specified body structures: Secondary | ICD-10-CM

## 2020-11-09 IMAGING — MG MM BREAST LOCALIZATION CLIP
4 series · 4 of 12 positions shown · non-contrast
Comparison: Previous exam(s).

CLINICAL DATA: Patient status post MRI guided core needle biopsy
right breast mass.

EXAM:
DIAGNOSTIC RIGHT MAMMOGRAM POST MRI BIOPSY

[R CC synth-2D]
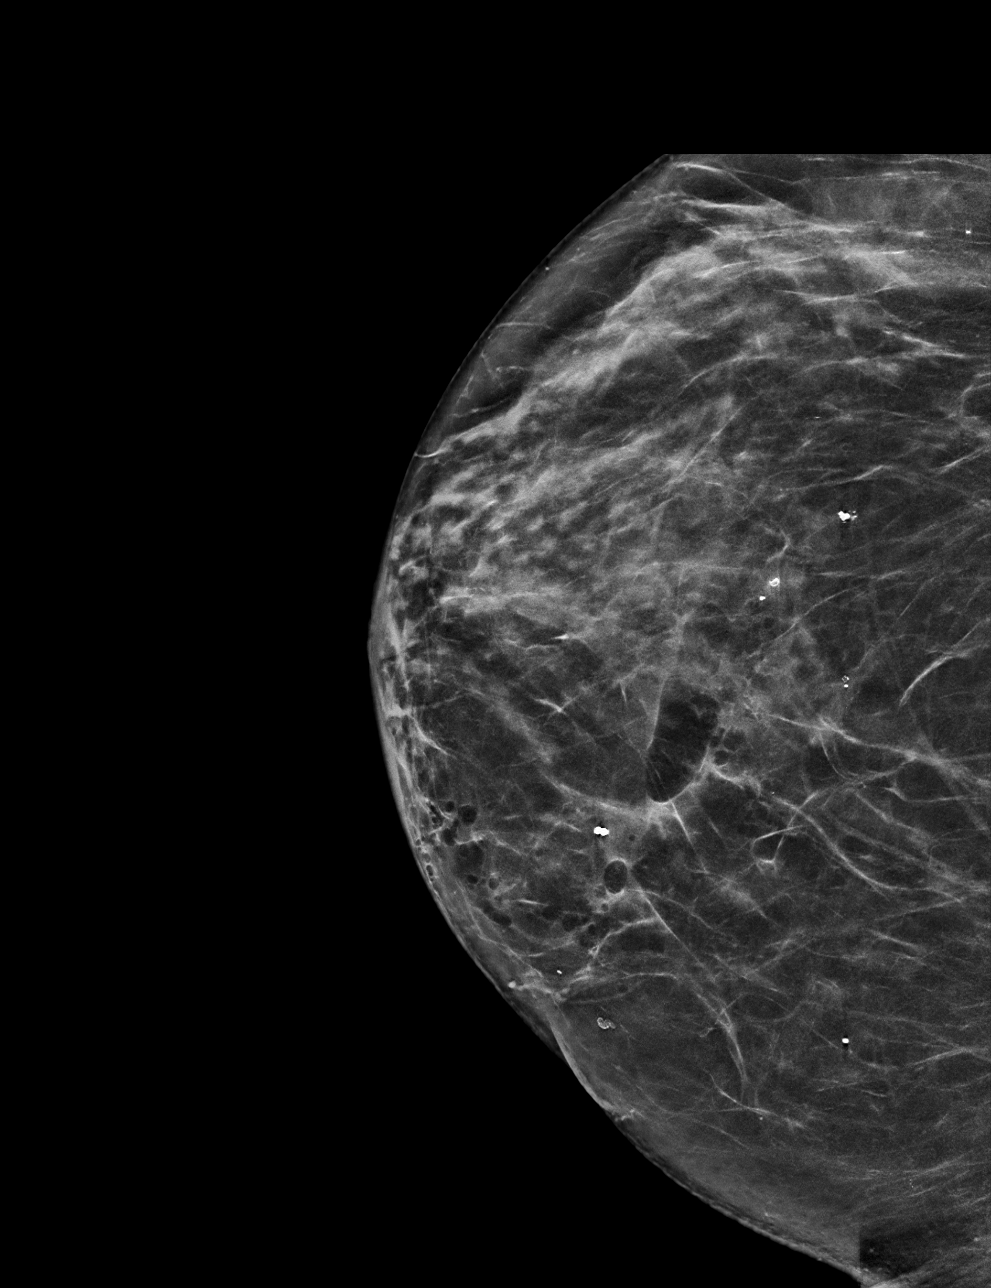

[R ML synth-2D]
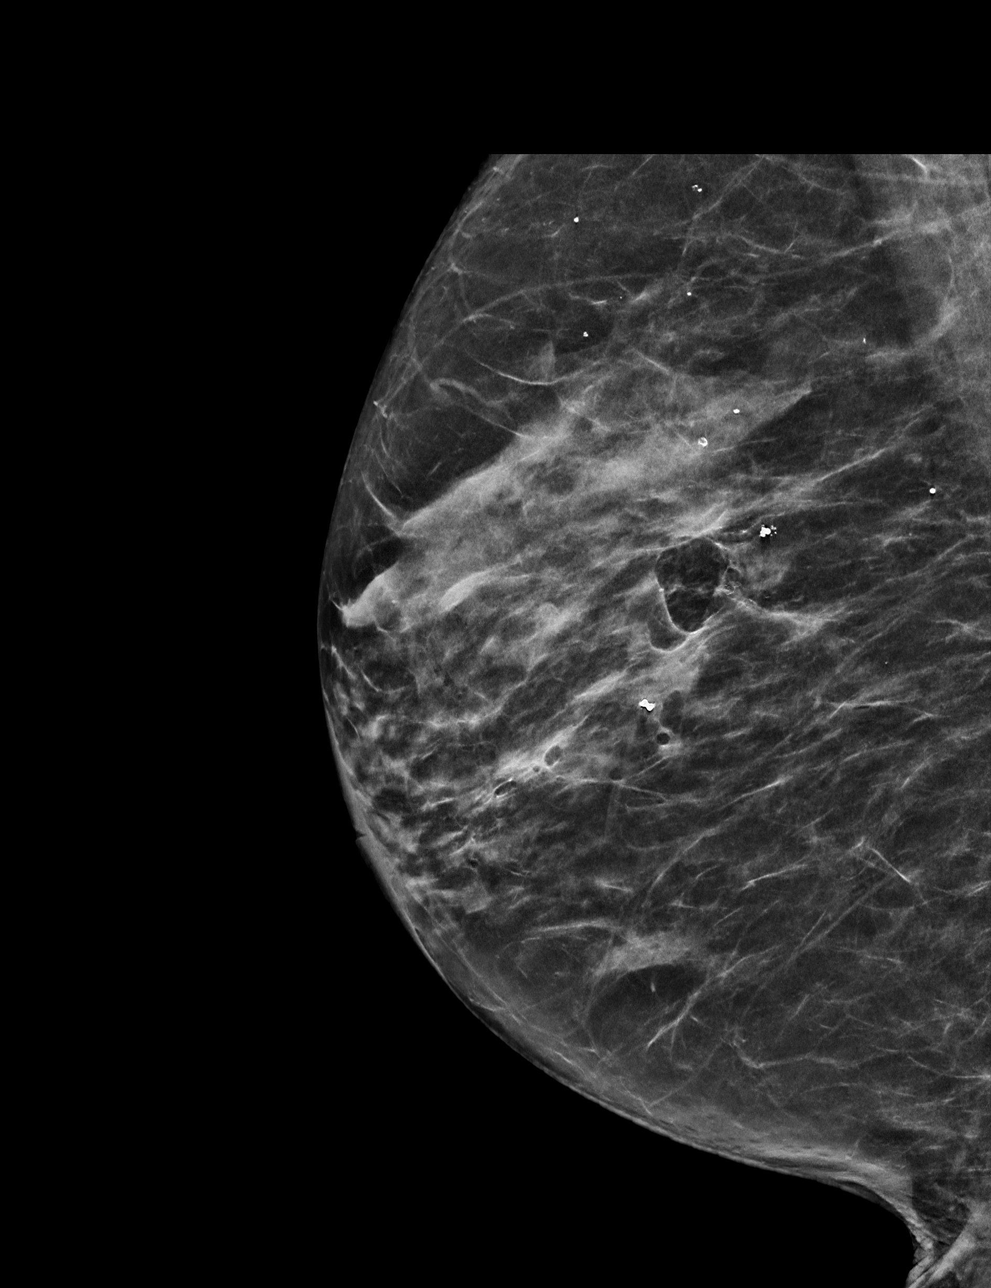

[R CC tomo · tomo slice 33/65.0]
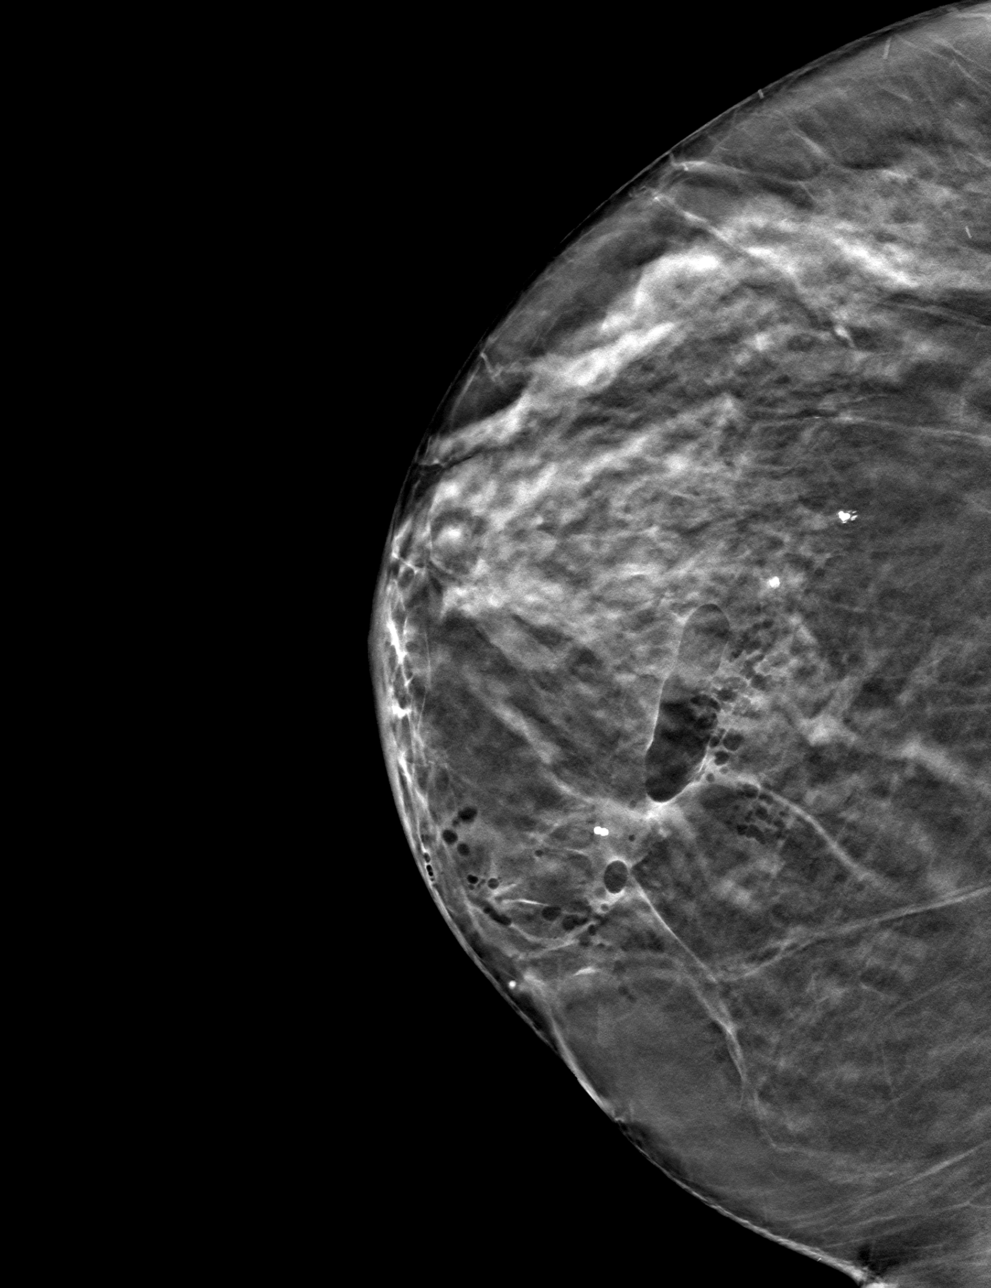

[R ML tomo · tomo slice 31/62.0]
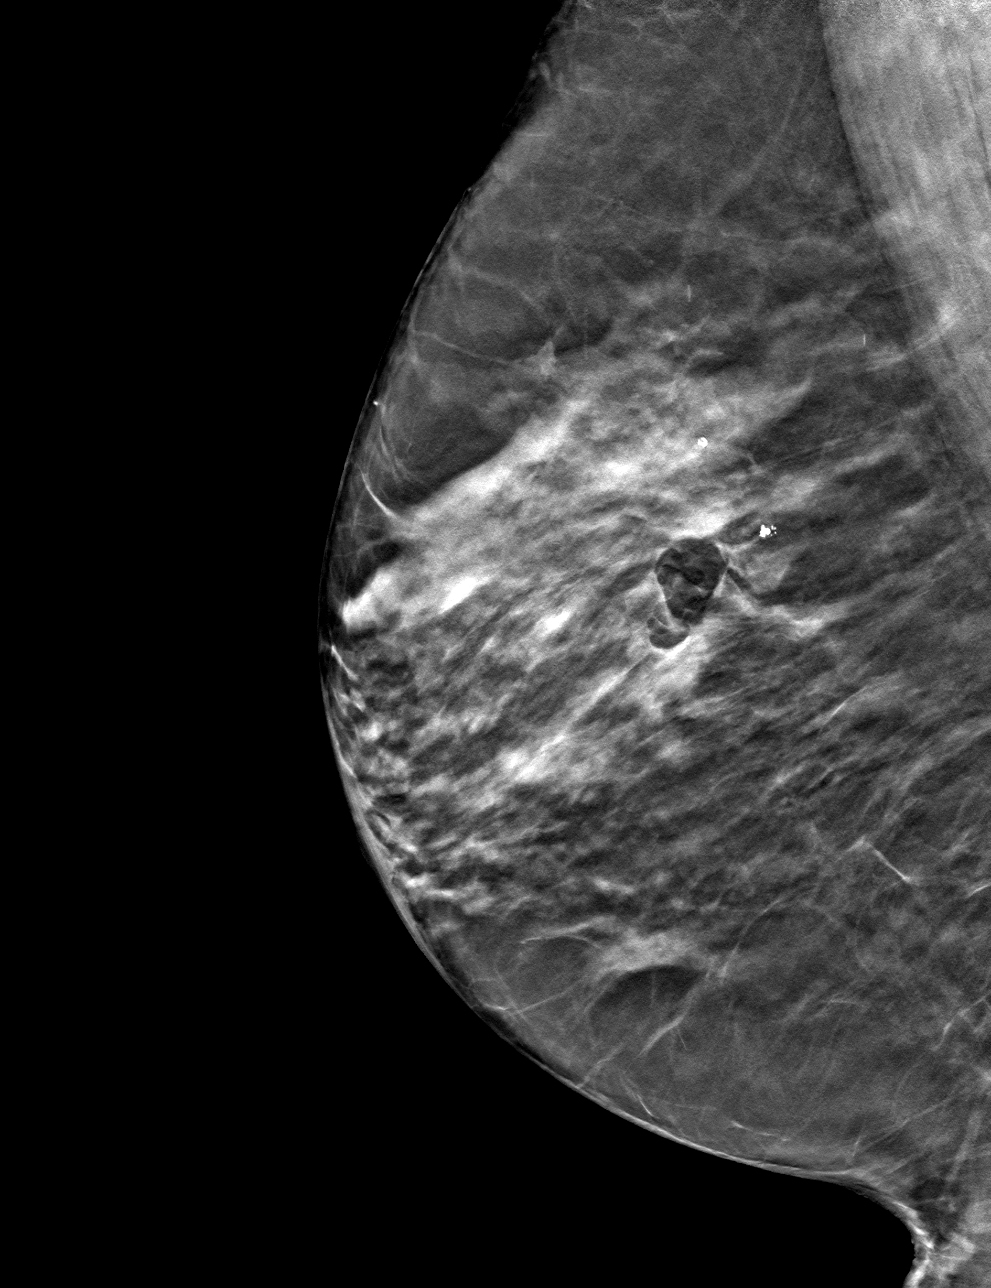

[4 of 12 positions shown; findings below may reference images not displayed]

FINDINGS: Mammographic images were obtained following MRI guided biopsy of
right breast mass. The biopsy marking clip is in expected position
at the site of biopsy.
IMPRESSION: Appropriate positioning of the barbell shaped biopsy marking clip at
the site of biopsy in the medial right breast.

Final Assessment: Post Procedure Mammograms for Marker Placement

## 2020-11-09 IMAGING — MR MR BREAST BX W/ LOC DEV 1ST LEASION IMAGE BX SPEC MR GUIDE*R*
9 of 12 series · 33 of 48 positions shown · IV contrast (9 ml Gadavist)
Comparison: Previous exams.
COMPARISON: Previous exams.

Addendum:
CLINICAL DATA: Patient with indeterminate right breast mass, for
MRI guided core needle biopsy.

EXAM:
MRI GUIDED CORE NEEDLE BIOPSY OF THE RIGHT BREAST
TECHNIQUE: Multiplanar, multisequence MR imaging of the right breast was
performed both before and after administration of intravenous
contrast.
CONTRAST:  9mL GADAVIST GADOBUTROL 1 MMOL/ML IV SOLN

[Series 2: fiducial unilateral · sagittal · 2.0mm · 1.33mm/px · 2 of 52 slices shown]
[im 1/52]
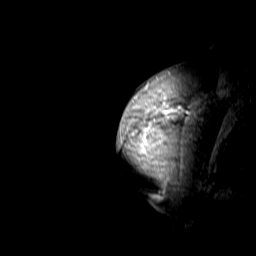
[im 52/52]
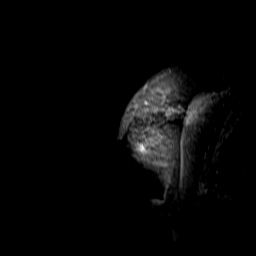

[Series 3: dynamic pre · axial · non-contrast · 1.3mm · 0.73mm/px · z∈[-92,+114]mm · 5 of 160 slices shown]
[im 1/160]
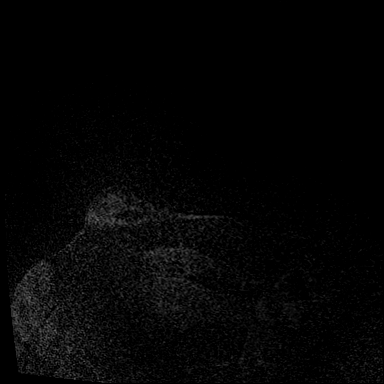
[im 40/160]
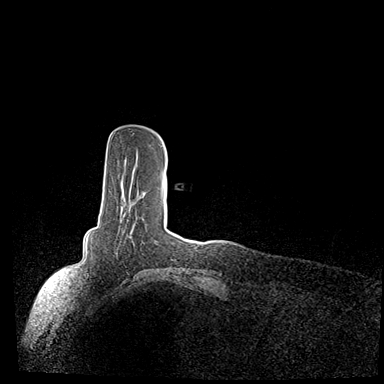
[im 80/160]
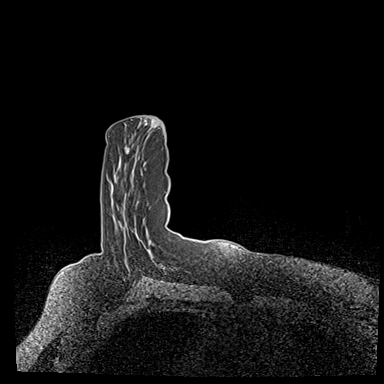
[im 120/160]
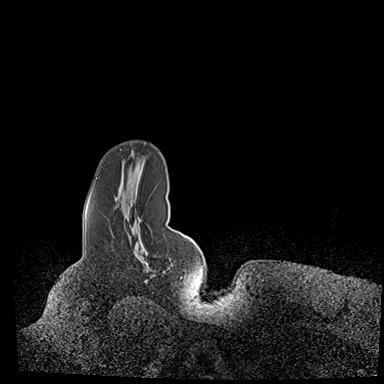
[im 160/160]
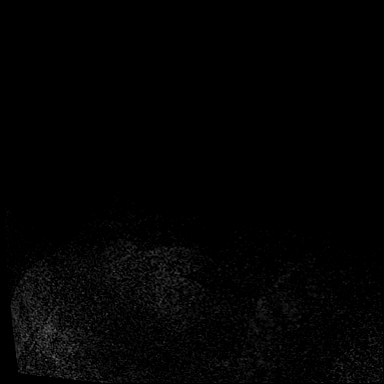

[Series 4: dynamic post 20 · axial · 1.3mm · 0.73mm/px · z∈[-92,+114]mm · 5 of 160 slices shown (1 of 2)]
[im 1/160]
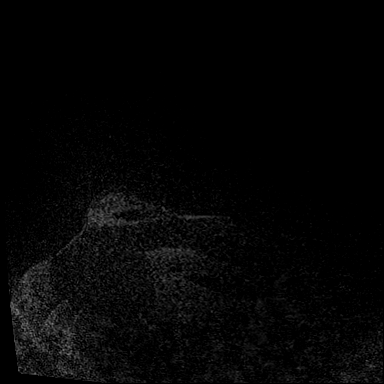
[im 40/160]
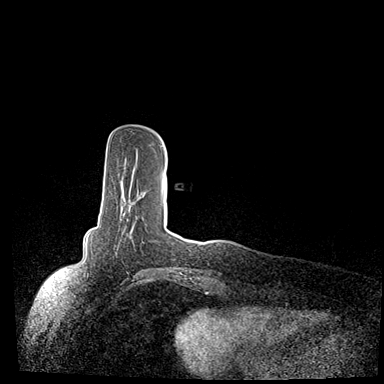
[im 80/160]
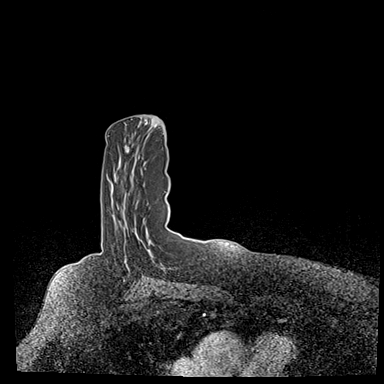
[im 120/160]
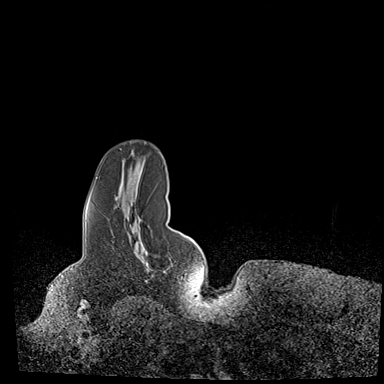
[im 160/160]
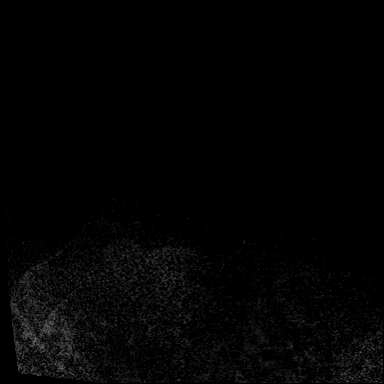

[Series 5: dynamic post 20 · axial · 1.3mm · 0.73mm/px · z∈[-92,+114]mm · 4 of 160 slices shown (2 of 2)]
[im 1/160]
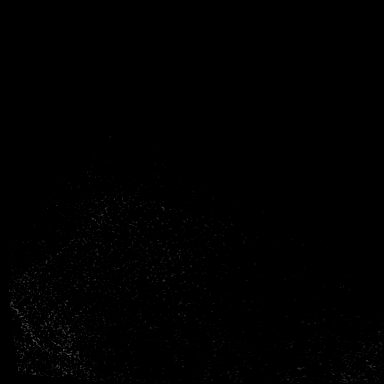
[im 54/160]
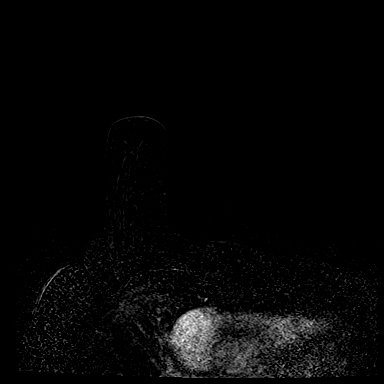
[im 107/160]
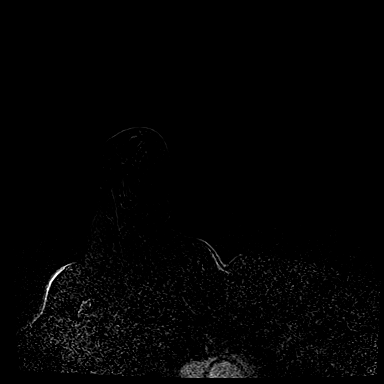
[im 160/160]
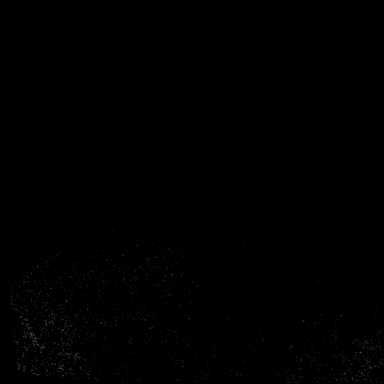

[Series 6: dynamic post 3 · axial · 1.3mm · 0.73mm/px · z∈[-92,+114]mm · 4 of 160 slices shown (1 of 2)]
[im 1/160]
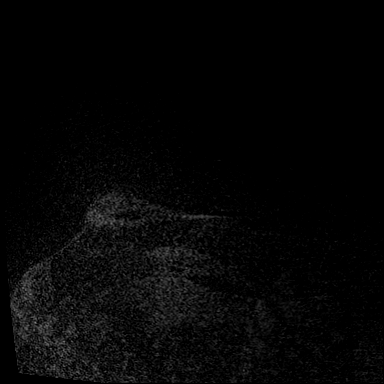
[im 54/160]
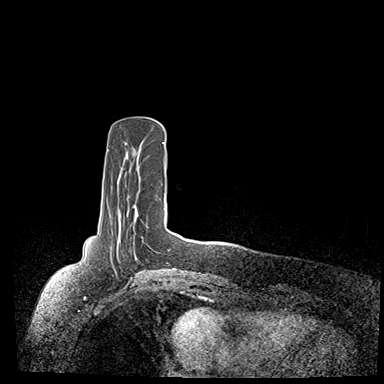
[im 107/160]
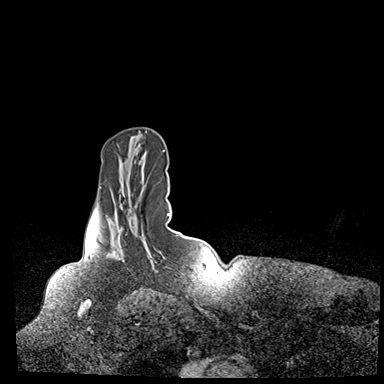
[im 160/160]
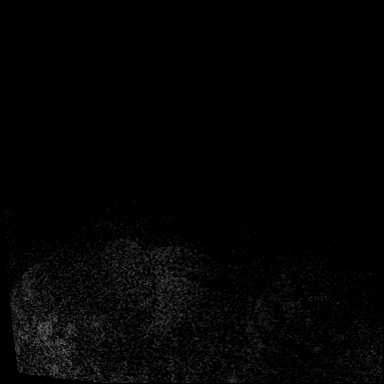

[Series 7: dynamic post 3 · axial · 1.3mm · 0.73mm/px · z∈[-92,+114]mm · 4 of 160 slices shown (2 of 2)]
[im 1/160]
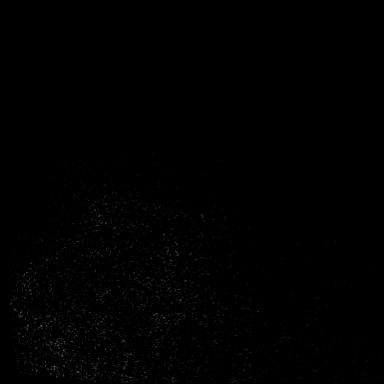
[im 54/160]
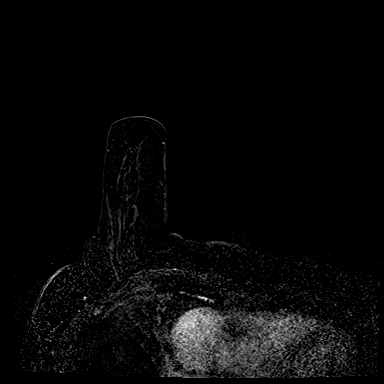
[im 107/160]
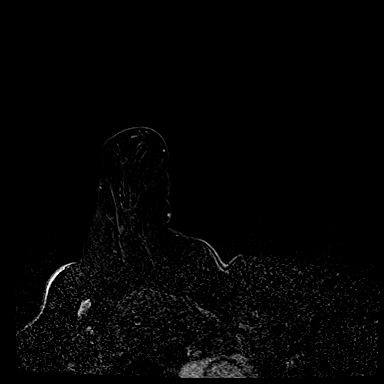
[im 160/160]
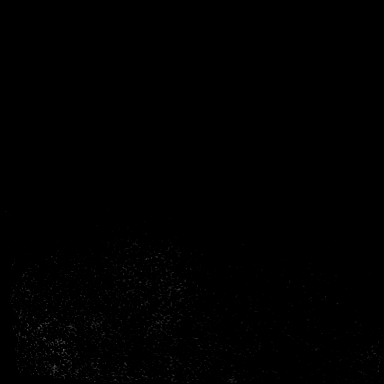

[Series 8: 5 min · axial · 1.3mm · 0.73mm/px · z∈[-92,+114]mm · 4 of 160 slices shown]
[im 1/160]
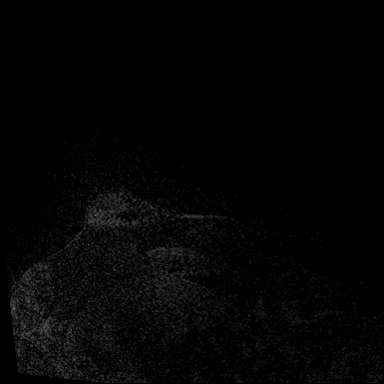
[im 54/160]
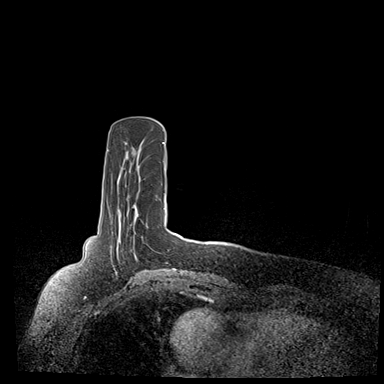
[im 107/160]
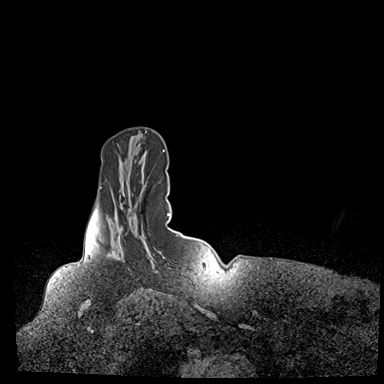
[im 160/160]
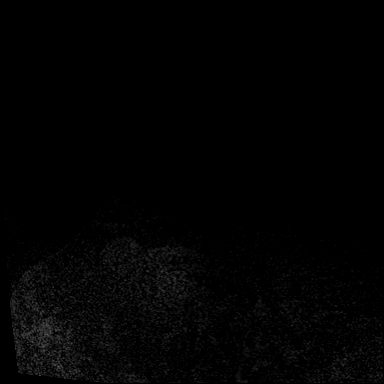

[Series 9: 5 min_sub · axial · 1.3mm · 0.73mm/px · z∈[-92,+114]mm · 4 of 160 slices shown]
[im 1/160]
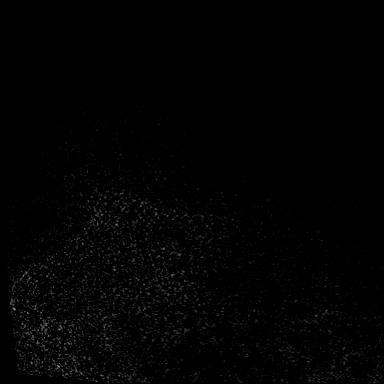
[im 54/160]
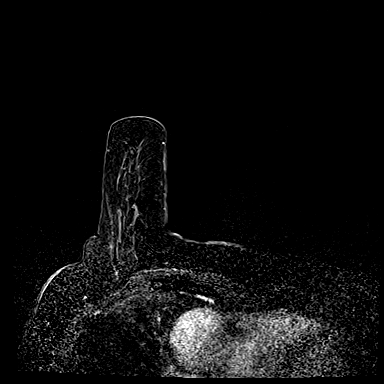
[im 107/160]
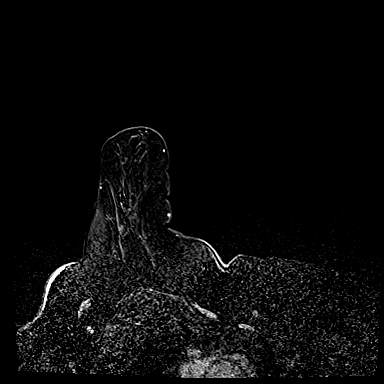
[im 160/160]
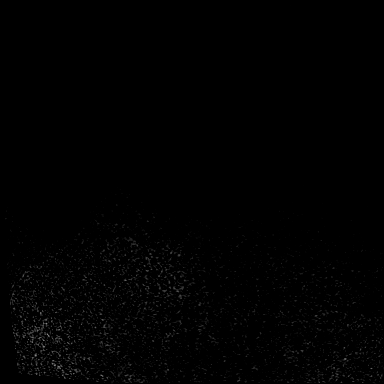

[Series 10: needle placement · axial · 1.3mm · 0.73mm/px · 1 of 160 slices shown]
[im 1/160]
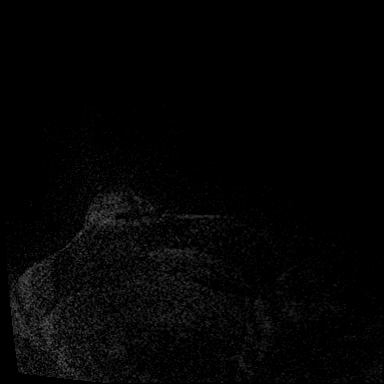

[33 of 48 positions shown; findings below may reference images not displayed]

FINDINGS: I met with the patient, and we discussed the procedure of MRI guided
biopsy, including risks, benefits, and alternatives. Specifically,
we discussed the risks of infection, bleeding, tissue injury, clip
migration, and inadequate sampling. Informed, written consent was
given. The usual time out protocol was performed immediately prior
to the procedure.

Using sterile technique, 1% Lidocaine, MRI guidance, and a 9 gauge
vacuum assisted device, biopsy was performed of mass within the
medial right breast using a medial approach. At the conclusion of
the procedure, a barbell tissue marker clip was deployed into the
biopsy cavity. Follow-up 2-view mammogram was performed and dictated
separately.
IMPRESSION: MRI guided biopsy of mass within the medial right breast. No
apparent complications.

ADDENDUM:
Pathology revealed FIBROCYSTIC AND FIBROADENOMATOID CHANGES of the
RIGHT breast, medial. This was found to be concordant by Dr. SANCUM
SANCUM.

Pathology results were discussed with the patient by telephone. The
patient reported doing well after the biopsy with tenderness at the
site. Post biopsy instructions and care were reviewed and questions
were answered. The patient was encouraged to call The [REDACTED] for any additional concerns. My direct phone
number was provided.

The patient has a recent diagnosis of LEFT breast cancer and should
follow her outlined treatment plan. Dr. SANCUM was notified
of biopsy results via [REDACTED] message on [DATE].

The patient was instructed to return for a bilateral breast MRI in 6
months, per protocol.

Pathology results reported by SANCUM, RN on [DATE].

*** End of Addendum ***
FINDINGS: I met with the patient, and we discussed the procedure of MRI guided
biopsy, including risks, benefits, and alternatives. Specifically,
we discussed the risks of infection, bleeding, tissue injury, clip
migration, and inadequate sampling. Informed, written consent was
given. The usual time out protocol was performed immediately prior
to the procedure.

Using sterile technique, 1% Lidocaine, MRI guidance, and a 9 gauge
vacuum assisted device, biopsy was performed of mass within the
medial right breast using a medial approach. At the conclusion of
the procedure, a barbell tissue marker clip was deployed into the
biopsy cavity. Follow-up 2-view mammogram was performed and dictated
separately.
IMPRESSION: MRI guided biopsy of mass within the medial right breast. No
apparent complications.

## 2020-11-09 MED ORDER — GADOBUTROL 1 MMOL/ML IV SOLN
9.0000 mL | Freq: Once | INTRAVENOUS | Status: AC | PRN
  Administered 2020-11-09: 9 mL via INTRAVENOUS

## 2020-11-10 ENCOUNTER — Encounter: Payer: Self-pay | Admitting: *Deleted

## 2020-11-11 ENCOUNTER — Ambulatory Visit: Payer: Self-pay | Admitting: Surgery

## 2020-11-11 NOTE — H&P (View-Only) (Signed)
History of Present Illness Candice Maldonado. Rhapsody Wolven MD; 09/24/2020 9:02 PM) The patient is a 65 year old female who presents with breast cancer. PCP - Dr. Dustin Folks Referred by Esther Hardy, NP for left DCIS  This is a 65 year old female with an extensive history who presents with a recent diagnosis of left breast cancer. She is a moderately poor historian as she does not remember many details about her previous cancer treatment. She is accompanied by one of her daughters who helps to fill in some details, but we have no records from her previous cancer treatment.  This patient has a maternal grandmother, mother, sister, and daughter who have all had breast cancer. Apparently, when her daughter was diagnosed with breast cancer in 2009, the patient was living in [place]. She thinks she had some genetic testing but is unaware of any results from that testing.   In 2000, the patient was living near [place], [place] and underwent surgery at [facility] for a left breast cancer. Apparently, she had a left lumpectomy and left axillary lymph node dissection (10-11 nodes). Subsequently, she underwent chemo and radiation at [facility] in [place]. We have none of those records. The patient has some residual peripheral neuropathy.  Recently, the patient underwent screening mammogram that showed some suspicious microcalcifications in the left upper outer quadrant spanning 1.2 cm. 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 She comes in today for her initial visit. She began the visit by stating "I want both breasts gone."   Problem List/Past Medical Candice Maldonado. Leevon Upperman, MD; 09/24/2020 9:02 PM) DUCTAL CARCINOMA IN SITU (DCIS) OF LEFT BREAST (D05.12)  HISTORY OF LEFT BREAST CANCER (Z85.3)   Past Surgical History (Chanel Teressa Senter, CMA; 09/24/2020 11:43 AM) Breast Biopsy  Left. multiple Breast Mass;  Local Excision  Left. Gallbladder Surgery - Open  Sentinel Lymph Node Biopsy  Spinal Surgery - Lower Back   Diagnostic Studies History (Chanel Teressa Senter, CMA; 09/24/2020 11:43 AM) Colonoscopy  1-5 years ago Mammogram  within last year Pap Smear  1-5 years ago  Allergies ([place]arton, CMA; 09/24/2020 11:43 AM) No Known Drug Allergies  [09/24/2020]: Allergies Reconciled   Medication History (Chanel Teressa Senter, CMA; 09/24/2020 11:45 AM) Atorvastatin Calcium (10MG  Tablet, Oral) Active. hydroCHLOROthiazide (25MG  Tablet, Oral) Active. Omeprazole (20MG  Capsule DR, Oral) Active. Pregabalin (200MG  Capsule, Oral) Active. Medications Reconciled  Social History (Chanel Teressa Senter, CMA; 09/24/2020 11:43 AM) Caffeine use  Carbonated beverages, Coffee, Tea. No alcohol use  No drug use  Tobacco use  Never smoker.  Family History Antonietta Jewel, [place]; 09/24/2020 11:43 AM) Alcohol Abuse  Father. Anesthetic complications  Daughter. Arthritis  Brother, Daughter, Father, Mother, Sister. Bleeding disorder  Daughter, Sister. Breast Cancer  Daughter, Mother, Sister. Cerebrovascular Accident  Brother, Mother, Sister. Cervical Cancer  Daughter. Diabetes Mellitus  Brother, Mother, Sister. Heart Disease  Brother, Father, Mother, Sister. Heart disease in female family member before age 22  Heart disease in female family member before age 24  Hypertension  Brother, Father, Mother, Sister. Kidney Disease  Brother, Mother. Melanoma  Mother. Seizure disorder  Brother.  Pregnancy / Birth History Antonietta Jewel, CMA; 09/24/2020 11:43 AM) Age of menopause  2-50 Gravida  4 Irregular periods  Maternal age  85-20 Para  4  Other Problems Candice Maldonado. Kedra Mcglade, MD; 09/24/2020 9:02 PM) Arthritis  Back Pain  Breast Cancer  Diabetes Mellitus  Gastric Ulcer  Gastroesophageal Reflux Disease  Hypercholesterolemia  Lump  In Breast  Transfusion history     Review of Systems  (Chanel Teressa Senter CMA; 09/24/2020 11:43 AM) General Not Present- Appetite Loss, Chills, Fatigue, Fever, Night Sweats, Weight Gain and Weight Loss. Skin Not Present- Change in Wart/Mole, Dryness, Hives, Jaundice, New Lesions, Non-Healing Wounds, Rash and Ulcer. HEENT Present- Wears glasses/contact lenses. Not Present- Earache, Hearing Loss, Hoarseness, Nose Bleed, Oral Ulcers, Ringing in the Ears, Seasonal Allergies, Sinus Pain, Sore Throat, Visual Disturbances and Yellow Eyes. Respiratory Present- Snoring. Not Present- Bloody sputum, Chronic Cough, Difficulty Breathing and Wheezing. Breast Present- Breast Mass. Not Present- Breast Pain, Nipple Discharge and Skin Changes. Cardiovascular Present- Swelling of Extremities. Not Present- Chest Pain, Difficulty Breathing Lying Down, Leg Cramps, Palpitations, Rapid Heart Rate and Shortness of Breath. Gastrointestinal Not Present- Abdominal Pain, Bloating, Bloody Stool, Change in Bowel Habits, Chronic diarrhea, Constipation, Difficulty Swallowing, Excessive gas, Gets full quickly at meals, Hemorrhoids, Indigestion, Nausea, Rectal Pain and Vomiting. Female Genitourinary Present- Nocturia. Not Present- Frequency, Painful Urination, Pelvic Pain and Urgency. Musculoskeletal Present- Back Pain, Joint Pain, Joint Stiffness and Swelling of Extremities. Not Present- Muscle Pain and Muscle Weakness. Psychiatric Not Present- Anxiety, Bipolar, Change in Sleep Pattern, Depression, Fearful and Frequent crying. Endocrine Present- Cold Intolerance and Hot flashes. Not Present- Excessive Hunger, Hair Changes, Heat Intolerance and New Diabetes. Hematology Present- Blood Thinners and Easy Bruising. Not Present- Excessive bleeding, Gland problems, HIV and Persistent Infections.  Vitals (Chanel Nolan CMA; 09/24/2020 11:45 AM) 09/24/2020 11:45 AM Weight: 226.25 lb Height: 63in Body Surface Area: 2.04 m Body Mass Index: 40.08 kg/m  Temp.: 97.37F  Pulse: 101 (Regular)         Physical Exam Rodman Key K. Naitik Hermann MD; 09/24/2020 9:04 PM) The physical exam findings are as follows: Note: Constitutional: WDWN in NAD, conversant, no obvious deformities; resting comfortably Eyes: Pupils equal, round; sclera anicteric; moist conjunctiva; no lid lag HENT: Oral mucosa moist; good dentition Neck: No masses palpated, trachea midline; no thyromegaly Lungs: CTA bilaterally; normal respiratory effort Breasts: right breast - mild fibrocystic changes; no dominant masses; chronic nipple retraction; no nipple discharge; no lymphadenopathy left breast - contracted lumpectomy scar with slight deformity; chronic nipple retraction; firmness in LUOQ spanning 2.5 cm. The patient states that this has been there a long time. Healed left axillary incision with no palpable lymphadenopathy CV: Regular rate and rhythm; no murmurs; extremities well-perfused with no edema Abd: +bowel sounds, obese, soft, non-tender, no palpable organomegaly; no palpable hernias Musc: Normal gait; no apparent clubbing or cyanosis in extremities Lymphatic: No palpable cervical or axillary lymphadenopathy Skin: Warm, dry; no sign of jaundice Psychiatric - alert and oriented x 4; calm mood and affect    Assessment & Plan Rodman Key K. Rogen Porte MD; 09/24/2020 9:09 PM) DUCTAL CARCINOMA IN SITU (DCIS) OF LEFT BREAST (D05.12) Current Plans  HISTORY OF LEFT BREAST CANCER (Z85.3) Impression: 2001 - [place], [place] - treated at [facility] with chemo/ radiation Lumpectomy/ ALND   Genetics negative MRI revealed possible right breast mass - biopsy was benign  Patient requests bilateral mastectomies.  No need for left SLNB since she had a dissection already.  Will proceed with left mastectomy/ right prophylactic mastectomy.  The surgical procedure has been discussed with the patient.  Potential risks, benefits, alternative treatments, and expected outcomes have been explained.  All of the patient's questions at  this time have been answered.  The likelihood of reaching the patient's treatment goal is good.  The patient understand the proposed surgical procedure and wishes to proceed.  Lynsay Fesperman K. Clementina Mareno, MD, FACS [facility]  General/ Trauma Surgery   11/11/2020 9:57 AM  

## 2020-11-11 NOTE — H&P (Signed)
History of Present Illness Candice Maldonado. Candice Hazelrigg MD; 09/24/2020 9:02 PM) The patient is a 65 year old female who presents with breast cancer. PCP - Dr. Dustin Maldonado Referred by Candice Hardy, NP for left DCIS  This is a 65 year old female with an extensive history who presents with a recent diagnosis of left breast cancer. She is a moderately poor historian as she does not remember many details about her previous cancer treatment. She is accompanied by one of her daughters who helps to fill in some details, but we have no records from her previous cancer treatment.  This patient has a maternal grandmother, mother, sister, and daughter who have all had breast cancer. Apparently, when her daughter was diagnosed with breast cancer in 2009, the patient was living in [place]. She thinks she had some genetic testing but is unaware of any results from that testing.   In 2000, the patient was living near [place], [place] and underwent surgery at [facility] for a left breast cancer. Apparently, she had a left lumpectomy and left axillary lymph node dissection (10-11 nodes). Subsequently, she underwent chemo and radiation at [facility] in [place]. We have none of those records. The patient has some residual peripheral neuropathy.  Recently, the patient underwent screening mammogram that showed some suspicious microcalcifications in the left upper outer quadrant spanning 1.2 cm. 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 She comes in today for her initial visit. She began the visit by stating "I want both breasts gone."   Problem List/Past Medical Candice Maldonado. Candice Nickey, MD; 09/24/2020 9:02 PM) DUCTAL CARCINOMA IN SITU (DCIS) OF LEFT BREAST (D05.12)  HISTORY OF LEFT BREAST CANCER (Z85.3)   Past Surgical History (Candice Maldonado, CMA; 09/24/2020 11:43 AM) Breast Biopsy  Left. multiple Breast Mass;  Local Excision  Left. Gallbladder Surgery - Open  Sentinel Lymph Node Biopsy  Spinal Surgery - Lower Back   Diagnostic Studies History (Candice Maldonado, CMA; 09/24/2020 11:43 AM) Colonoscopy  1-5 years ago Mammogram  within last year Pap Smear  1-5 years ago  Allergies (Candice Maldonado, CMA; 09/24/2020 11:43 AM) No Known Drug Allergies  [09/24/2020]: Allergies Reconciled   Medication History (Candice Maldonado, CMA; 09/24/2020 11:45 AM) Atorvastatin Calcium (10MG  Tablet, Oral) Active. hydroCHLOROthiazide (25MG  Tablet, Oral) Active. Omeprazole (20MG  Capsule DR, Oral) Active. Pregabalin (200MG  Capsule, Oral) Active. Medications Reconciled  Social History (Candice Maldonado, CMA; 09/24/2020 11:43 AM) Caffeine use  Carbonated beverages, Coffee, Tea. No alcohol use  No drug use  Tobacco use  Never smoker.  Family History Candice Maldonado, Candice Maldonado; 09/24/2020 11:43 AM) Alcohol Abuse  Father. Anesthetic complications  Daughter. Arthritis  Brother, Daughter, Father, Mother, Sister. Bleeding disorder  Daughter, Sister. Breast Cancer  Daughter, Mother, Sister. Cerebrovascular Accident  Brother, Mother, Sister. Cervical Cancer  Daughter. Diabetes Mellitus  Brother, Mother, Sister. Heart Disease  Brother, Father, Mother, Sister. Heart disease in female family member before age 22  Heart disease in female family member before age 24  Hypertension  Brother, Father, Mother, Sister. Kidney Disease  Brother, Mother. Melanoma  Mother. Seizure disorder  Brother.  Pregnancy / Birth History Candice Maldonado, CMA; 09/24/2020 11:43 AM) Age of menopause  2-50 Gravida  4 Irregular periods  Maternal age  85-20 Para  4  Other Problems Candice Maldonado. Candice Wentworth, MD; 09/24/2020 9:02 PM) Arthritis  Back Pain  Breast Cancer  Diabetes Mellitus  Gastric Ulcer  Gastroesophageal Reflux Disease  Hypercholesterolemia  Lump  In Breast  Transfusion history     Review of Systems  (Candice Maldonado CMA; 09/24/2020 11:43 AM) General Not Present- Appetite Loss, Chills, Fatigue, Fever, Night Sweats, Weight Gain and Weight Loss. Skin Not Present- Change in Wart/Mole, Dryness, Hives, Jaundice, New Lesions, Non-Healing Wounds, Rash and Ulcer. HEENT Present- Wears glasses/contact lenses. Not Present- Earache, Hearing Loss, Hoarseness, Nose Bleed, Oral Ulcers, Ringing in the Ears, Seasonal Allergies, Sinus Pain, Sore Throat, Visual Disturbances and Yellow Eyes. Respiratory Present- Snoring. Not Present- Bloody sputum, Chronic Cough, Difficulty Breathing and Wheezing. Breast Present- Breast Mass. Not Present- Breast Pain, Nipple Discharge and Skin Changes. Cardiovascular Present- Swelling of Extremities. Not Present- Chest Pain, Difficulty Breathing Lying Down, Leg Cramps, Palpitations, Rapid Heart Rate and Shortness of Breath. Gastrointestinal Not Present- Abdominal Pain, Bloating, Bloody Stool, Change in Bowel Habits, Chronic diarrhea, Constipation, Difficulty Swallowing, Excessive gas, Gets full quickly at meals, Hemorrhoids, Indigestion, Nausea, Rectal Pain and Vomiting. Female Genitourinary Present- Nocturia. Not Present- Frequency, Painful Urination, Pelvic Pain and Urgency. Musculoskeletal Present- Back Pain, Joint Pain, Joint Stiffness and Swelling of Extremities. Not Present- Muscle Pain and Muscle Weakness. Psychiatric Not Present- Anxiety, Bipolar, Change in Sleep Pattern, Depression, Fearful and Frequent crying. Endocrine Present- Cold Intolerance and Hot flashes. Not Present- Excessive Hunger, Hair Changes, Heat Intolerance and New Diabetes. Hematology Present- Blood Thinners and Easy Bruising. Not Present- Excessive bleeding, Gland problems, HIV and Persistent Infections.  Vitals (Candice Maldonado CMA; 09/24/2020 11:45 AM) 09/24/2020 11:45 AM Weight: 226.25 lb Height: 63in Body Surface Area: 2.04 m Body Mass Index: 40.08 kg/m  Temp.: 97.37F  Pulse: 101 (Regular)         Physical Exam Candice Key K. Narcissus Detwiler MD; 09/24/2020 9:04 PM) The physical exam findings are as follows: Note: Constitutional: WDWN in NAD, conversant, no obvious deformities; resting comfortably Eyes: Pupils equal, round; sclera anicteric; moist conjunctiva; no lid lag HENT: Oral mucosa moist; good dentition Neck: No masses palpated, trachea midline; no thyromegaly Lungs: CTA bilaterally; normal respiratory effort Breasts: right breast - mild fibrocystic changes; no dominant masses; chronic nipple retraction; no nipple discharge; no lymphadenopathy left breast - contracted lumpectomy scar with slight deformity; chronic nipple retraction; firmness in LUOQ spanning 2.5 cm. The patient states that this has been there a long time. Healed left axillary incision with no palpable lymphadenopathy CV: Regular rate and rhythm; no murmurs; extremities well-perfused with no edema Abd: +bowel sounds, obese, soft, non-tender, no palpable organomegaly; no palpable hernias Musc: Normal gait; no apparent clubbing or cyanosis in extremities Lymphatic: No palpable cervical or axillary lymphadenopathy Skin: Warm, dry; no sign of jaundice Psychiatric - alert and oriented x 4; calm mood and affect    Assessment & Plan Candice Key K. Aryaan Persichetti MD; 09/24/2020 9:09 PM) DUCTAL CARCINOMA IN SITU (DCIS) OF LEFT BREAST (D05.12) Current Plans  HISTORY OF LEFT BREAST CANCER (Z85.3) Impression: 2001 - [place], [place] - treated at [facility] with chemo/ radiation Lumpectomy/ ALND   Genetics negative MRI revealed possible right breast mass - biopsy was benign  Patient requests bilateral mastectomies.  No need for left SLNB since she had a dissection already.  Will proceed with left mastectomy/ right prophylactic mastectomy.  The surgical procedure has been discussed with the patient.  Potential risks, benefits, alternative treatments, and expected outcomes have been explained.  All of the patient's questions at  this time have been answered.  The likelihood of reaching the patient's treatment goal is good.  The patient understand the proposed surgical procedure and wishes to proceed.  Wilmon Arms. Corliss Skains, MD, [facility]  General/ Trauma Surgery   11/11/2020 9:57 AM

## 2020-11-15 ENCOUNTER — Telehealth: Payer: Self-pay | Admitting: Hematology and Oncology

## 2020-11-15 ENCOUNTER — Encounter: Payer: Self-pay | Admitting: *Deleted

## 2020-11-15 NOTE — Telephone Encounter (Signed)
Scheduled appt per 2/7 sch msg - unable to reach pt. Left message for patient with appt date and time   

## 2020-11-22 ENCOUNTER — Other Ambulatory Visit (HOSPITAL_COMMUNITY): Payer: 59

## 2020-11-22 ENCOUNTER — Other Ambulatory Visit (HOSPITAL_COMMUNITY)
Admission: RE | Admit: 2020-11-22 | Discharge: 2020-11-22 | Disposition: A | Discharge status: Home / Self Care | Payer: 59 | Source: Ambulatory Visit | Attending: Surgery | Admitting: Surgery

## 2020-11-22 ENCOUNTER — Encounter: Payer: Self-pay | Admitting: *Deleted

## 2020-11-22 DIAGNOSIS — Z20822 Contact with and (suspected) exposure to covid-19: Secondary | ICD-10-CM | POA: Diagnosis not present

## 2020-11-22 DIAGNOSIS — Z01812 Encounter for preprocedural laboratory examination: Secondary | ICD-10-CM | POA: Diagnosis present

## 2020-11-22 LAB — SARS CORONAVIRUS 2 (TAT 6-24 HRS): SARS Coronavirus 2: NEGATIVE

## 2020-11-24 ENCOUNTER — Encounter (HOSPITAL_COMMUNITY): Payer: Self-pay | Admitting: Surgery

## 2020-11-24 NOTE — Progress Notes (Signed)
Patient denies shortness of breath, fever, cough or chest pain.  PCP - Cipriano Mile, NP Cardiologist - n/a Oncology - Dr Lindi Adie  Chest x-ray - n/a EKG - DOS Stress Test - n/a ECHO - 07/21/20 Cardiac Cath - n/a  ERAS: Clear liquids til 4:30 am day of surgery  Fasting Blood Sugar - 80s Checks Blood Sugar 2 times a day  . If your blood sugar is less than 70 mg/dL, you will need to treat for low blood sugar: o Treat a low blood sugar (less than 70 mg/dL) with  cup of clear juice (cranberry or apple), 4 glucose tablets, OR glucose gel. o Recheck blood sugar in 15 minutes after treatment (to make sure it is greater than 70 mg/dL). If your blood sugar is not greater than 70 mg/dL on recheck, call [phone] for further instructions.  STOP now taking any Aspirin (unless otherwise instructed by your surgeon), Aleve, Naproxen, Ibuprofen, Motrin, Advil, Goody's, BC's, all herbal medications, fish oil, and all vitamins.   Coronavirus Screening Covid test on 11/22/20 was negative.  Patient verbalized understanding of instructions that were given via phone.

## 2020-11-24 NOTE — Anesthesia Preprocedure Evaluation (Signed)
Anesthesia Evaluation  Patient identified by MRN, date of birth, ID band Patient awake    Reviewed: Allergy & Precautions, NPO status , Patient's Chart, lab work & pertinent test results  History of Anesthesia Complications Negative for: history of anesthetic complications  Airway Mallampati: II  TM Distance: >3 FB Neck ROM: Full    Dental  (+) Edentulous Lower, Edentulous Upper   Pulmonary neg pulmonary ROS,    Pulmonary exam normal        Cardiovascular negative cardio ROS Normal cardiovascular exam     Neuro/Psych Depression negative neurological ROS     GI/Hepatic Neg liver ROS, GERD  Controlled and Medicated,  Endo/Other  diabetes, Type 2Morbid obesity  Renal/GU negative Renal ROS  negative genitourinary   Musculoskeletal  (+) Arthritis ,   Abdominal   Peds  Hematology negative hematology ROS (+)   Anesthesia Other Findings Breast ca  Reproductive/Obstetrics negative OB ROS                            Anesthesia Physical Anesthesia Plan  ASA: III  Anesthesia Plan: General   Post-op Pain Management: GA combined w/ Regional for post-op pain   Induction: Intravenous  PONV Risk Score and Plan: 3 and Treatment may vary due to age or medical condition, Midazolam, Ondansetron and Dexamethasone  Airway Management Planned: LMA  Additional Equipment: None  Intra-op Plan:   Post-operative Plan: Extubation in OR  Informed Consent: I have reviewed the patients History and Physical, chart, labs and discussed the procedure including the risks, benefits and alternatives for the proposed anesthesia with the patient or authorized representative who has indicated his/her understanding and acceptance.       Plan Discussed with: CRNA  Anesthesia Plan Comments:        Anesthesia Quick Evaluation

## 2020-11-25 ENCOUNTER — Inpatient Hospital Stay (HOSPITAL_COMMUNITY)
Admission: RE | Admit: 2020-11-25 | Discharge: 2020-11-27 | DRG: 582 | Disposition: A | Discharge status: Home / Self Care | Payer: 59 | Source: Ambulatory Visit | Attending: Surgery | Admitting: Surgery

## 2020-11-25 ENCOUNTER — Encounter (HOSPITAL_COMMUNITY): Payer: Self-pay | Admitting: Surgery

## 2020-11-25 ENCOUNTER — Ambulatory Visit (HOSPITAL_COMMUNITY): Payer: 59 | Admitting: Certified Registered"

## 2020-11-25 ENCOUNTER — Encounter (HOSPITAL_COMMUNITY)
Admission: RE | Disposition: A | Discharge status: Home / Self Care | Payer: Self-pay | Source: Ambulatory Visit | Attending: Surgery

## 2020-11-25 ENCOUNTER — Other Ambulatory Visit: Payer: Self-pay

## 2020-11-25 DIAGNOSIS — Z79899 Other long term (current) drug therapy: Secondary | ICD-10-CM

## 2020-11-25 DIAGNOSIS — Z8261 Family history of arthritis: Secondary | ICD-10-CM

## 2020-11-25 DIAGNOSIS — Z4001 Encounter for prophylactic removal of breast: Secondary | ICD-10-CM | POA: Diagnosis present

## 2020-11-25 DIAGNOSIS — Z923 Personal history of irradiation: Secondary | ICD-10-CM

## 2020-11-25 DIAGNOSIS — Z808 Family history of malignant neoplasm of other organs or systems: Secondary | ICD-10-CM

## 2020-11-25 DIAGNOSIS — Z820 Family history of epilepsy and other diseases of the nervous system: Secondary | ICD-10-CM

## 2020-11-25 DIAGNOSIS — E7800 Pure hypercholesterolemia, unspecified: Secondary | ICD-10-CM | POA: Diagnosis present

## 2020-11-25 DIAGNOSIS — L7632 Postprocedural hematoma of skin and subcutaneous tissue following other procedure: Secondary | ICD-10-CM | POA: Diagnosis not present

## 2020-11-25 DIAGNOSIS — Z833 Family history of diabetes mellitus: Secondary | ICD-10-CM

## 2020-11-25 DIAGNOSIS — Z832 Family history of diseases of the blood and blood-forming organs and certain disorders involving the immune mechanism: Secondary | ICD-10-CM

## 2020-11-25 DIAGNOSIS — K219 Gastro-esophageal reflux disease without esophagitis: Secondary | ICD-10-CM | POA: Diagnosis present

## 2020-11-25 DIAGNOSIS — Z841 Family history of disorders of kidney and ureter: Secondary | ICD-10-CM

## 2020-11-25 DIAGNOSIS — Z9221 Personal history of antineoplastic chemotherapy: Secondary | ICD-10-CM

## 2020-11-25 DIAGNOSIS — Z8049 Family history of malignant neoplasm of other genital organs: Secondary | ICD-10-CM

## 2020-11-25 DIAGNOSIS — Z803 Family history of malignant neoplasm of breast: Secondary | ICD-10-CM

## 2020-11-25 DIAGNOSIS — G6289 Other specified polyneuropathies: Secondary | ICD-10-CM | POA: Diagnosis present

## 2020-11-25 DIAGNOSIS — Z823 Family history of stroke: Secondary | ICD-10-CM

## 2020-11-25 DIAGNOSIS — D0512 Intraductal carcinoma in situ of left breast: Principal | ICD-10-CM | POA: Diagnosis present

## 2020-11-25 DIAGNOSIS — E119 Type 2 diabetes mellitus without complications: Secondary | ICD-10-CM | POA: Diagnosis present

## 2020-11-25 DIAGNOSIS — Z6841 Body Mass Index (BMI) 40.0 and over, adult: Secondary | ICD-10-CM

## 2020-11-25 DIAGNOSIS — Z8249 Family history of ischemic heart disease and other diseases of the circulatory system: Secondary | ICD-10-CM

## 2020-11-25 DIAGNOSIS — Y838 Other surgical procedures as the cause of abnormal reaction of the patient, or of later complication, without mention of misadventure at the time of the procedure: Secondary | ICD-10-CM | POA: Diagnosis not present

## 2020-11-25 DIAGNOSIS — Z853 Personal history of malignant neoplasm of breast: Secondary | ICD-10-CM

## 2020-11-25 HISTORY — DX: Gastro-esophageal reflux disease without esophagitis: K21.9

## 2020-11-25 HISTORY — DX: Myoneural disorder, unspecified: G70.9

## 2020-11-25 HISTORY — PX: TOTAL MASTECTOMY: SHX6129

## 2020-11-25 HISTORY — DX: Localized swelling, mass and lump, lower limb, bilateral: R22.43

## 2020-11-25 HISTORY — DX: Hyperlipidemia, unspecified: E78.5

## 2020-11-25 HISTORY — PX: OTHER SURGICAL HISTORY: SHX169

## 2020-11-25 HISTORY — DX: Anemia, unspecified: D64.9

## 2020-11-25 HISTORY — DX: Depression, unspecified: F32.A

## 2020-11-25 LAB — CBC
HCT: 34.7 % — ABNORMAL LOW (ref 36.0–46.0)
Hemoglobin: 10.5 g/dL — ABNORMAL LOW (ref 12.0–15.0)
MCH: 25.5 pg — ABNORMAL LOW (ref 26.0–34.0)
MCHC: 30.3 g/dL (ref 30.0–36.0)
MCV: 84.2 fL (ref 80.0–100.0)
Platelets: 233 10*3/uL (ref 150–400)
RBC: 4.12 MIL/uL (ref 3.87–5.11)
RDW: 15.3 % (ref 11.5–15.5)
WBC: 5.5 10*3/uL (ref 4.0–10.5)
nRBC: 0 % (ref 0.0–0.2)

## 2020-11-25 LAB — BASIC METABOLIC PANEL
Anion gap: 10 (ref 5–15)
BUN: 17 mg/dL (ref 8–23)
CO2: 24 mmol/L (ref 22–32)
Calcium: 8.7 mg/dL — ABNORMAL LOW (ref 8.9–10.3)
Chloride: 107 mmol/L (ref 98–111)
Creatinine, Ser: 0.81 mg/dL (ref 0.44–1.00)
GFR, Estimated: >60 mL/min (ref >60)
Glucose, Bld: 91 mg/dL (ref 70–99)
Potassium: 3.3 mmol/L — ABNORMAL LOW (ref 3.5–5.1)
Sodium: 141 mmol/L (ref 135–145)

## 2020-11-25 LAB — GLUCOSE, CAPILLARY
Glucose-Capillary: 96 mg/dL (ref 70–99)
Glucose-Capillary: 102 mg/dL — ABNORMAL HIGH (ref 70–99)

## 2020-11-25 SURGERY — MASTECTOMY, SIMPLE
Anesthesia: General | Site: Breast | Laterality: Bilateral

## 2020-11-25 MED ORDER — ATORVASTATIN CALCIUM 10 MG PO TABS
10.0000 mg | ORAL_TABLET | Freq: Every day | ORAL | Status: DC
  Administered 2020-11-26: 10 mg via ORAL
  Filled 2020-11-25: qty 1

## 2020-11-25 MED ORDER — METHOCARBAMOL 500 MG PO TABS
500.0000 mg | ORAL_TABLET | Freq: Four times a day (QID) | ORAL | Status: DC | PRN
  Administered 2020-11-25 – 2020-11-27 (×3): 500 mg via ORAL
  Filled 2020-11-25 (×3): qty 1

## 2020-11-25 MED ORDER — FENTANYL CITRATE (PF) 250 MCG/5ML IJ SOLN
INTRAMUSCULAR | Status: AC
  Filled 2020-11-25: qty 5

## 2020-11-25 MED ORDER — CHLORHEXIDINE GLUCONATE CLOTH 2 % EX PADS: 6.0000 | MEDICATED_PAD | Freq: Once | CUTANEOUS | Status: DC

## 2020-11-25 MED ORDER — DEXAMETHASONE SODIUM PHOSPHATE 10 MG/ML IJ SOLN
INTRAMUSCULAR | Status: DC | PRN
  Administered 2020-11-25: 4 mg via INTRAVENOUS

## 2020-11-25 MED ORDER — DIPHENHYDRAMINE HCL 50 MG/ML IJ SOLN: 12.5000 mg | Freq: Four times a day (QID) | INTRAMUSCULAR | Status: DC | PRN

## 2020-11-25 MED ORDER — FENTANYL CITRATE (PF) 250 MCG/5ML IJ SOLN
INTRAMUSCULAR | Status: DC | PRN
  Administered 2020-11-25: 25 ug via INTRAVENOUS
  Administered 2020-11-25: 50 ug via INTRAVENOUS
  Administered 2020-11-25 (×2): 25 ug via INTRAVENOUS

## 2020-11-25 MED ORDER — BUPIVACAINE LIPOSOME 1.3 % IJ SUSP
INTRAMUSCULAR | Status: DC | PRN
  Administered 2020-11-25 (×2): 10 mL via PERINEURAL

## 2020-11-25 MED ORDER — PANTOPRAZOLE SODIUM 40 MG PO TBEC
40.0000 mg | DELAYED_RELEASE_TABLET | Freq: Every day | ORAL | Status: DC
  Administered 2020-11-26 – 2020-11-27 (×2): 40 mg via ORAL
  Filled 2020-11-25 (×2): qty 1

## 2020-11-25 MED ORDER — MIDAZOLAM HCL 5 MG/5ML IJ SOLN
INTRAMUSCULAR | Status: DC | PRN
  Administered 2020-11-25: .5 mg via INTRAVENOUS

## 2020-11-25 MED ORDER — HYDROMORPHONE HCL 1 MG/ML IJ SOLN
1.0000 mg | INTRAMUSCULAR | Status: DC | PRN
  Administered 2020-11-26: 1 mg via INTRAVENOUS
  Filled 2020-11-25: qty 1

## 2020-11-25 MED ORDER — GABAPENTIN 100 MG PO CAPS: 200.0000 mg | ORAL_CAPSULE | Freq: Two times a day (BID) | ORAL | Status: DC

## 2020-11-25 MED ORDER — HYDROCHLOROTHIAZIDE 25 MG PO TABS
25.0000 mg | ORAL_TABLET | Freq: Every day | ORAL | Status: DC
  Administered 2020-11-26 – 2020-11-27 (×2): 25 mg via ORAL
  Filled 2020-11-25 (×2): qty 1

## 2020-11-25 MED ORDER — FENTANYL CITRATE (PF) 100 MCG/2ML IJ SOLN
INTRAMUSCULAR | Status: AC
  Administered 2020-11-25: 50 ug via INTRAVENOUS
  Filled 2020-11-25: qty 2

## 2020-11-25 MED ORDER — CHLORHEXIDINE GLUCONATE 0.12 % MT SOLN
15.0000 mL | Freq: Once | OROMUCOSAL | Status: AC
  Administered 2020-11-25: 15 mL via OROMUCOSAL
  Filled 2020-11-25: qty 15

## 2020-11-25 MED ORDER — EPHEDRINE SULFATE-NACL 50-0.9 MG/10ML-% IV SOSY
PREFILLED_SYRINGE | INTRAVENOUS | Status: DC | PRN
  Administered 2020-11-25: 10 mg via INTRAVENOUS
  Administered 2020-11-25: 5 mg via INTRAVENOUS
  Administered 2020-11-25: 10 mg via INTRAVENOUS

## 2020-11-25 MED ORDER — 0.9 % SODIUM CHLORIDE (POUR BTL) OPTIME
TOPICAL | Status: DC | PRN
  Administered 2020-11-25: 1000 mL

## 2020-11-25 MED ORDER — MIDAZOLAM HCL 2 MG/2ML IJ SOLN
INTRAMUSCULAR | Status: AC
  Filled 2020-11-25: qty 2

## 2020-11-25 MED ORDER — PROPOFOL 10 MG/ML IV BOLUS
INTRAVENOUS | Status: DC | PRN
  Administered 2020-11-25: 160 mg via INTRAVENOUS

## 2020-11-25 MED ORDER — ONDANSETRON HCL 4 MG/2ML IJ SOLN
INTRAMUSCULAR | Status: AC
  Filled 2020-11-25: qty 2

## 2020-11-25 MED ORDER — CEFAZOLIN SODIUM-DEXTROSE 2-4 GM/100ML-% IV SOLN
2.0000 g | INTRAVENOUS | Status: AC
  Administered 2020-11-25: 2 g via INTRAVENOUS
  Filled 2020-11-25: qty 100

## 2020-11-25 MED ORDER — DOCUSATE SODIUM 100 MG PO CAPS
100.0000 mg | ORAL_CAPSULE | Freq: Two times a day (BID) | ORAL | Status: DC
  Administered 2020-11-25 – 2020-11-27 (×4): 100 mg via ORAL
  Filled 2020-11-25 (×4): qty 1

## 2020-11-25 MED ORDER — DIPHENHYDRAMINE HCL 12.5 MG/5ML PO ELIX
12.5000 mg | ORAL_SOLUTION | Freq: Four times a day (QID) | ORAL | Status: DC | PRN
Start: 2020-11-25 — End: 2020-11-27

## 2020-11-25 MED ORDER — FENTANYL CITRATE (PF) 100 MCG/2ML IJ SOLN
25.0000 ug | INTRAMUSCULAR | Status: DC | PRN
  Administered 2020-11-25: 50 ug via INTRAVENOUS

## 2020-11-25 MED ORDER — PROPOFOL 10 MG/ML IV BOLUS
INTRAVENOUS | Status: AC
  Filled 2020-11-25: qty 20

## 2020-11-25 MED ORDER — OXYCODONE HCL 5 MG PO TABS
5.0000 mg | ORAL_TABLET | ORAL | Status: DC | PRN
  Administered 2020-11-25 – 2020-11-27 (×4): 10 mg via ORAL
  Filled 2020-11-25 (×4): qty 2

## 2020-11-25 MED ORDER — ACETAMINOPHEN 500 MG PO TABS
1000.0000 mg | ORAL_TABLET | ORAL | Status: AC
  Administered 2020-11-25: 1000 mg via ORAL
  Filled 2020-11-25: qty 2

## 2020-11-25 MED ORDER — LIDOCAINE 2% (20 MG/ML) 5 ML SYRINGE
INTRAMUSCULAR | Status: AC
  Filled 2020-11-25: qty 5

## 2020-11-25 MED ORDER — OXYCODONE HCL 5 MG PO TABS
5.0000 mg | ORAL_TABLET | Freq: Once | ORAL | Status: AC | PRN
  Administered 2020-11-25: 5 mg via ORAL

## 2020-11-25 MED ORDER — ONDANSETRON HCL 4 MG/2ML IJ SOLN
INTRAMUSCULAR | Status: DC | PRN
  Administered 2020-11-25: 4 mg via INTRAVENOUS

## 2020-11-25 MED ORDER — ORAL CARE MOUTH RINSE: 15.0000 mL | Freq: Once | OROMUCOSAL | Status: AC

## 2020-11-25 MED ORDER — ONDANSETRON 4 MG PO TBDP: 4.0000 mg | ORAL_TABLET | Freq: Four times a day (QID) | ORAL | Status: DC | PRN

## 2020-11-25 MED ORDER — LACTATED RINGERS IV SOLN: INTRAVENOUS | Status: DC | PRN

## 2020-11-25 MED ORDER — LIDOCAINE 2% (20 MG/ML) 5 ML SYRINGE
INTRAMUSCULAR | Status: DC | PRN
  Administered 2020-11-25: 100 mg via INTRAVENOUS

## 2020-11-25 MED ORDER — ENOXAPARIN SODIUM 40 MG/0.4ML ~~LOC~~ SOLN
40.0000 mg | SUBCUTANEOUS | Status: DC
  Filled 2020-11-25: qty 0.4

## 2020-11-25 MED ORDER — OXYCODONE HCL 5 MG PO TABS
ORAL_TABLET | ORAL | Status: AC
  Filled 2020-11-25: qty 1

## 2020-11-25 MED ORDER — BACLOFEN 10 MG PO TABS
10.0000 mg | ORAL_TABLET | Freq: Two times a day (BID) | ORAL | Status: DC
  Administered 2020-11-25 – 2020-11-27 (×4): 10 mg via ORAL
  Filled 2020-11-25 (×4): qty 1

## 2020-11-25 MED ORDER — OXYCODONE HCL 5 MG/5ML PO SOLN
5.0000 mg | Freq: Once | ORAL | Status: AC | PRN
Start: 2020-11-25 — End: 2020-11-25

## 2020-11-25 MED ORDER — BUPIVACAINE HCL (PF) 0.25 % IJ SOLN
INTRAMUSCULAR | Status: AC
  Filled 2020-11-25: qty 30

## 2020-11-25 MED ORDER — GABAPENTIN 300 MG PO CAPS
300.0000 mg | ORAL_CAPSULE | ORAL | Status: DC
  Filled 2020-11-25: qty 1

## 2020-11-25 MED ORDER — SODIUM CHLORIDE (PF) 0.9 % IJ SOLN
INTRAMUSCULAR | Status: AC
  Filled 2020-11-25: qty 10

## 2020-11-25 MED ORDER — ACETAMINOPHEN 500 MG PO TABS: 1000.0000 mg | ORAL_TABLET | Freq: Once | ORAL | Status: DC

## 2020-11-25 MED ORDER — METHYLENE BLUE 0.5 % INJ SOLN
INTRAVENOUS | Status: AC
  Filled 2020-11-25: qty 10

## 2020-11-25 MED ORDER — PROMETHAZINE HCL 25 MG/ML IJ SOLN: 6.2500 mg | INTRAMUSCULAR | Status: DC | PRN

## 2020-11-25 MED ORDER — SODIUM CHLORIDE 0.9 % IV SOLN: INTRAVENOUS | Status: DC

## 2020-11-25 MED ORDER — ACETAMINOPHEN 325 MG PO TABS
650.0000 mg | ORAL_TABLET | Freq: Four times a day (QID) | ORAL | Status: DC | PRN
  Administered 2020-11-26: 650 mg via ORAL
  Filled 2020-11-25: qty 2

## 2020-11-25 MED ORDER — ACETAMINOPHEN 650 MG RE SUPP: 650.0000 mg | Freq: Four times a day (QID) | RECTAL | Status: DC | PRN

## 2020-11-25 MED ORDER — ONDANSETRON HCL 4 MG/2ML IJ SOLN
4.0000 mg | Freq: Four times a day (QID) | INTRAMUSCULAR | Status: DC | PRN
  Administered 2020-11-26: 4 mg via INTRAVENOUS
  Filled 2020-11-25: qty 2

## 2020-11-25 MED ORDER — PREGABALIN 100 MG PO CAPS
200.0000 mg | ORAL_CAPSULE | Freq: Two times a day (BID) | ORAL | Status: DC
  Administered 2020-11-25 – 2020-11-27 (×4): 200 mg via ORAL
  Filled 2020-11-25 (×4): qty 2

## 2020-11-25 MED ORDER — BUPIVACAINE HCL (PF) 0.25 % IJ SOLN
INTRAMUSCULAR | Status: DC | PRN
  Administered 2020-11-25 (×2): 20 mL

## 2020-11-25 MED ORDER — LACTATED RINGERS IV SOLN: INTRAVENOUS | Status: DC

## 2020-11-25 MED ORDER — DEXAMETHASONE SODIUM PHOSPHATE 10 MG/ML IJ SOLN
INTRAMUSCULAR | Status: AC
  Filled 2020-11-25: qty 1

## 2020-11-25 SURGICAL SUPPLY — 39 items
APPLIER CLIP 9.375 MED OPEN (MISCELLANEOUS) ×2
BINDER BREAST XLRG (GAUZE/BANDAGES/DRESSINGS) ×2 IMPLANT
BIOPATCH RED 1 DISK 7.0 (GAUZE/BANDAGES/DRESSINGS) ×4 IMPLANT
CANISTER SUCT 3000ML PPV (MISCELLANEOUS) ×2 IMPLANT
CHLORAPREP W/TINT 26 (MISCELLANEOUS) ×2 IMPLANT
CLIP APPLIE 9.375 MED OPEN (MISCELLANEOUS) ×1 IMPLANT
COVER SURGICAL LIGHT HANDLE (MISCELLANEOUS) ×2 IMPLANT
COVER WAND RF STERILE (DRAPES) IMPLANT
DRAIN CHANNEL 19F RND (DRAIN) ×4 IMPLANT
DRAPE ORTHO SPLIT 77X108 STRL (DRAPES) ×2
DRAPE SURG ORHT 6 SPLT 77X108 (DRAPES) ×2 IMPLANT
DRSG PAD ABDOMINAL 8X10 ST (GAUZE/BANDAGES/DRESSINGS) ×4 IMPLANT
DRSG TEGADERM 4X4.75 (GAUZE/BANDAGES/DRESSINGS) ×4 IMPLANT
ELECT BLADE 4.0 EZ CLEAN MEGAD (MISCELLANEOUS) ×2
ELECT REM PT RETURN 9FT ADLT (ELECTROSURGICAL) ×2
ELECTRODE BLDE 4.0 EZ CLN MEGD (MISCELLANEOUS) ×1 IMPLANT
ELECTRODE REM PT RTRN 9FT ADLT (ELECTROSURGICAL) ×1 IMPLANT
EVACUATOR SILICONE 100CC (DRAIN) ×4 IMPLANT
GAUZE SPONGE 4X4 12PLY STRL (GAUZE/BANDAGES/DRESSINGS) ×4 IMPLANT
GAUZE SPONGE 4X4 12PLY STRL LF (GAUZE/BANDAGES/DRESSINGS) IMPLANT
GAUZE XEROFORM 1X8 LF (GAUZE/BANDAGES/DRESSINGS) ×4 IMPLANT
GLOVE BIO SURGEON STRL SZ7 (GLOVE) ×2 IMPLANT
GLOVE BIOGEL PI IND STRL 7.5 (GLOVE) ×1 IMPLANT
GLOVE BIOGEL PI INDICATOR 7.5 (GLOVE) ×1
GOWN STRL REUS W/ TWL LRG LVL3 (GOWN DISPOSABLE) ×3 IMPLANT
GOWN STRL REUS W/TWL LRG LVL3 (GOWN DISPOSABLE) ×3
KIT BASIN OR (CUSTOM PROCEDURE TRAY) ×2 IMPLANT
KIT TURNOVER KIT B (KITS) ×2 IMPLANT
NS IRRIG 1000ML POUR BTL (IV SOLUTION) ×2 IMPLANT
PACK GENERAL/GYN (CUSTOM PROCEDURE TRAY) ×2 IMPLANT
PAD ARMBOARD 7.5X6 YLW CONV (MISCELLANEOUS) ×2 IMPLANT
PENCIL SMOKE EVACUATOR (MISCELLANEOUS) ×2 IMPLANT
SPECIMEN JAR X LARGE (MISCELLANEOUS) ×4 IMPLANT
SPONGE LAP 18X18 RF (DISPOSABLE) ×2 IMPLANT
SUT ETHILON 3 0 FSL (SUTURE) ×4 IMPLANT
SUT SILK 2 0 SH (SUTURE) ×2 IMPLANT
SUT VIC AB 3-0 SH 18 (SUTURE) ×4 IMPLANT
TOWEL GREEN STERILE (TOWEL DISPOSABLE) ×2 IMPLANT
TOWEL GREEN STERILE FF (TOWEL DISPOSABLE) ×2 IMPLANT

## 2020-11-25 NOTE — Anesthesia Procedure Notes (Addendum)
Procedure Name: LMA Insertion Date/Time: 11/25/2020 7:49 AM Performed by: Janene Harvey, CRNA Pre-anesthesia Checklist: Patient identified, Emergency Drugs available, Suction available and Patient being monitored Patient Re-evaluated:Patient Re-evaluated prior to induction Oxygen Delivery Method: Circle system utilized Preoxygenation: Pre-oxygenation with 100% oxygen Induction Type: IV induction Ventilation: Mask ventilation without difficulty LMA: LMA inserted LMA Size: 4.0 Tube type: Oral Number of attempts: 1 Placement Confirmation: positive ETCO2 and breath sounds checked- equal and bilateral Tube secured with: Tape Dental Injury: Teeth and Oropharynx as per pre-operative assessment

## 2020-11-25 NOTE — Progress Notes (Signed)
Pt states she took 200mg  gabapentin this morning. Not listed on PTA meds.

## 2020-11-25 NOTE — Anesthesia Procedure Notes (Signed)
Anesthesia Regional Block: Pectoralis block   Pre-Anesthetic Checklist: ,, timeout performed, Correct Patient, Correct Site, Correct Laterality, Correct Procedure, Correct Position, site marked, Risks and benefits discussed, pre-op evaluation,  At surgeon's request and post-op pain management  Laterality: Right  Prep: Maximum Sterile Barrier Precautions used, chloraprep       Needles:  Injection technique: Single-shot  Needle Type: Echogenic Stimulator Needle     Needle Length: 9cm  Needle Gauge: 22     Additional Needles:   Procedures:,,,, ultrasound used (permanent image in chart),,,,  Narrative:  Start time: 11/25/2020 7:15 AM End time: 11/25/2020 7:18 AM Injection made incrementally with aspirations every 5 mL.  Performed by: Personally  Anesthesiologist: Brennan Bailey, MD  Additional Notes: Risks, benefits, and alternative discussed. Patient gave consent for procedure. Patient prepped and draped in sterile fashion. Sedation administered, patient remains easily responsive to voice. Relevant anatomy identified with ultrasound guidance. Local anesthetic given in 5cc increments with no signs or symptoms of intravascular injection. No pain or paraesthesias with injection. Patient monitored throughout procedure with signs of LAST or immediate complications. Tolerated well. Ultrasound image placed in chart.  Tawny Asal, MD

## 2020-11-25 NOTE — Interval H&P Note (Signed)
History and Physical Interval Note:  11/25/2020 7:13 AM  Candice Maldonado  has presented today for surgery, with the diagnosis of LEFT BREAST DUCTAL CARCINOMA IN SITE.  The various methods of treatment have been discussed with the patient and family. After consideration of risks, benefits and other options for treatment, the patient has consented to  Procedure(s) with comments: LEFT MASTECTOMY AND RIGHT PROPHYLACTIC MASTECTOMY (Bilateral) - LMA, PEC BLOCK; START TIME OF 7:30 AM FOR 120 MINUTES Allia Wiltsey IQ as a surgical intervention.  The patient's history has been reviewed, patient examined, no change in status, stable for surgery.  I have reviewed the patient's chart and labs.  Questions were answered to the patient's satisfaction.     Candice Maldonado

## 2020-11-25 NOTE — Transfer of Care (Signed)
Immediate Anesthesia Transfer of Care Note  Patient: Candice Maldonado  Procedure(s) Performed: LEFT MASTECTOMY AND RIGHT PROPHYLACTIC MASTECTOMY (Bilateral Breast)  Patient Location: PACU  Anesthesia Type:General and Regional  Level of Consciousness: drowsy and patient cooperative  Airway & Oxygen Therapy: Patient Spontanous Breathing  Post-op Assessment: Report given to RN and Post -op Vital signs reviewed and stable  Post vital signs: Reviewed and stable  Last Vitals:  Vitals Value Taken Time  BP 152/92 11/25/20 1005  Temp 36.7 C 11/25/20 1005  Pulse 77 11/25/20 1009  Resp 14 11/25/20 1009  SpO2 100 % 11/25/20 1009  Vitals shown include unvalidated device data.  Last Pain:  Vitals:   11/25/20 1005  TempSrc:   PainSc: 6          Complications: No complications documented.

## 2020-11-25 NOTE — Anesthesia Postprocedure Evaluation (Signed)
Anesthesia Post Note  Patient: Candice Maldonado  Procedure(s) Performed: LEFT MASTECTOMY AND RIGHT PROPHYLACTIC MASTECTOMY (Bilateral Breast)     Patient location during evaluation: PACU Anesthesia Type: General Level of consciousness: awake and alert and oriented Pain management: pain level controlled Vital Signs Assessment: post-procedure vital signs reviewed and stable Respiratory status: spontaneous breathing, nonlabored ventilation and respiratory function stable Cardiovascular status: blood pressure returned to baseline Postop Assessment: no apparent nausea or vomiting Anesthetic complications: no   No complications documented.  Last Vitals:  Vitals:   11/25/20 1100 11/25/20 1200  BP: (!) 144/81 138/78  Pulse: 72 74  Resp: 10 12  Temp: 36.8 C   SpO2: 100% 100%    Last Pain:  Vitals:   11/25/20 1200  TempSrc:   PainSc: [place]

## 2020-11-25 NOTE — Anesthesia Procedure Notes (Signed)
Anesthesia Regional Block: Pectoralis block   Pre-Anesthetic Checklist: ,, timeout performed, Correct Patient, Correct Site, Correct Laterality, Correct Procedure, Correct Position, site marked, Risks and benefits discussed, pre-op evaluation,  At surgeon's request and post-op pain management  Laterality: Left  Prep: Maximum Sterile Barrier Precautions used, chloraprep       Needles:  Injection technique: Single-shot  Needle Type: Echogenic Stimulator Needle     Needle Length: 9cm  Needle Gauge: 22     Additional Needles:   Procedures:,,,, ultrasound used (permanent image in chart),,,,  Narrative:  Start time: 11/25/2020 7:12 AM End time: 11/25/2020 7:14 AM Injection made incrementally with aspirations every 5 mL.  Performed by: Personally  Anesthesiologist: Brennan Bailey, MD  Additional Notes: Risks, benefits, and alternative discussed. Patient gave consent for procedure. Patient prepped and draped in sterile fashion. Sedation administered, patient remains easily responsive to voice. Relevant anatomy identified with ultrasound guidance. Local anesthetic given in 5cc increments with no signs or symptoms of intravascular injection. No pain or paraesthesias with injection. Patient monitored throughout procedure with signs of LAST or immediate complications. Tolerated well. Ultrasound image placed in chart.  Tawny Asal, MD

## 2020-11-25 NOTE — Op Note (Signed)
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 Gershon Mussel Cornett Anesthesia: General with bilateral pectoralis blocks Indications: This is a 65 year old female who has a history of left breast cancer status post lumpectomy and full left axillary lymph node dissection treated with chemotherapy and radiation.  Recently she was diagnosed with left breast DCIS.  She has undergone thorough work-up that shows no sign of disease in her right breast.  However due to her personal history of recurrent breast cancer she has opted for a prophylactic right mastectomy as well.  Description of procedure: The patient is brought to the operating room and placed in the supine position on the operating room table.  After an adequate level of general anesthesia was obtained, her entire chest was prepped with ChloraPrep and draped sterile fashion.  A timeout was taken to ensure the proper patient and proper procedure.  We outlined bilateral elliptical incisions to include both nipple areolar complexes.  We began on her right side.  I made the incision with a #10 blade.  Cautery was used to raise skin flaps up to the infraclavicular chest wall superiorly, the sternum medially, the inframammary crease inferiorly, and the latissimus muscle laterally.  Once we dissected our skin flaps, I dissected the breast tissue off of the underlying pectoralis muscle including the anterior fascia.  The specimen was removed and oriented with a suture lateral.  We inspected carefully for hemostasis.  We turned our attention to the left side.  We made similar incisions and dissected the skin flaps in similar fashion.  The breast tissue was excised off of the chest wall including the anterior fascia.  We inspected for hemostasis.  We irrigated the wound thoroughly.  A 19 French drain was brought in  through a stab incision laterally and was secured with a 3-0 nylon suture.  We reapproximated the skin with 3-0 Vicryl sutures.  Several sutures were used to it attached the skin flap down to the underlying chest wall.  Staples were used to close the skin.  We closed the right side in similar fashion after placing another drain.  Dressings were applied.  The patient was then extubated and brought to the recovery room in stable condition.  All sponge, instrument, and needle counts are correct.  Imogene Burn. Georgette Dover, MD, [facility] Surgery  General/ Trauma Surgery   11/25/2020 10:04 AM

## 2020-11-26 ENCOUNTER — Inpatient Hospital Stay (HOSPITAL_COMMUNITY): Payer: 59 | Admitting: Certified Registered Nurse Anesthetist

## 2020-11-26 ENCOUNTER — Encounter (HOSPITAL_COMMUNITY)
Admission: RE | Disposition: A | Discharge status: Home / Self Care | Payer: Self-pay | Source: Ambulatory Visit | Attending: Surgery

## 2020-11-26 ENCOUNTER — Encounter (HOSPITAL_COMMUNITY): Payer: Self-pay | Admitting: Surgery

## 2020-11-26 DIAGNOSIS — Z4001 Encounter for prophylactic removal of breast: Secondary | ICD-10-CM | POA: Diagnosis not present

## 2020-11-26 DIAGNOSIS — E7800 Pure hypercholesterolemia, unspecified: Secondary | ICD-10-CM | POA: Diagnosis not present

## 2020-11-26 DIAGNOSIS — Z853 Personal history of malignant neoplasm of breast: Secondary | ICD-10-CM | POA: Diagnosis not present

## 2020-11-26 DIAGNOSIS — Z923 Personal history of irradiation: Secondary | ICD-10-CM | POA: Diagnosis not present

## 2020-11-26 DIAGNOSIS — Z8249 Family history of ischemic heart disease and other diseases of the circulatory system: Secondary | ICD-10-CM | POA: Diagnosis not present

## 2020-11-26 DIAGNOSIS — Z833 Family history of diabetes mellitus: Secondary | ICD-10-CM | POA: Diagnosis not present

## 2020-11-26 DIAGNOSIS — Z832 Family history of diseases of the blood and blood-forming organs and certain disorders involving the immune mechanism: Secondary | ICD-10-CM | POA: Diagnosis not present

## 2020-11-26 DIAGNOSIS — L7632 Postprocedural hematoma of skin and subcutaneous tissue following other procedure: Secondary | ICD-10-CM | POA: Diagnosis not present

## 2020-11-26 DIAGNOSIS — Z820 Family history of epilepsy and other diseases of the nervous system: Secondary | ICD-10-CM | POA: Diagnosis not present

## 2020-11-26 DIAGNOSIS — Z79899 Other long term (current) drug therapy: Secondary | ICD-10-CM | POA: Diagnosis not present

## 2020-11-26 DIAGNOSIS — Z823 Family history of stroke: Secondary | ICD-10-CM | POA: Diagnosis not present

## 2020-11-26 DIAGNOSIS — Z841 Family history of disorders of kidney and ureter: Secondary | ICD-10-CM | POA: Diagnosis not present

## 2020-11-26 DIAGNOSIS — Z8261 Family history of arthritis: Secondary | ICD-10-CM | POA: Diagnosis not present

## 2020-11-26 DIAGNOSIS — Z8049 Family history of malignant neoplasm of other genital organs: Secondary | ICD-10-CM | POA: Diagnosis not present

## 2020-11-26 DIAGNOSIS — Z803 Family history of malignant neoplasm of breast: Secondary | ICD-10-CM | POA: Diagnosis not present

## 2020-11-26 DIAGNOSIS — Z9221 Personal history of antineoplastic chemotherapy: Secondary | ICD-10-CM | POA: Diagnosis not present

## 2020-11-26 DIAGNOSIS — Z808 Family history of malignant neoplasm of other organs or systems: Secondary | ICD-10-CM | POA: Diagnosis not present

## 2020-11-26 DIAGNOSIS — Z6841 Body Mass Index (BMI) 40.0 and over, adult: Secondary | ICD-10-CM | POA: Diagnosis not present

## 2020-11-26 DIAGNOSIS — Y838 Other surgical procedures as the cause of abnormal reaction of the patient, or of later complication, without mention of misadventure at the time of the procedure: Secondary | ICD-10-CM | POA: Diagnosis not present

## 2020-11-26 DIAGNOSIS — E119 Type 2 diabetes mellitus without complications: Secondary | ICD-10-CM | POA: Diagnosis not present

## 2020-11-26 DIAGNOSIS — G6289 Other specified polyneuropathies: Secondary | ICD-10-CM | POA: Diagnosis not present

## 2020-11-26 DIAGNOSIS — K219 Gastro-esophageal reflux disease without esophagitis: Secondary | ICD-10-CM | POA: Diagnosis not present

## 2020-11-26 DIAGNOSIS — D0512 Intraductal carcinoma in situ of left breast: Secondary | ICD-10-CM | POA: Diagnosis not present

## 2020-11-26 HISTORY — PX: EVACUATION BREAST HEMATOMA: SHX1537

## 2020-11-26 LAB — CBC
HCT: 25.5 % — ABNORMAL LOW (ref 36.0–46.0)
Hemoglobin: 8 g/dL — ABNORMAL LOW (ref 12.0–15.0)
MCH: 25.9 pg — ABNORMAL LOW (ref 26.0–34.0)
MCHC: 31.4 g/dL (ref 30.0–36.0)
MCV: 82.5 fL (ref 80.0–100.0)
Platelets: 226 10*3/uL (ref 150–400)
RBC: 3.09 MIL/uL — ABNORMAL LOW (ref 3.87–5.11)
RDW: 15.4 % (ref 11.5–15.5)
WBC: 8.8 10*3/uL (ref 4.0–10.5)
nRBC: 0 % (ref 0.0–0.2)

## 2020-11-26 LAB — ABO/RH: ABO/RH(D): O POS

## 2020-11-26 LAB — CBC WITH DIFFERENTIAL/PLATELET
Abs Immature Granulocytes: 0.02 10*3/uL (ref 0.00–0.07)
Basophils Absolute: 0 10*3/uL (ref 0.0–0.1)
Basophils Relative: 0 %
Eosinophils Absolute: 0 10*3/uL (ref 0.0–0.5)
Eosinophils Relative: 0 %
HCT: 25.7 % — ABNORMAL LOW (ref 36.0–46.0)
Hemoglobin: 7.8 g/dL — ABNORMAL LOW (ref 12.0–15.0)
Immature Granulocytes: 0 %
Lymphocytes Relative: 52 %
Lymphs Abs: 4.8 10*3/uL — ABNORMAL HIGH (ref 0.7–4.0)
MCH: 25.6 pg — ABNORMAL LOW (ref 26.0–34.0)
MCHC: 30.4 g/dL (ref 30.0–36.0)
MCV: 84.3 fL (ref 80.0–100.0)
Monocytes Absolute: 1 10*3/uL (ref 0.1–1.0)
Monocytes Relative: 11 %
Neutro Abs: 3.4 10*3/uL (ref 1.7–7.7)
Neutrophils Relative %: 37 %
Platelets: 232 10*3/uL (ref 150–400)
RBC: 3.05 MIL/uL — ABNORMAL LOW (ref 3.87–5.11)
RDW: 15.7 % — ABNORMAL HIGH (ref 11.5–15.5)
WBC: 9.4 10*3/uL (ref 4.0–10.5)
nRBC: 0 % (ref 0.0–0.2)

## 2020-11-26 LAB — PREPARE RBC (CROSSMATCH)

## 2020-11-26 SURGERY — EVACUATION, HEMATOMA, BREAST
Anesthesia: General | Site: Chest | Laterality: Bilateral

## 2020-11-26 MED ORDER — CEFAZOLIN SODIUM-DEXTROSE 2-4 GM/100ML-% IV SOLN
INTRAVENOUS | Status: AC
  Filled 2020-11-26: qty 100

## 2020-11-26 MED ORDER — EPHEDRINE SULFATE 50 MG/ML IJ SOLN
INTRAMUSCULAR | Status: DC | PRN
  Administered 2020-11-26: 10 mg via INTRAVENOUS

## 2020-11-26 MED ORDER — DEXAMETHASONE SODIUM PHOSPHATE 4 MG/ML IJ SOLN
INTRAMUSCULAR | Status: DC | PRN
  Administered 2020-11-26: 5 mg via INTRAVENOUS

## 2020-11-26 MED ORDER — LIDOCAINE HCL (CARDIAC) PF 100 MG/5ML IV SOSY
PREFILLED_SYRINGE | INTRAVENOUS | Status: DC | PRN
  Administered 2020-11-26: 60 mg via INTRAVENOUS

## 2020-11-26 MED ORDER — FENTANYL CITRATE (PF) 250 MCG/5ML IJ SOLN
INTRAMUSCULAR | Status: AC
  Filled 2020-11-26: qty 5

## 2020-11-26 MED ORDER — 0.9 % SODIUM CHLORIDE (POUR BTL) OPTIME
TOPICAL | Status: DC | PRN
  Administered 2020-11-26: 1000 mL

## 2020-11-26 MED ORDER — DEXAMETHASONE SODIUM PHOSPHATE 10 MG/ML IJ SOLN
INTRAMUSCULAR | Status: AC
  Filled 2020-11-26: qty 1

## 2020-11-26 MED ORDER — OXYCODONE HCL 5 MG PO TABS: 5.0000 mg | ORAL_TABLET | Freq: Four times a day (QID) | ORAL | 0 refills | Status: DC | PRN

## 2020-11-26 MED ORDER — PHENOL 1.4 % MT LIQD
1.0000 | OROMUCOSAL | Status: DC | PRN
  Administered 2020-11-26: 1 via OROMUCOSAL
  Filled 2020-11-26: qty 177

## 2020-11-26 MED ORDER — LACTATED RINGERS IV SOLN: INTRAVENOUS | Status: DC

## 2020-11-26 MED ORDER — MIDAZOLAM HCL 2 MG/2ML IJ SOLN
INTRAMUSCULAR | Status: AC
  Filled 2020-11-26: qty 2

## 2020-11-26 MED ORDER — SODIUM CHLORIDE 0.9% IV SOLUTION: Freq: Once | INTRAVENOUS | Status: AC

## 2020-11-26 MED ORDER — PROPOFOL 10 MG/ML IV BOLUS
INTRAVENOUS | Status: DC | PRN
  Administered 2020-11-26: 100 mg via INTRAVENOUS

## 2020-11-26 MED ORDER — PROPOFOL 10 MG/ML IV BOLUS
INTRAVENOUS | Status: AC
  Filled 2020-11-26: qty 20

## 2020-11-26 MED ORDER — CHLORHEXIDINE GLUCONATE 0.12 % MT SOLN
OROMUCOSAL | Status: AC
  Administered 2020-11-26: 15 mL via OROMUCOSAL
  Filled 2020-11-26: qty 15

## 2020-11-26 MED ORDER — ALBUMIN HUMAN 5 % IV SOLN: INTRAVENOUS | Status: DC | PRN

## 2020-11-26 MED ORDER — HEMOSTATIC AGENTS (NO CHARGE) OPTIME
TOPICAL | Status: DC | PRN
  Administered 2020-11-26 (×3): 1 via TOPICAL

## 2020-11-26 MED ORDER — ONDANSETRON HCL 4 MG/2ML IJ SOLN
INTRAMUSCULAR | Status: AC
  Filled 2020-11-26: qty 2

## 2020-11-26 MED ORDER — PHENYLEPHRINE HCL-NACL 10-0.9 MG/250ML-% IV SOLN
INTRAVENOUS | Status: DC | PRN
  Administered 2020-11-26: 20 ug/min via INTRAVENOUS

## 2020-11-26 MED ORDER — ACETAMINOPHEN 500 MG PO TABS
1000.0000 mg | ORAL_TABLET | Freq: Once | ORAL | Status: AC
  Administered 2020-11-26: 1000 mg via ORAL
  Filled 2020-11-26: qty 2

## 2020-11-26 MED ORDER — CHLORHEXIDINE GLUCONATE 0.12 % MT SOLN: 15.0000 mL | Freq: Once | OROMUCOSAL | Status: AC

## 2020-11-26 MED ORDER — PHENYLEPHRINE HCL (PRESSORS) 10 MG/ML IV SOLN
INTRAVENOUS | Status: AC
  Filled 2020-11-26: qty 1

## 2020-11-26 MED ORDER — LACTATED RINGERS IV SOLN: INTRAVENOUS | Status: DC | PRN

## 2020-11-26 SURGICAL SUPPLY — 45 items
BINDER BREAST XLRG (GAUZE/BANDAGES/DRESSINGS) ×2 IMPLANT
BLADE CLIPPER SURG (BLADE) IMPLANT
CANISTER SUCT 3000ML PPV (MISCELLANEOUS) ×2 IMPLANT
COVER SURGICAL LIGHT HANDLE (MISCELLANEOUS) ×4 IMPLANT
DECANTER SPIKE VIAL GLASS SM (MISCELLANEOUS) IMPLANT
DERMABOND ADVANCED (GAUZE/BANDAGES/DRESSINGS)
DERMABOND ADVANCED .7 DNX12 (GAUZE/BANDAGES/DRESSINGS) IMPLANT
DRAIN CHANNEL 19F RND (DRAIN) IMPLANT
DRAPE LAPAROTOMY T 102X78X121 (DRAPES) IMPLANT
DRSG PAD ABDOMINAL 8X10 ST (GAUZE/BANDAGES/DRESSINGS) ×8 IMPLANT
ELECT CAUTERY BLADE 6.4 (BLADE) ×2 IMPLANT
ELECT REM PT RETURN 9FT ADLT (ELECTROSURGICAL) ×2
ELECTRODE REM PT RTRN 9FT ADLT (ELECTROSURGICAL) ×1 IMPLANT
EVACUATOR SILICONE 100CC (DRAIN) IMPLANT
GAUZE SPONGE 4X4 12PLY STRL (GAUZE/BANDAGES/DRESSINGS) ×2 IMPLANT
GAUZE XEROFORM 1X8 LF (GAUZE/BANDAGES/DRESSINGS) ×4 IMPLANT
GLOVE BIO SURGEON STRL SZ7 (GLOVE) ×2 IMPLANT
GLOVE BIOGEL PI IND STRL 7.5 (GLOVE) ×1 IMPLANT
GLOVE BIOGEL PI INDICATOR 7.5 (GLOVE) ×1
GOWN STRL REUS W/ TWL LRG LVL3 (GOWN DISPOSABLE) ×2 IMPLANT
GOWN STRL REUS W/TWL LRG LVL3 (GOWN DISPOSABLE) ×2
HEMOSTAT ARISTA ABSORB 3G PWDR (HEMOSTASIS) ×6 IMPLANT
KIT BASIN OR (CUSTOM PROCEDURE TRAY) ×2 IMPLANT
KIT TURNOVER KIT B (KITS) ×2 IMPLANT
NEEDLE PRECISIONGLIDE 27X1.5 (NEEDLE) IMPLANT
NS IRRIG 1000ML POUR BTL (IV SOLUTION) ×2 IMPLANT
PACK GENERAL/GYN (CUSTOM PROCEDURE TRAY) ×2 IMPLANT
PAD ARMBOARD 7.5X6 YLW CONV (MISCELLANEOUS) ×4 IMPLANT
PENCIL SMOKE EVACUATOR (MISCELLANEOUS) ×2 IMPLANT
SEPRAFILM MEMBRANE 5X6 (MISCELLANEOUS) IMPLANT
SPONGE LAP 18X18 RF (DISPOSABLE) IMPLANT
STAPLER VISISTAT 35W (STAPLE) IMPLANT
STRIP CLOSURE SKIN 1/2X4 (GAUZE/BANDAGES/DRESSINGS) IMPLANT
SUT ETHILON 3 0 FSL (SUTURE) ×4 IMPLANT
SUT MNCRL AB 4-0 PS2 18 (SUTURE) IMPLANT
SUT SILK 2 0 (SUTURE)
SUT SILK 2-0 18XBRD TIE 12 (SUTURE) IMPLANT
SUT VIC AB 2-0 CT1 27 (SUTURE)
SUT VIC AB 2-0 CT1 TAPERPNT 27 (SUTURE) IMPLANT
SUT VIC AB 3-0 SH 18 (SUTURE) ×4 IMPLANT
SYR CONTROL 10ML LL (SYRINGE) IMPLANT
TOWEL GREEN STERILE (TOWEL DISPOSABLE) ×2 IMPLANT
TOWEL GREEN STERILE FF (TOWEL DISPOSABLE) ×2 IMPLANT
TRAY FOLEY MTR SLVR 14FR STAT (SET/KITS/TRAYS/PACK) IMPLANT
WATER STERILE IRR 1000ML POUR (IV SOLUTION) IMPLANT

## 2020-11-26 NOTE — Progress Notes (Signed)
Patient arrived to 6N15 from PACU. Report received from April, RN. Patient alert and oriented x4. Dressings clean, dry, and intact. Left and right JP drains assessed. Pain 0 out of 10. Daughter at bedside. Patient call bell within reach. Will continue to monitor.

## 2020-11-26 NOTE — Op Note (Signed)
Preop diagnosis: Anterior chest wall hematoma after mastectomy Postop diagnosis: Same Procedure performed: Evacuation of chest wall hematoma Surgeon:Tylor Courtwright K Amjad Fikes Anesthesia: General Indications: This is a 65 year old female who is 1 day postop from left mastectomy for recurrent breast cancer.  Today, she was noted to have hematoma under her right mastectomy flaps and increasing output from her drains.  Her Hgb decreased to 7.8.  She was given 1 unit of packed red blood cells and we proceeded back to the operating room.  Description of procedure: The patient was brought to the operating room placed in supine position on the operating room table.  After an adequate level of general anesthesia was obtained, her entire chest was prepped with Betadine and draped in sterile fashion.  A timeout was taken to ensure the proper patient and proper procedure.  The patient has obvious hematoma under the right mastectomy flap.  The left side appears flat with no obvious hematoma.  These 2 mastectomy sites do communicate across the midline which would explain the increased output from the left side.  We remove the staples on the right side the chest and remove the subcutaneous sutures.  We evacuated a moderate amount of clot from the chest wall.  We irrigated thoroughly to wash off any adherent clot.  I then carefully inspected the upper skin flap from medial to lateral.  There is no areas that are rapidly bleeding but there is some diffuse ooze.  We cauterized all of the visible bruising areas.  We repacked this with the lower flap.  Again there is no obvious pulsatile bleeding but there was diffuse ooze.  We then inspected the pectoralis muscle from medial to lateral.  Several areas were cauterized. Examined the most lateral portion of the wound past the edge of the pectoralis.  Several bleeding areas were cauterized again.  Once we are satisfied with hemostasis we sprayed Arista powder throughout the entire wound.  I  spent several minutes watching the entire field for any signs of bleeding.  As there was no sign of further bleeding, we closed the wound again with 3-0 Vicryl in the subcutaneous tissues and staples to close the skin.  The drains were emptied and then placed back to suction.  Patient was extubated brought to recovery room in stable condition.  All sponge, instrument, and needle counts are correct.  Imogene Burn. Georgette Dover, MD, [facility] Surgery  General/ Trauma Surgery   11/26/2020 7:21 PM

## 2020-11-26 NOTE — Discharge Summary (Signed)
Physician Discharge Summary  Patient ID: Candice Maldonado MRN: [id number] DOB/AGE: 1956-04-03 65 y.o.  Admit date: 11/25/2020 Discharge date: 11/27/2020  Admission Diagnoses:  Left breast DCIS    History of previous breast cancer  Discharge Diagnoses:  Active Problems:   Neoplasm of left breast, primary tumor staging category Tis: ductal carcinoma in situ (DCIS)   Discharged Condition: good  Hospital Course: on 2/17, she underwent left mastectomy and right prophylactic mastectomy.  She was kept overnight and was found to have a hematoma under her right mastectomy flaps. On POD #1, she was transfused 1 u PRBC for Hgb 7.8 and we returned to the operating room to evacuate the hematoma.  Diffuse oozing was noted but no obvious bleeding source.  No sign of bleeding on the left side.  At time of discharge hemoglobin was stable.  Pain control.  Felt was stable for discharge with close outpatient follow-up  Treatments: surgeries as listed above.  Discharge Exam: Blood pressure 130/70, pulse 75, temperature 98.1 F (36.7 C), temperature source Oral, resp. rate 15, height 5\' 3"  (1.6 m), weight 103 kg, SpO2 100 %.  General: Pt awake/alert/oriented x4 in  no major acute distress Eyes: PERRL, normal EOM. Sclera nonicteric Neuro: CN II-XII intact w/o focal sensory/motor deficits. Lymph: No head/neck/groin lymphadenopathy Psych:  No delerium/psychosis/paranoia HENT: Normocephalic, Mucus membranes moist.  No thrush Neck: Supple, No tracheal deviation  Mastectomy transverse incision closed with staples.  Minimal ecchymosis in the right upper flap but no major hematoma.  Drains with minimal serous output.  Patient comfortable.  No cellulitis. Chest: No pain.  Good respiratory excursion. CV:  Pulses intact.   Regular rhythm MS: Normal AROM mjr joints.  No obvious deformity Abdomen: Soft, Nondistended.   Nontender.  No incarcerated hernias. Ext:  SCDs BLE.  No significant edema.  No cyanosis Skin:  No petechiae / purpura   Disposition:      Allergies as of 11/27/2020      Reactions   Tape Itching, Rash   tegaderm  Ok with paper tape      Medication List    STOP taking these medications   gabapentin 100 MG capsule Commonly known as: NEURONTIN   HYDROcodone-acetaminophen 7.5-325 MG tablet Commonly known as: NORCO     TAKE these medications   ascorbic acid 500 MG tablet Commonly known as: VITAMIN C Take 500 mg by mouth daily.   atorvastatin 10 MG tablet Commonly known as: LIPITOR Take 10 mg by mouth daily.   baclofen 10 MG tablet Commonly known as: LIORESAL Take 10 mg by mouth 2 (two) times daily.   Black Cohosh 40 MG Caps Take 40 mg by mouth daily.   cholecalciferol 25 MCG (1000 UNIT) tablet Commonly known as: VITAMIN D3 Take 1,000 Units by mouth daily.   ELDERBERRY PO Take 1 capsule by mouth daily.   hydrochlorothiazide 25 MG tablet Commonly known as: HYDRODIURIL Take 25 mg by mouth daily.   meloxicam 7.5 MG tablet Commonly known as: Mobic Take 1 tablet (7.5 mg total) by mouth daily. Take in the morning, with food.   multivitamin tablet Take 1 tablet by mouth daily.   naproxen 500 MG tablet Commonly known as: NAPROSYN Take 500 mg by mouth every 12 (twelve) hours as needed for moderate pain.   omeprazole 20 MG capsule Commonly known as: PRILOSEC Take 20 mg by mouth daily.   oxyCODONE 5 MG immediate release tablet Commonly known as: Oxy IR/ROXICODONE Take 1-2 tablets (5-10 mg total) by mouth every  6 (six) hours as needed for moderate pain.   pregabalin 200 MG capsule Commonly known as: LYRICA Take 200 mg by mouth 2 (two) times daily.   pyridOXINE 100 MG tablet Commonly known as: VITAMIN B-6 Take 100 mg by mouth daily.   tiZANidine 4 MG tablet Commonly known as: Zanaflex Take 1 tablet (4 mg total) by mouth every 6 (six) hours as needed for muscle spasms.   vitamin B-12 1000 MCG tablet Commonly known as: CYANOCOBALAMIN Take 1,000 mcg  by mouth daily.            Discharge Care Instructions  (From admission, onward)         Start     Ordered   11/27/20 0000  Discharge wound care:       Comments: It is good for closed incisions and even open wounds to be washed every day.  Shower every day.  Short baths are fine.  Wash the incisions and wounds clean with soap & water.    You may leave closed incisions open to air if it is dry.   You may cover the incision with clean gauze & replace it after your daily shower for comfort.  DRAIN:  You have a drain in place.  Every day change the dressing in the shower, wash around the skin exit site with soap & water and place a new dressing of gauze or band aid around the skin every day.  Keep the drain site clean & dry.  Follow up in our surgery office to discuss plans for drain removal in the near future  STAPLES: You have skin staples.  Leave them in place & set up an appointment for them to be removed by a surgery office nurse ~10 days after surgery.   11/27/20 1040          Follow-up Information     Donnie Mesa, MD Follow up in 1 week(s).   Specialty: General Surgery Contact information: [address] [place] [address] [phone]                 Signed: Maia Petties 11/26/2020, 7:23 AM

## 2020-11-26 NOTE — Progress Notes (Signed)
1 Day Post-Op   Subjective/Chief Complaint: Quite sore - requiring steady pain medication PO Hgb down to 8.0 - not tachycardic, normotensive Moderate amount of drainage - mostly from the right Tolerating regular diet   Objective: Vital signs in last 24 hours: Temp:  [98 F (36.7 C)-98.8 F (37.1 C)] 98.1 F (36.7 C) (02/18 0438) Pulse Rate:  [68-86] 75 (02/18 0438) Resp:  [10-17] 15 (02/18 0438) BP: (111-157)/(70-92) 130/70 (02/18 0438) SpO2:  [99 %-100 %] 100 % (02/18 0438)    Intake/Output from previous day: 02/17 0701 - 02/18 0700 In: 1500 [I.V.:1500] Out: 680 [Urine:250; Drains:380; Blood:50] Intake/Output this shift: Total I/O In: 220 [P.O.:220] Out: -   General appearance: alert, cooperative and no distress Skin flaps viable Some swelling under the right mastectomy flap, but no ecchymosis Left chest wall is flat Drainage - thin serosanguinous    Lab Results:  Recent Labs    11/25/20 0607 11/26/20 0032  WBC 5.5 8.8  HGB 10.5* 8.0*  HCT 34.7* 25.5*  PLT 233 226   BMET Recent Labs    11/25/20 0607  NA 141  K 3.3*  CL 107  CO2 24  GLUCOSE 91  BUN 17  CREATININE 0.81  CALCIUM 8.7*   PT/INR No results for input(s): LABPROT, INR in the last 72 hours. ABG No results for input(s): PHART, HCO3 in the last 72 hours.  Invalid input(s): PCO2, PO2  Studies/Results: No results found.  Anti-infectives: Anti-infectives (From admission, onward)   Start     Dose/Rate Route Frequency Ordered Stop   11/25/20 0600  ceFAZolin (ANCEF) IVPB 2g/100 mL premix        2 g 200 mL/hr over 30 Minutes Intravenous On call to O.R. 11/25/20 0550 11/25/20 0750      Assessment/Plan: Left breast DCIS/ history of invasive ductal carcinoma left breast S/p left lumpectomy/ ALND Left mastectomy/ right prophylactic mastectomy 11/25/20.  Post-op pain control Some hematoma on right side  Hold anticoagulation Ice packs/ compression with breast binder Repeat CBC in AM -  possible discharge if stable Will follow-up next week for wound check/ drain removal.  LOS: 0 days    Candice Maldonado 11/26/2020

## 2020-11-26 NOTE — Anesthesia Procedure Notes (Signed)
Procedure Name: LMA Insertion Date/Time: 11/26/2020 6:13 PM Performed by: Oletta Lamas, CRNA Pre-anesthesia Checklist: Patient identified, Emergency Drugs available, Suction available and Patient being monitored Patient Re-evaluated:Patient Re-evaluated prior to induction Oxygen Delivery Method: Circle System Utilized Preoxygenation: Pre-oxygenation with 100% oxygen Induction Type: IV induction Ventilation: Mask ventilation without difficulty LMA: LMA inserted LMA Size: 4.0 Number of attempts: 1 Placement Confirmation: positive ETCO2 Tube secured with: Tape Dental Injury: Teeth and Oropharynx as per pre-operative assessment

## 2020-11-26 NOTE — Progress Notes (Signed)
Patient ID: Candice Maldonado, female   DOB: 21-Jul-1956, 65 y.o.   MRN: [id number]  Called by nurse - patient with syncopal episode Will recheck Hgb NPO  Will return to the OR this evening to wash out the hematoma and look for possible sources of bleeding.  Imogene Burn. Georgette Dover, MD, [facility] Surgery  General/ Trauma Surgery   11/26/2020 10:41 AM

## 2020-11-26 NOTE — Transfer of Care (Addendum)
Immediate Anesthesia Transfer of Care Note  Patient: Candice Maldonado  Procedure(s) Performed: EVACUATION POST-OP CHEST WALL HEMATOMA (Bilateral Chest)  Patient Location: PACU  Anesthesia Type:General  Level of Consciousness: awake, alert , oriented and patient cooperative  Airway & Oxygen Therapy: Patient Spontanous Breathing  Post-op Assessment: Report given to RN and Post -op Vital signs reviewed and stable  Post vital signs: Reviewed and stable  Last Vitals:  Vitals Value Taken Time  BP 131/68   Temp    Pulse 74 11/26/20 1925  Resp 14 11/26/20 1925  SpO2 100 % 11/26/20 1925  Vitals shown include unvalidated device data.  Last Pain:  Vitals:   11/26/20 1731  TempSrc: Oral  PainSc:       Patients Stated Pain Goal: 3 (59/45/85 9292)  Complications: No complications documented.

## 2020-11-26 NOTE — Anesthesia Postprocedure Evaluation (Signed)
Anesthesia Post Note  Patient: Candice Maldonado  Procedure(s) Performed: EVACUATION POST-OP CHEST WALL HEMATOMA (Bilateral Chest)     Patient location during evaluation: PACU Anesthesia Type: General Level of consciousness: awake and alert Pain management: pain level controlled Vital Signs Assessment: post-procedure vital signs reviewed and stable Respiratory status: spontaneous breathing, nonlabored ventilation and respiratory function stable Cardiovascular status: blood pressure returned to baseline and stable Postop Assessment: no apparent nausea or vomiting Anesthetic complications: no   No complications documented.  Last Vitals:  Vitals:   11/26/20 1941 11/26/20 2008  BP: 117/61 124/75  Pulse: 73 74  Resp: 14 15  Temp: 36.9 C 36.9 C  SpO2: 100% 100%    Last Pain:  Vitals:   11/26/20 2017  TempSrc:   PainSc: 0-No pain                 Chyann Ambrocio,W. EDMOND

## 2020-11-26 NOTE — Anesthesia Preprocedure Evaluation (Addendum)
Anesthesia Evaluation  Patient identified by MRN, date of birth, ID band Patient awake    Reviewed: Allergy & Precautions, NPO status , Patient's Chart, lab work & pertinent test results  History of Anesthesia Complications Negative for: history of anesthetic complications  Airway Mallampati: I   Neck ROM: Full    Dental  (+) Edentulous Lower, Edentulous Upper   Pulmonary neg pulmonary ROS,    Pulmonary exam normal        Cardiovascular negative cardio ROS Normal cardiovascular exam     Neuro/Psych Depression negative neurological ROS     GI/Hepatic Neg liver ROS, GERD  Controlled and Medicated,  Endo/Other  diabetes, Type 2Morbid obesity  Renal/GU negative Renal ROS  negative genitourinary   Musculoskeletal  (+) Arthritis ,   Abdominal   Peds  Hematology negative hematology ROS (+)   Anesthesia Other Findings Breast ca  Reproductive/Obstetrics negative OB ROS                            Anesthesia Physical  Anesthesia Plan  ASA: III  Anesthesia Plan: General   Post-op Pain Management: GA combined w/ Regional for post-op pain   Induction: Intravenous  PONV Risk Score and Plan: 4 or greater and Treatment may vary due to age or medical condition, Midazolam, Ondansetron and Dexamethasone  Airway Management Planned: LMA  Additional Equipment: None  Intra-op Plan:   Post-operative Plan:   Informed Consent: I have reviewed the patients History and Physical, chart, labs and discussed the procedure including the risks, benefits and alternatives for the proposed anesthesia with the patient or authorized representative who has indicated his/her understanding and acceptance.       Plan Discussed with: Anesthesiologist and Surgeon  Anesthesia Plan Comments:        Anesthesia Quick Evaluation

## 2020-11-27 ENCOUNTER — Encounter (HOSPITAL_COMMUNITY): Payer: Self-pay | Admitting: Surgery

## 2020-11-27 LAB — TYPE AND SCREEN
ABO/RH(D): O POS
Antibody Screen: NEGATIVE
Unit division: 0

## 2020-11-27 LAB — BPAM RBC
Blood Product Expiration Date: 202203182359
ISSUE DATE / TIME: 202202181425
Unit Type and Rh: 5100

## 2020-11-27 LAB — CBC
HCT: 25 % — ABNORMAL LOW (ref 36.0–46.0)
Hemoglobin: 8.4 g/dL — ABNORMAL LOW (ref 12.0–15.0)
MCH: 27.7 pg (ref 26.0–34.0)
MCHC: 33.6 g/dL (ref 30.0–36.0)
MCV: 82.5 fL (ref 80.0–100.0)
Platelets: 175 10*3/uL (ref 150–400)
RBC: 3.03 MIL/uL — ABNORMAL LOW (ref 3.87–5.11)
RDW: 15.9 % — ABNORMAL HIGH (ref 11.5–15.5)
WBC: 6.4 10*3/uL (ref 4.0–10.5)
nRBC: 0 % (ref 0.0–0.2)

## 2020-11-27 NOTE — Discharge Instructions (Signed)
[facility] [place] °[phone] ° °MASTECTOMY: POST OP INSTRUCTIONS ° °Always review your discharge instruction sheet given to you by the facility where your surgery was performed. °IF YOU HAVE DISABILITY OR FAMILY LEAVE FORMS, YOU MUST BRING THEM TO THE OFFICE FOR PROCESSING.   °DO NOT GIVE THEM TO YOUR DOCTOR. °A prescription for pain medication may be given to you upon discharge.  Take your pain medication as prescribed, if needed.  If narcotic pain medicine is not needed, then you may take acetaminophen (Tylenol) or ibuprofen (Advil) as needed. °1. Take your usually prescribed medications unless otherwise directed. °2. If you need a refill on your pain medication, please contact your pharmacy.  They will contact our office to request authorization.  Prescriptions will not be filled after 5pm or on week-ends. °3. You should follow a light diet the first few days after arrival home, such as soup and crackers, etc.  Resume your normal diet the day after surgery. °4. Most patients will experience some swelling and bruising on the chest and underarm.  Ice packs will help.  Swelling and bruising can take several days to resolve.  °5. It is common to experience some constipation if taking pain medication after surgery.  Increasing fluid intake and taking a stool softener (such as Colace) will usually help or prevent this problem from occurring.  A mild laxative (Milk of Magnesia or Miralax) should be taken according to package instructions if there are no bowel movements after 48 hours. °6. Unless discharge instructions indicate otherwise, leave your bandage dry and in place until your next appointment in 3-5 days.  You may take a limited sponge bath.  No tube baths or showers until the drains are removed.  You may have steri-strips (small skin tapes) in place directly over the incision.  These strips should be left on the skin for 7-10 days.  If your surgeon used skin glue on the incision, you may  shower in 24 hours.  The glue will flake off over the next 2-3 weeks.  Any sutures or staples will be removed at the office during your follow-up visit. °7. DRAINS:  If you have drains in place, it is important to keep a list of the amount of drainage produced each day in your drains.  Before leaving the hospital, you should be instructed on drain care.  Call our office if you have any questions about your drains. °8. ACTIVITIES:  You may resume regular (light) daily activities beginning the next day--such as daily self-care, walking, climbing stairs--gradually increasing activities as tolerated.  You may have sexual intercourse when it is comfortable.  Refrain from any heavy lifting or straining until approved by your doctor. °a. You may drive when you are no longer taking prescription pain medication, you can comfortably wear a seatbelt, and you can safely maneuver your car and apply brakes. °b. RETURN TO WORK:  __________________________________________________________ °9. You should see your doctor in the office for a follow-up appointment approximately 3-5 days after your surgery.  Your doctor’s nurse will typically make your follow-up appointment when she calls you with your pathology report.  Expect your pathology report 2-3 business days after your surgery.  You may call to check if you do not hear from us after three days.   °10. OTHER INSTRUCTIONS: ______________________________________________________________________________________________ ____________________________________________________________________________________________ ° °WHEN TO CALL YOUR DOCTOR: °1. Fever over 101.0 °2. Nausea and/or vomiting °3. Extreme swelling or bruising °4. Continued bleeding from incision. °5. Increased pain, redness, or drainage from the   The clinic staff is available to answer your questions during regular business hours.  Please don't hesitate to call and ask to speak to one of the nurses for clinical  concerns.  If you have a medical emergency, go to the nearest emergency room or call 911.  A surgeon from [facility] is always on call at the hospital. [address] [place], [place], [place]  [address] ? [address], [place], [place]   [address] [phone] ? [phone] ? FAX [phone] Web site: [email or web address]   DRAIN CARE:   You have a closed bulb drain to help you heal.    A bulb drain is a small, plastic reservoir which creates a gentle suction. It is used to remove excess fluid from a surgical wound. The color and amount of fluid will vary. Immediately after surgery, the fluid is bright red. It may gradually change to a yellow color. When the amount decreases to about 1 or 2 tablespoons (15 to 30 cc) per 24 hours, your caregiver will usually remove it.  JP Care  The Jackson-Pratt drainage system has flexible tubing attached to a soft, plastic bulb with a stopper. The drainage end of the tubing, which is flat and white, goes into your body through a small opening near your incision (surgical cut). A stitch holds the drainage end in place. The rest of the tube is outside your body, attached to the bulb. When the bulb is compressed with the stopper in place, it creates a vacuum. This causes a constant gentle suction, which helps draw out fluid that collects under your incision. The bulb should be compressed at all times, except when you are emptying the drainage.  How long you will have your Jackson-Pratt depends on your surgery and the amount of fluid is draining. This is different for everyone. The Jackson-Pratt is usually removed when the drainage is 30 mL or less over 24 hours. To keep track of how much drainage you're having, you will record the amount in a drainage log. It's important to bring the log with you to your follow-up appointments.  Caring for Your Jackson-Pratt at Home In order to care for your Jackson-Pratt at home, you or your caregiver will do the  following:  Empty the drain once a day and record the color and amount of drainage  Care for the area where the tubing enters your skin by washing with soap and water.  Milk the tubing to help move clots into the bulb.  Do this before you empty and measure your drainage. Look in the mirror at the tubing. This will help you see where your hands need to be. Pinch the tubing close to where it goes into your skin between your thumb and forefinger. With the thumb and forefinger of your other hand, pinch the tubing right below your other fingers. Keep your fingers pinched and slide them down the tubing, pushing any clots down toward the bulb. You may want to use alcohol swabs to help you slide your fingers down the tubing. Repeat steps 3 and 4 as necessary to push clots from the tubing into the bulb. If you are not able to move a clot into the bulb, call your doctor's office. The fluid may leak around the insertion site if a clot is blocking the drainage flow. If there is fluid in the bulb and no leakage at the insertion site, the drain is working.  How to Empty Your Jackson-Pratt and Record the Drainage  You will need to empty your Jackson-Pratt every day  Gather the following supplies:  Measuring container your nurse gave you Jackson-Pratt Drainage Record  Pen or pencil  Instructions Clean an area to work on. Clean your hands thoroughly. Unplug the stopper on top of your Jackson-Pratt. This will cause the bulb to expand. Do not touch the inside of the stopper or the inner area of the opening on the bulb. Turn your Jackson-Pratt upside down, gently squeeze the bulb, and pour the drainage into the measuring container. Turn your Jackson-Pratt right side up. Squeeze the bulb until your fingers feel the palm of your hand. Keep squeezing the bulb while you replug the stopper. Make sure the bulb stays fully compressed to ensure constant, gentle suction.    Check the amount and color of drainage  in the measuring container. The first couple days after surgery the fluid may be dark red. This is normal. As you heal the fluid may look pink or pale yellow. Record this amount and the color of drainage on your Jackson-Pratt Drainage Record. Flush the drainage down the toilet and rinse the measuring container with water.  Caring for the Insertion Site  Once you have emptied the drainage, clean your hands again. Check the area around the insertion site. Look for tenderness, swelling, or pus. If you have any of these, or if you have a temperature of 101 F (38.3 C) or higher, you may have an infection. Call your doctor's office.  Sometimes, the drain causes redness the size of a dime at your insertion site. This is normal. Your healthcare provider will tell you if you should place a bandage over the insertion site.  Wash drain site with soap & water (dilute hydrogen peroxide PRN) daily & replace clean dressing / tape    DAILY CARE  Keep the bulb compressed at all times, except while emptying it. The compression creates suction.   Keep sites where the tubes enter the skin dry and covered with a light bandage (dressing).   Tape the tubes to your skin, 1 to 2 inches below the insertion sites, to keep from pulling on your stitches. Tubes are stitched in place and will not slip out.   Pin the bulb to your shirt (not to your pants) with a safety pin.   For the first few days after surgery, there usually is more fluid in the bulb. Empty the bulb whenever it becomes half full because the bulb does not create enough suction if it is too full. Include this amount in your 24 hour totals.   When the amount of drainage decreases, empty the bulb at the same time every day. Write down the amounts and the 24 hour totals. Your caregiver will want to know them. This helps your caregiver know when the tubes can be removed.   (We anticipate removing the drain in 1-3 weeks, depending on when the output is  <25mL a day for 2+ days)  If there is drainage around the tube sites, change dressings and keep the area dry. If you see a clot in the tube, leave it alone. However, if the tube does not appear to be draining, let your caregiver know.  TO EMPTY THE BULB  Open the stopper to release suction.   Holding the stopper out of the way, pour drainage into the measuring cup that was sent home with you.   Measure and write down the amount. If there are 2 bulbs, note the amount of drainage  from bulb 1 or bulb 2 and keep the totals separate. Your caregiver will want to know which tube is draining more.   Compress the bulb by folding it in half.   Replace the stopper.   Check the tape that holds the tube to your skin, and pin the bulb to your shirt.  SEEK MEDICAL CARE IF:  The drainage develops a bad odor.   You have an oral temperature above 102 F (38.9 C).   The amount of drainage from your wound suddenly increases or decreases.   You accidentally pull out your drain.   You have any other questions or concerns.  MAKE SURE YOU:   Understand these instructions.   Will watch your condition.   Will get help right away if you are not doing well or get worse.     Call our office if you have any questions about your drain. [phone]

## 2020-11-27 NOTE — Progress Notes (Signed)
Discharge home. Home discharge instruction given to daughter, no question verbalized.

## 2020-11-27 NOTE — Plan of Care (Signed)
  Problem: Education: Goal: Knowledge of General Education information will improve Description: Including pain rating scale, medication(s)/side effects and non-pharmacologic comfort measures Outcome: Completed/Met   Problem: Education: Goal: Knowledge of General Education information will improve Description: Including pain rating scale, medication(s)/side effects and non-pharmacologic comfort measures Outcome: Completed/Met   Problem: Health Behavior/Discharge Planning: Goal: Ability to manage health-related needs will improve Outcome: Completed/Met   Problem: Clinical Measurements: Goal: Ability to maintain clinical measurements within normal limits will improve Outcome: Completed/Met Goal: Will remain free from infection Outcome: Completed/Met Goal: Diagnostic test results will improve Outcome: Completed/Met Goal: Respiratory complications will improve Outcome: Completed/Met Goal: Cardiovascular complication will be avoided Outcome: Completed/Met   Problem: Activity: Goal: Risk for activity intolerance will decrease Outcome: Completed/Met   Problem: Nutrition: Goal: Adequate nutrition will be maintained Outcome: Completed/Met   Problem: Coping: Goal: Level of anxiety will decrease Outcome: Completed/Met   Problem: Elimination: Goal: Will not experience complications related to bowel motility Outcome: Completed/Met Goal: Will not experience complications related to urinary retention Outcome: Completed/Met   Problem: Pain Managment: Goal: General experience of comfort will improve Outcome: Completed/Met   Problem: Safety: Goal: Ability to remain free from injury will improve Outcome: Completed/Met   Problem: Skin Integrity: Goal: Risk for impaired skin integrity will decrease Outcome: Completed/Met

## 2020-11-29 LAB — SURGICAL PATHOLOGY

## 2020-11-30 ENCOUNTER — Encounter: Payer: Self-pay | Admitting: *Deleted

## 2020-12-02 NOTE — Assessment & Plan Note (Signed)
Screening mammogram on 07/20/20 showed left breast calcifications. Diagnostic mammogram on 08/27/20 showed indeterminate left breast calcifications, 1.2cm, upper outer quadrant. 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  She does not know what type it is but she did not receive antiestrogen therapy.  Therefore it is possible that she had triple negative disease originally)  11/25/20:Bilateral Mastectomies:  Right: No cancer 0/6 LN, Left: DCIS

## 2020-12-02 NOTE — Progress Notes (Signed)
Patient Care Team: Sonia Side., FNP as PCP - General (Family Medicine) Rockwell Germany, RN as Oncology Nurse Navigator Mauro Kaufmann, RN as Oncology Nurse Navigator  DIAGNOSIS:    ICD-10-CM   1. Neoplasm of left breast, primary tumor staging category Tis: ductal carcinoma in situ (DCIS)  D05.12     SUMMARY OF ONCOLOGIC HISTORY: Oncology History  Ductal carcinoma in situ (DCIS) of left breast  2000 Miscellaneous   history of left breast cancer in 2000 for which she underewnt a lumpectomy, chemotherapy and radiation   09/09/2020 Initial Diagnosis   Screening mammogram on 07/20/20 showed left breast calcifications. Diagnostic mammogram on 08/27/20 showed indeterminate left breast calcifications, 1.2cm, upper outer quadrant. 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   11/25/2020 Surgery   Bilateral mastectomies ([place]):  Right breast: ductal hyperplasia, and no carcinoma in 6 lymph nodes Left breast: intermediate grade DCIS, 0.3cm, clear margins.      CHIEF COMPLIANT: Follow-up s/p bilateral mastectomies   INTERVAL HISTORY: Candice Maldonado is a 65 y.o. with above-mentioned history of left breast DCIS. She underwent bilateral mastectomies on 11/25/20 with Dr. Georgette Dover for which pathology showed in the right breast, ductal hyperplasia, and no carcinoma in 6 lymph nodes, and in the left breast, intermediate grade DCIS, 0.3cm, clear margins. She presents to the clinic today to discuss the pathology report and further treatment.  She is experiencing moderate pain and discomfort from the recent bilateral mastectomies.  ALLERGIES:   is allergic to tape.  MEDICATIONS:  Current Outpatient Medications  Medication Sig Dispense Refill   ascorbic acid (VITAMIN C) 500 MG tablet Take 500 mg by mouth daily.     atorvastatin (LIPITOR) 10 MG tablet Take 10 mg by mouth daily.     baclofen (LIORESAL) 10 MG tablet Take 10 mg by mouth 2 (two) times daily.     Black Cohosh 40 MG CAPS Take 40 mg by mouth daily.     cholecalciferol (VITAMIN D3) 25 MCG (1000 UNIT) tablet Take 1,000 Units by mouth daily.     ELDERBERRY PO Take 1 capsule by mouth daily.     hydrochlorothiazide (HYDRODIURIL) 25 MG tablet Take 25 mg by mouth daily.     meloxicam (MOBIC) 7.5 MG tablet Take 1 tablet (7.5 mg total) by mouth daily. Take in the morning, with food. (Patient not taking: No sig reported) 10 tablet 0   Multiple Vitamin (MULTIVITAMIN) tablet Take 1 tablet by mouth daily.     naproxen (NAPROSYN) 500 MG tablet Take 500 mg by mouth every 12 (twelve) hours as needed for moderate pain.     omeprazole (PRILOSEC) 20 MG capsule Take 20 mg by mouth daily.     oxyCODONE (OXY IR/ROXICODONE) 5 MG immediate release tablet Take 1-2 tablets (5-10 mg total) by mouth every 6 (six) hours as needed for moderate pain. 30 tablet 0   pregabalin (LYRICA) 200 MG capsule Take 200 mg by mouth 2 (two) times daily.     pyridOXINE (VITAMIN B-6) 100 MG tablet Take 100 mg by mouth daily. (Patient not taking: No sig reported)  tiZANidine (ZANAFLEX) 4 MG tablet Take 1 tablet (4 mg total) by mouth every 6 (six) hours as needed for muscle spasms. (Patient not taking: No sig reported) 30 tablet 0   vitamin B-12 (CYANOCOBALAMIN) 1000 MCG tablet Take 1,000 mcg by mouth daily.     No current facility-administered medications for this visit.    PHYSICAL EXAMINATION: ECOG PERFORMANCE STATUS: 1 - Symptomatic but completely ambulatory  Vitals:   12/03/20 1214  BP: (!) 94/56  Pulse: 73  Temp: 98.1 F (36.7 C)  SpO2: 99%   Filed Weights   12/03/20 1214  Weight: 218  lb 1.6 oz (98.9 kg)    LABORATORY DATA:  I have reviewed the data as listed CMP Latest Ref Rng & Units 11/25/2020 12/04/2019  Glucose 70 - 99 mg/dL 91 -  BUN 8 - 23 mg/dL 17 12  Creatinine 0.44 - 1.00 mg/dL 0.81 0.74  Sodium 135 - 145 mmol/L 141 -  Potassium 3.5 - 5.1 mmol/L 3.3(L) -  Chloride 98 - 111 mmol/L 107 -  CO2 22 - 32 mmol/L 24 -  Calcium 8.9 - 10.3 mg/dL 8.7(L) -    Lab Results  Component Value Date   WBC 6.4 11/27/2020   HGB 8.4 (L) 11/27/2020   HCT 25.0 (L) 11/27/2020   MCV 82.5 11/27/2020   PLT 175 11/27/2020   NEUTROABS 3.4 11/26/2020    ASSESSMENT & PLAN:  Neoplasm of left breast, primary tumor staging category Tis: ductal carcinoma in situ (DCIS) Screening mammogram on 07/20/20 showed left breast calcifications. Diagnostic mammogram on 08/27/20 showed indeterminate left breast calcifications, 1.2cm, upper outer quadrant. 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  She does not know what type it is but she did not receive antiestrogen therapy.  Therefore it is possible that she had triple negative disease originally)  11/25/20:Bilateral Mastectomies:  Right: No cancer 0/6 LN, Left: DCIS  Since she had bilateral mastectomies, there is no role of radiation or antiestrogen therapy. Return to clinic on an as-needed basis.   No orders of the defined types were placed in this encounter.  The patient has a good understanding of the overall plan. she agrees with it. she will call with any problems that may develop before the next visit here.  Total time spent: 20 mins including face to face time and time spent for planning, charting and coordination of care  Rulon Eisenmenger, MD, MPH 12/03/2020  I, Molly Dorshimer, am acting as scribe for Dr. Nicholas Lose.  I have reviewed the above documentation for  accuracy and completeness, and I agree with the above.

## 2020-12-03 ENCOUNTER — Inpatient Hospital Stay: Payer: 59 | Attending: Hematology and Oncology | Admitting: Hematology and Oncology

## 2020-12-03 ENCOUNTER — Other Ambulatory Visit: Payer: Self-pay

## 2020-12-03 DIAGNOSIS — Z923 Personal history of irradiation: Secondary | ICD-10-CM | POA: Insufficient documentation

## 2020-12-03 DIAGNOSIS — Z803 Family history of malignant neoplasm of breast: Secondary | ICD-10-CM | POA: Diagnosis not present

## 2020-12-03 DIAGNOSIS — Z853 Personal history of malignant neoplasm of breast: Secondary | ICD-10-CM | POA: Insufficient documentation

## 2020-12-03 DIAGNOSIS — D0512 Intraductal carcinoma in situ of left breast: Secondary | ICD-10-CM | POA: Insufficient documentation

## 2020-12-03 DIAGNOSIS — Z86010 Personal history of colonic polyps: Secondary | ICD-10-CM | POA: Insufficient documentation

## 2020-12-03 DIAGNOSIS — Z9013 Acquired absence of bilateral breasts and nipples: Secondary | ICD-10-CM | POA: Diagnosis not present

## 2020-12-03 DIAGNOSIS — Z9221 Personal history of antineoplastic chemotherapy: Secondary | ICD-10-CM | POA: Insufficient documentation

## 2020-12-06 ENCOUNTER — Encounter: Payer: Self-pay | Admitting: *Deleted

## 2020-12-31 ENCOUNTER — Other Ambulatory Visit (HOSPITAL_COMMUNITY): Payer: Self-pay | Admitting: Surgery

## 2020-12-31 DIAGNOSIS — M79621 Pain in right upper arm: Secondary | ICD-10-CM

## 2021-01-04 ENCOUNTER — Ambulatory Visit (HOSPITAL_COMMUNITY)
Admission: RE | Admit: 2021-01-04 | Discharge: 2021-01-04 | Disposition: A | Discharge status: Home / Self Care | Payer: 59 | Source: Ambulatory Visit | Attending: Surgery | Admitting: Surgery

## 2021-01-04 ENCOUNTER — Other Ambulatory Visit: Payer: Self-pay

## 2021-01-04 DIAGNOSIS — M79621 Pain in right upper arm: Secondary | ICD-10-CM | POA: Diagnosis present

## 2021-01-10 ENCOUNTER — Ambulatory Visit: Payer: 59

## 2021-01-18 ENCOUNTER — Ambulatory Visit: Payer: 59

## 2021-01-31 ENCOUNTER — Ambulatory Visit: Payer: 59 | Attending: Surgery

## 2021-01-31 ENCOUNTER — Other Ambulatory Visit: Payer: Self-pay

## 2021-01-31 DIAGNOSIS — M25611 Stiffness of right shoulder, not elsewhere classified: Secondary | ICD-10-CM | POA: Diagnosis present

## 2021-01-31 DIAGNOSIS — D0592 Unspecified type of carcinoma in situ of left breast: Secondary | ICD-10-CM | POA: Diagnosis present

## 2021-01-31 DIAGNOSIS — M25612 Stiffness of left shoulder, not elsewhere classified: Secondary | ICD-10-CM | POA: Insufficient documentation

## 2021-01-31 DIAGNOSIS — R600 Localized edema: Secondary | ICD-10-CM | POA: Insufficient documentation

## 2021-01-31 DIAGNOSIS — Z483 Aftercare following surgery for neoplasm: Secondary | ICD-10-CM | POA: Diagnosis present

## 2021-01-31 DIAGNOSIS — R293 Abnormal posture: Secondary | ICD-10-CM | POA: Insufficient documentation

## 2021-01-31 NOTE — Therapy (Signed)
[place], [place], [address] Phone: [phone]   Fax:  [phone]  Physical Therapy Treatment  Patient Details  Name: Candice Maldonado MRN: [id number] Date of Birth: 07/10/1956 Referring Provider (PT): Dr. Georgette Dover   Encounter Date: 01/31/2021   PT End of Session - 01/31/21 1135    Visit Number 1    Number of Visits 12    Date for PT Re-Evaluation 03/14/21    PT Start Time 0905    PT Stop Time 0955    PT Time Calculation (min) 50 min    Activity Tolerance Patient tolerated treatment well    Behavior During Therapy [facility] for tasks assessed/performed           Past Medical History:  Diagnosis Date  . Anemia    had blood transfusion  . Arthritis   . Breast cancer ([place]) 2000  . Colon polyps 2015  . Depression   . Diabetes ([place])    type 2  - diet controlled   . Ductal carcinoma in situ of breast   . Family history of breast cancer   . Gallstones   . GERD (gastroesophageal reflux disease)   . HLD (hyperlipidemia)   . Localized swelling of both lower legs    on HCTZ  . 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 Obesity   . Personal history of chemotherapy   . Personal history of malignant neoplasm of breast   . Personal history of radiation therapy     Past Surgical History:  Procedure Laterality Date  . BACK SURGERY  2011  . BREAST LUMPECTOMY    . CHOLECYSTECTOMY  1980  . EVACUATION BREAST HEMATOMA Bilateral 11/26/2020   Procedure: EVACUATION POST-OP CHEST WALL HEMATOMA;  Surgeon: Donnie Mesa, MD;  Location: [place];  Service: General;  Laterality: Bilateral;  . LEFT MASTECTOMY AND RIGHT PROPHYLACTIC MASTECTOMY (Bilateral Breast)  11/25/2020  . TOTAL MASTECTOMY Bilateral 11/25/2020   Procedure: LEFT MASTECTOMY AND RIGHT PROPHYLACTIC MASTECTOMY;  Surgeon: Donnie Mesa, MD;  Location: [place];  Service: General;  Laterality: Bilateral;  LMA, PEC BLOCK; START TIME OF  7:30 AM FOR 120 MINUTES TSUEI IQ  . TUBAL LIGATION  1990    There were no vitals filed for this visit.   Subjective Assessment - 01/31/21 0911    Subjective Pt had a left mastectomy on 11/25/20 and a right prophylactic mastectomy.Pt is having  a lot of tightness and pain in the right arm since her surgery.    Her  right arm started swelling last week mainly in the hand.  She tried taking a fluid pill and it helped a little.  Pain travels all the way down to the wrist and also into the right UT. chest region feels very tight also bilaterally. Left shoulder is also limited but not quite as much   Pertinent History Pt has a history of prior left breast CA with lumpectomy and extensive ALND of she believes 11 LN in 2000. She had radiation and chemotherapy at that time.  She had recent bilateral mastectomies with right being prophylactic.  Surgery note does not note right LN removal, however [name] note says 0/6 LN removed on right.  On 11/26/2020 pt had to have another surgery for evacuation of chest wall hematoma.  Her drains were removed on December 10, 2020.  Pt is disabled due to back pain.    How long can you stand comfortably? not  long secondary to back pain    How long can you walk comfortably? not long secondary to back pain    Patient Stated Goals Decrease right arm pain/chest tightness    Currently in Pain? Yes    Pain Score 6     Pain Location Axilla    Pain Orientation Right    Pain Descriptors / Indicators Tightness;Sharp    Pain Type Surgical pain    Pain Onset More than a month ago    Pain Frequency Intermittent    Aggravating Factors  reaching with the right arm, doing hair, sleeping    Pain Relieving Factors pain meds for back help some    Effect of Pain on Daily Activities limits ability to wash hair, reach, perform household chores, dress properly    Multiple Pain Sites Yes    Pain Score 8    Pain Location Chest    Pain Orientation Right;Left    Pain Descriptors / Indicators  Tightness    Pain Type Surgical pain    Pain Onset More than a month ago    Pain Frequency Constant              [facility] PT Assessment - 01/31/21 0001      Assessment   Medical Diagnosis Bilateral mastectomy    Referring Provider (PT) Dr. Georgette Dover    Onset Date/Surgical Date 11/25/20    Hand Dominance Right    Prior Therapy back      Precautions   Precaution Comments Lymphedema risk      Restrictions   Weight Bearing Restrictions No      Balance Screen   Has the patient fallen in the past 6 months Yes    How many times? 1    Has the patient had a decrease in activity level because of a fear of falling?  No    Is the patient reluctant to leave their home because of a fear of falling?  No      Home Environment   Living Environment Private residence    Living Arrangements Children    Available Help at Discharge Family    Type of [place]      Prior Function   Level of Independence Independent    Vocation On disability    Leisure reading, fishing, watch TV      Cognition   Overall Cognitive Status Within Functional Limits for tasks assessed      Observation/Other Assessments   Observations firmness noted right chest below incision and left chest above incision.    Skin Integrity incisions well healed      Posture/Postural Control   Posture/Postural Control Postural limitations    Postural Limitations Rounded Shoulders;Forward head      AROM   Right Shoulder Extension 40 Degrees    Right Shoulder Flexion 107 Degrees    Right Shoulder ABduction 75 Degrees    Left Shoulder Extension 56 Degrees    Left Shoulder Flexion 136 Degrees    Left Shoulder ABduction 110 Degrees    Left Shoulder Internal Rotation 40 Degrees    Left Shoulder External Rotation 71 Degrees             LYMPHEDEMA/ONCOLOGY QUESTIONNAIRE - 01/31/21 0001      Surgeries   Mastectomy Date 11/25/20      Treatment   Active Chemotherapy Treatment No    Past Chemotherapy Treatment Yes     Active Radiation Treatment No    Past Radiation Treatment Yes  Current Hormone Treatment No    Past Hormone Therapy No      What other symptoms do you have   Are you Having Heaviness or Tightness Yes    Are you having Pain Yes    Is it Hard or Difficult finding clothes that fit No    Do you have infections No      Right Upper Extremity Lymphedema   15 cm Proximal to Olecranon Process 35.3 cm    10 cm Proximal to Olecranon Process 35.4 cm    Olecranon Process 27.9 cm    15 cm Proximal to Ulnar Styloid Process 25 cm    10 cm Proximal to Ulnar Styloid Process 21.5 cm    Just Proximal to Ulnar Styloid Process 17.8 cm    Across Hand at [employer] 21.1 cm    At [place] of 2nd Digit 7 cm      Left Upper Extremity Lymphedema   15 cm Proximal to Olecranon Process 36 cm    10 cm Proximal to Olecranon Process 35.8 cm    Olecranon Process 27.3 cm    15 cm Proximal to Ulnar Styloid Process 24.3 cm    10 cm Proximal to Ulnar Styloid Process 20.3 cm    Just Proximal to Ulnar Styloid Process 16.5 cm    Across Hand at [employer] 20.3 cm    At [place] of 2nd Digit 6.4 cm              Quick Dash - 01/31/21 0001    Open a tight or new jar Severe difficulty    Do heavy household chores (wash walls, wash floors) Moderate difficulty    Carry a shopping bag or briefcase Severe difficulty    Wash your back Severe difficulty    Use a knife to cut food Moderate difficulty    Recreational activities in which you take some force or impact through your arm, shoulder, or hand (golf, hammering, tennis) Severe difficulty    During the past week, to what extent has your arm, shoulder or hand problem interfered with your normal social activities with family, friends, neighbors, or groups? Quite a bit    During the past week, to what extent has your arm, shoulder or hand problem limited your work or other regular daily activities Quite a bit    Arm, shoulder, or hand pain. Severe    Tingling (pins  and needles) in your arm, shoulder, or hand Mild    Difficulty Sleeping Severe difficulty    DASH Score 65.91 %                          PT Education - 01/31/21 1132    Education Details Pt was educated in 4 post op exercises.  She was set up for ABC class on 02/10/2021, and will be measured for sleeves on $/29/2022    Person(s) Educated Patient    Methods Explanation;Handout;Demonstration    Comprehension Verbalized understanding;Returned demonstration   will need review              PT Long Term Goals - 01/31/21 1149      PT LONG TERM GOAL #1   Title pt will be independent and compliant with HEP to improve ROM and strength of bilateral shoulders    Time 4    Period Weeks    Status New    Target Date 02/28/21      PT  LONG TERM GOAL #2   Title pt will have bilateral shoulder ROM WFL for normal performance of home activities    Time 6    Period Weeks    Status New    Target Date 03/14/21      PT LONG TERM GOAL #3   Title Pt will have decreased right arm and chest pain by greater than 50%    Time 6    Period Weeks    Status New    Target Date 03/14/21      PT LONG TERM GOAL #4   Title Pt will be fit for compression sleeve to for precautionary regions and can don/doff independently    Time 4    Period Weeks    Status New    Target Date 02/28/21      PT LONG TERM GOAL #5   Title pt will have improvement in quick dash by atleast 20 points to demonstrate improved function    Baseline 65%    Time 6    Period Weeks    Status New    Target Date 03/14/21                 Plan - 01/31/21 1136    Clinical Impression Statement pt is s/p left mastectomy and right prophylactic mastectomy on 11/25/2020.  She had another surgery the following day to evacuate a chest hematoma.  She reports significant right shoulder/axillary pain and has Right UE cords that radiate down the arm to the wrist/hand.  Shoulder ROM is significantly limited on the right greater  than left.  Mastectomy incisions are healing well, but there is noticeable fibrosis on the right distal to the incision and on the left proximal to her incision.  She feels tightness in her chest bilaterally as would be expected.  She does have swelling noted in the right hand and wrist and it is questionable if she had LN's removed on the right as surgery note does not indicate that, but [name] note does.    Personal Factors and Comorbidities Comorbidity 3+    Comorbidities reoccurence of left breast CA, prior radiation, prior chemo, on disability for back pain    Examination-Activity Limitations Bathing;Carry;Dressing;Lift;Stand;Sleep;Reach Overhead    Stability/Clinical Decision Making Stable/Uncomplicated    Clinical Decision Making Low    Rehab Potential Good    PT Frequency 2x / week    PT Duration 6 weeks    PT Treatment/Interventions Therapeutic exercise;Patient/family education;Manual techniques;Passive range of motion;Scar mobilization;Compression bandaging;Manual lymph drainage    PT Next Visit Plan Review HEP and progress to supine wand as able, chest stretch, MFR to cording on the right, and bilateral chest regions, see if she got measured for sleeve (Apr. 29), PROM, chip packs prn, monitor hand/wrist swelling.    PT Home Exercise Plan 4 post op exercises;to try supine flex and stargaazer    Recommended Other Services prophylactic sleeve. (Determine if LN's removed on Right)    Consulted and Agree with Plan of Care Patient           Patient will benefit from skilled therapeutic intervention in order to improve the following deficits and impairments:  Decreased knowledge of precautions,Decreased range of motion,Decreased scar mobility,Decreased strength,Increased edema,Increased fascial restricitons,Impaired sensation,Postural dysfunction,Impaired UE functional use,Pain  Visit Diagnosis: Carcinoma in situ of left breast, unspecified type  Aftercare following surgery for  neoplasm  Abnormal posture  Localized edema  Stiffness of right shoulder, not elsewhere classified  Stiffness of left shoulder,  not elsewhere classified     Problem List Patient Active Problem List   Diagnosis Date Noted  . Neoplasm of left breast, primary tumor staging category Tis: ductal carcinoma in situ (DCIS) 11/25/2020  . Genetic testing 10/14/2020  . Family history of breast cancer   . Personal history of malignant neoplasm of breast   . Ductal carcinoma in situ of breast   . 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    Claris Pong 01/31/2021, 11:54 AM  [place], [place], [address] Phone: [phone]   Fax:  [phone]  Name: Candice Maldonado MRN: [id number] Date of Birth: 02-01-1956 Cheral Almas, PT 01/31/21 11:57 AM

## 2021-01-31 NOTE — Patient Instructions (Signed)
Patient was instructed today in a home exercise program today for post op shoulder range of motion. These included active assist shoulder flexion in sitting, scapular retraction, wall walking with shoulder abduction, and hands behind head external rotation.  She was advised she may try AA flexion and stargazer in supine. She was encouraged to do these twice a day, holding 3 seconds and repeating 5 times Physical Therapy Information for After Breast Cancer Surgery/Treatment:   Lymphedema is a swelling condition that you may be at risk for in your arm if you have lymph nodes removed from the armpit area.  After a sentinel node biopsy, the risk is approximately 5-9% and is higher after an axillary node dissection.  There is treatment available for this condition and it is not life-threatening.  Contact your physician or physical therapist with concerns.  You may begin the 4 shoulder/posture exercises (see additional sheet) when permitted by your physician (typically a week after surgery).  If you have drains, you may need to wait until those are removed before beginning range of motion exercises.  A general recommendation is to not lift your arms above shoulder height until drains are removed.  These exercises should be done to your tolerance and gently.  This is not a "no pain/no gain" type of recovery so listen to your body and stretch into the range of motion that you can tolerate, stopping if you have pain.  If you are having immediate reconstruction, ask your plastic surgeon about doing exercises as he or she may want you to wait.  We encourage you to attend the free one time ABC (After Breast Cancer) class offered by [facility].  You will learn information related to lymphedema risk, prevention and treatment and additional exercises to regain mobility following surgery.  You can call [phone] for more information.  This is offered the 1st and 3rd Monday of each month.  You  only attend the class one time.  While undergoing any medical procedure or treatment, try to avoid blood pressure being taken or needle sticks from occurring on the arm on the side of cancer.   This recommendation begins after surgery and continues for the rest of your life.  This may help reduce your risk of getting lymphedema (swelling in your arm).  An excellent resource for those seeking information on lymphedema is the National Lymphedema Network's web site. It can be accessed at [facility].org  If you notice swelling in your hand, arm or breast at any time following surgery (even if it is many years from now), please contact your doctor or physical therapist to discuss this.  Lymphedema can be treated at any time but it is easier for you if it is treated early on.  If you feel like your shoulder motion is not returning to normal in a reasonable amount of time, please contact your surgeon or physical therapist.  [facility]. [place], [place], [place] [phone]; [address] [place], [place] [address] ABC CLASS After Breast Cancer Class  After Breast Cancer Class is a specially designed exercise class to assist you in a safe recover after having breast cancer surgery.  In this class you will learn how to get back to full function whether your drains were just removed or if you had surgery a month ago.  This one-time class is held the 1st and 3rd Monday of every month from 11:00 a.m. until 12:00 noon at the [facility] located at [facility]  [place], [place] [address]  Class Goals   Understand specific stretches to improve the flexibility of you chest and shoulder.  Learn ways to safely strengthen your upper body and improve your posture.  Understand the warning signs of infection and why you may be at risk for an arm infection.  Learn about Lymphedema and prevention  Pt was set up for ABC class on 02/07/2021  Pt was also scheduled to come in Friday at 9:00  to be measured for compression sleeves.

## 2021-02-08 ENCOUNTER — Other Ambulatory Visit: Payer: Self-pay

## 2021-02-08 ENCOUNTER — Ambulatory Visit: Payer: 59 | Attending: Surgery

## 2021-02-08 DIAGNOSIS — M25611 Stiffness of right shoulder, not elsewhere classified: Secondary | ICD-10-CM | POA: Insufficient documentation

## 2021-02-08 DIAGNOSIS — R600 Localized edema: Secondary | ICD-10-CM | POA: Insufficient documentation

## 2021-02-08 DIAGNOSIS — M25612 Stiffness of left shoulder, not elsewhere classified: Secondary | ICD-10-CM | POA: Insufficient documentation

## 2021-02-08 DIAGNOSIS — R293 Abnormal posture: Secondary | ICD-10-CM | POA: Diagnosis present

## 2021-02-08 DIAGNOSIS — D0592 Unspecified type of carcinoma in situ of left breast: Secondary | ICD-10-CM | POA: Diagnosis present

## 2021-02-08 DIAGNOSIS — Z483 Aftercare following surgery for neoplasm: Secondary | ICD-10-CM | POA: Insufficient documentation

## 2021-02-08 NOTE — Therapy (Signed)
[place], [place], [address] Phone: [phone]   Fax:  [phone]  Physical Therapy Treatment  Patient Details  Name: Candice Maldonado MRN: [id number] Date of Birth: [birth year] Referring Provider (PT): Dr. Georgette Dover   Encounter Date: 02/08/2021   PT End of Session - 02/08/21 1544    Visit Number 2    Number of Visits 12    Date for PT Re-Evaluation 03/14/21    PT Start Time 1500    PT Stop Time 1555    PT Time Calculation (min) 55 min    Activity Tolerance Patient tolerated treatment well    Behavior During Therapy [facility] for tasks assessed/performed           Past Medical History:  Diagnosis Date  . Anemia    had blood transfusion  . Arthritis   . Breast cancer ([place]) 2000  . Colon polyps 2015  . Depression   . Diabetes ([place])    type 2  - diet controlled   . Ductal carcinoma in situ of breast   . Family history of breast cancer   . Gallstones   . GERD (gastroesophageal reflux disease)   . HLD (hyperlipidemia)   . Localized swelling of both lower legs    on HCTZ  . 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 Obesity   . Personal history of chemotherapy   . Personal history of malignant neoplasm of breast   . Personal history of radiation therapy     Past Surgical History:  Procedure Laterality Date  . BACK SURGERY  2011  . BREAST LUMPECTOMY    . CHOLECYSTECTOMY  1980  . EVACUATION BREAST HEMATOMA Bilateral 11/26/2020   Procedure: EVACUATION POST-OP CHEST WALL HEMATOMA;  Surgeon: Donnie Mesa, MD;  Location: [place];  Service: General;  Laterality: Bilateral;  . LEFT MASTECTOMY AND RIGHT PROPHYLACTIC MASTECTOMY (Bilateral Breast)  11/25/2020  . TOTAL MASTECTOMY Bilateral 11/25/2020   Procedure: LEFT MASTECTOMY AND RIGHT PROPHYLACTIC MASTECTOMY;  Surgeon: Donnie Mesa, MD;  Location: [place];  Service: General;  Laterality: Bilateral;  LMA, PEC BLOCK; START TIME OF  7:30 AM FOR 120 MINUTES TSUEI IQ  . TUBAL LIGATION  1990    There were no vitals filed for this visit.   Subjective Assessment - 02/08/21 1502    Subjective Got measured for sleeve last Friday.  Have been doing the exercises;I hope I am doing them right. I don't hurt as bad since doing them.  I still feel hard in the bilateral chest regions.    Pertinent History Pt has a history of prior left breast CA with lumpectomy and extensive ALND of she believes 11 LN in 2000. She had radiation and chemotherapy at that time.  She had recent bilateral mastectomies with right being prophylactic.  Surgery note does not note right LN removal, however [name] note says 0/6 LN removed on right.  On 11/26/2020 pt had to have another surgery for evacuation of chest wall hematoma.  Her drains were removed on December 10, 2020.  Pt is disabled due to back pain.    How long can you stand comfortably? not long secondary to back pain    How long can you walk comfortably? not long secondary to back pain    Patient Stated Goals Decrease right arm pain/chest tightness    Currently in Pain? Yes    Pain Score 7     Pain Location Back  Pain Orientation Right;Left    Pain Type Chronic pain                             [facility] Adult PT Treatment/Exercise - 02/08/21 0001      Shoulder Exercises: Supine   Other Supine Exercises AA flex and stargazer x 5 ea    Other Supine Exercises AROM flex, scaption, horizontal abd x 5 ea      Manual Therapy   Myofascial Release to bilateral  Axillary areas of cording, chest region    Passive ROM Bilateral shoulder flex, scaption, abd ,ER, D2 flex                      PT Long Term Goals - 01/31/21 1149      PT LONG TERM GOAL #1   Title pt will be independent and compliant with HEP to improve ROM and strength of bilateral shoulders    Time 4    Period Weeks    Status New    Target Date 02/28/21      PT LONG TERM GOAL #2   Title pt will have  bilateral shoulder ROM WFL for normal performance of home activities    Time 6    Period Weeks    Status New    Target Date 03/14/21      PT LONG TERM GOAL #3   Title Pt will have decreased right arm and chest pain by greater than 50%    Time 6    Period Weeks    Status New    Target Date 03/14/21      PT LONG TERM GOAL #4   Title Pt will be fit for compression sleeve to for precautionary regions and can don/doff independently    Time 4    Period Weeks    Status New    Target Date 02/28/21      PT LONG TERM GOAL #5   Title pt will have improvement in quick dash by atleast 20 points to demonstrate improved function    Baseline 65%    Time 6    Period Weeks    Status New    Target Date 03/14/21                 Plan - 02/08/21 1546    Clinical Impression Statement Therapy consisted of AAROM and AROM exs, MFR techniques to cording and bilateral chest region.  PROM was performed to bilateral shoulders.  Cording is greatly improved especially on the right which was very limited last week.  She is no longer feeling pulling all the to the wrist on the right, and she is no longer feeling as muchswelling in the wrist and  in the fingers. PROM for bilateral shoulders is significantly better.    Personal Factors and Comorbidities Comorbidity 3+    Comorbidities reoccurence of left breast CA, prior radiation, prior chemo, on disability for back pain, LN removed bilaterally    Examination-Activity Limitations Bathing;Carry;Dressing;Lift;Stand;Sleep;Reach Overhead    Stability/Clinical Decision Making Stable/Uncomplicated    Rehab Potential Good    PT Frequency 2x / week    PT Duration 6 weeks    PT Treatment/Interventions Therapeutic exercise;Patient/family education;Manual techniques;Passive range of motion;Scar mobilization;Compression bandaging;Manual lymph drainage    PT Next Visit Plan Review HEP and progress to supine wand as able, chest stretch, MFR to cording on the right,  and bilateral chest regions,, PROM,  chip packs prn, monitor hand/wrist swelling.    PT Home Exercise Plan 4 post op exercises;to try supine flex and stargazer    Recommended Other Services measured for sleeve and jovi pak on 4/29    Consulted and Agree with Plan of Care Patient           Patient will benefit from skilled therapeutic intervention in order to improve the following deficits and impairments:  Decreased knowledge of precautions,Decreased range of motion,Decreased scar mobility,Decreased strength,Increased edema,Increased fascial restricitons,Impaired sensation,Postural dysfunction,Impaired UE functional use,Pain  Visit Diagnosis: Aftercare following surgery for neoplasm  Abnormal posture  Localized edema  Stiffness of right shoulder, not elsewhere classified  Stiffness of left shoulder, not elsewhere classified  Carcinoma in situ of left breast, unspecified type     Problem List Patient Active Problem List   Diagnosis Date Noted  . Neoplasm of left breast, primary tumor staging category Tis: ductal carcinoma in situ (DCIS) 11/25/2020  . Genetic testing 10/14/2020  . Family history of breast cancer   . Personal history of malignant neoplasm of breast   . Ductal carcinoma in situ of breast   . 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    Claris Pong 02/08/2021, 3:58 PM  [place], [place], [address] Phone: [phone]   Fax:  [phone]  Name: Lucile Hillmann MRN: [id number] Date of Birth: 25-Jul-1956  Cheral Almas, PT 02/08/21 3:59 PM

## 2021-02-10 ENCOUNTER — Ambulatory Visit: Payer: 59

## 2021-02-10 ENCOUNTER — Other Ambulatory Visit: Payer: Self-pay

## 2021-02-10 DIAGNOSIS — M25611 Stiffness of right shoulder, not elsewhere classified: Secondary | ICD-10-CM

## 2021-02-10 DIAGNOSIS — R293 Abnormal posture: Secondary | ICD-10-CM

## 2021-02-10 DIAGNOSIS — Z483 Aftercare following surgery for neoplasm: Secondary | ICD-10-CM

## 2021-02-10 DIAGNOSIS — M25612 Stiffness of left shoulder, not elsewhere classified: Secondary | ICD-10-CM

## 2021-02-10 DIAGNOSIS — R600 Localized edema: Secondary | ICD-10-CM

## 2021-02-10 DIAGNOSIS — D0592 Unspecified type of carcinoma in situ of left breast: Secondary | ICD-10-CM

## 2021-02-10 NOTE — Patient Instructions (Signed)
SHOULDER: Flexion - Supine (Cane)        Cancer Rehab [phone]    Hold cane in both hands. Raise arms up overhead. Do not allow back to arch. Hold _5__ seconds. Do __5-10__ times; __1-2__ times a day. 1. Hands shoulder width apart 2  Hands further apart (V position)   Shoulder Blade Stretch    Clasp fingers behind head with elbows touching in front of face. Pull elbows back while pressing shoulder blades together. Relax and hold as tolerated, can place pillow under elbow here for comfort as needed and to allow for prolonged stretch.  Repeat __5__ times. Do __1-2__ sessions per day.     SHOULDER: External Rotation - Supine (Cane)    Copyright  VHI. All rights reserved.

## 2021-02-10 NOTE — Therapy (Addendum)
[place], [place], [address] Phone: [phone]   Fax:  [phone]  Physical Therapy Treatment  Patient Details  Name: Candice Maldonado MRN: [id number] Date of Birth: 1956/06/04 Referring Provider (PT): Dr. Georgette Dover   Encounter Date: 02/10/2021   PT End of Session - 02/10/21 0915    Visit Number 3    Number of Visits 12    Date for PT Re-Evaluation 03/14/21    PT Start Time 0903    PT Stop Time 0958    PT Time Calculation (min) 55 min    Activity Tolerance Patient tolerated treatment well    Behavior During Therapy [facility] for tasks assessed/performed           Past Medical History:  Diagnosis Date  . Anemia    had blood transfusion  . Arthritis   . Breast cancer ([place]) 2000  . Colon polyps 2015  . Depression   . Diabetes ([place])    type 2  - diet controlled   . Ductal carcinoma in situ of breast   . Family history of breast cancer   . Gallstones   . GERD (gastroesophageal reflux disease)   . HLD (hyperlipidemia)   . Localized swelling of both lower legs    on HCTZ  . 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 Obesity   . Personal history of chemotherapy   . Personal history of malignant neoplasm of breast   . Personal history of radiation therapy     Past Surgical History:  Procedure Laterality Date  . BACK SURGERY  2011  . BREAST LUMPECTOMY    . CHOLECYSTECTOMY  1980  . EVACUATION BREAST HEMATOMA Bilateral 11/26/2020   Procedure: EVACUATION POST-OP CHEST WALL HEMATOMA;  Surgeon: Donnie Mesa, MD;  Location: [facility];  Service: General;  Laterality: Bilateral;  . LEFT MASTECTOMY AND RIGHT PROPHYLACTIC MASTECTOMY (Bilateral Breast)  11/25/2020  . TOTAL MASTECTOMY Bilateral 11/25/2020   Procedure: LEFT MASTECTOMY AND RIGHT PROPHYLACTIC MASTECTOMY;  Surgeon: Donnie Mesa, MD;  Location: [facility];  Service: General;  Laterality: Bilateral;  LMA, PEC BLOCK; START TIME OF  7:30 AM FOR 120 MINUTES TSUEI IQ  . TUBAL LIGATION  1990    There were no vitals filed for this visit.   Subjective Assessment - 02/10/21 0902    Subjective Did fine after last visit.  Right side of neck was really sore the night I left here.No present pain because I took pain medicine for my back.Across my chest feels tight and the right axillary region.  Sleeves have not come in yet.    Pertinent History Pt has a history of prior left breast CA with lumpectomy and extensive ALND of she believes 11 LN in 2000. She had radiation and chemotherapy at that time.  She had recent bilateral mastectomies with right being prophylactic.  Surgery note does not note right LN removal, however [name] note says 0/6 LN removed on right.  On 11/26/2020 pt had to have another surgery for evacuation of chest wall hematoma.  Her drains were removed on December 10, 2020.  Pt is disabled due to back pain.    How long can you stand comfortably? not long secondary to back pain    How long can you walk comfortably? not long secondary to back pain    Patient Stated Goals Decrease right arm pain/chest tightness    Currently in Pain? Yes    Pain Score  6     Pain Location Back    Pain Orientation Right;Left    Pain Descriptors / Indicators Tightness;Aching    Pain Onset More than a month ago    Pain Frequency Intermittent                             [facility] Adult PT Treatment/Exercise - 02/10/21 0001      Shoulder Exercises: Supine   Other Supine Exercises AAflex and scaption with wand, stargazer x 5    Other Supine Exercises AROM flex, scaption, horizontal abd x 5 ea      Manual Therapy   Soft tissue mobilization to bilateral UT/pectorals lats    Myofascial Release to bilateral areas of cording, chest region    Passive ROM Bilateral shoulder flex, scaption, abd                  PT Education - 02/10/21 0921    Education Details pt was educated in supine wand for flexion and scaption     Person(s) Educated Patient    Methods Explanation;Handout    Comprehension Verbalized understanding;Returned demonstration               PT Long Term Goals - 01/31/21 1149      PT LONG TERM GOAL #1   Title pt will be independent and compliant with HEP to improve ROM and strength of bilateral shoulders    Time 4    Period Weeks    Status New    Target Date 02/28/21      PT LONG TERM GOAL #2   Title pt will have bilateral shoulder ROM WFL for normal performance of home activities    Time 6    Period Weeks    Status New    Target Date 03/14/21      PT LONG TERM GOAL #3   Title Pt will have decreased right arm and chest pain by greater than 50%    Time 6    Period Weeks    Status New    Target Date 03/14/21      PT LONG TERM GOAL #4   Title Pt will be fit for compression sleeve to for precautionary regions and can don/doff independently    Time 4    Period Weeks    Status New    Target Date 02/28/21      PT LONG TERM GOAL #5   Title pt will have improvement in quick dash by atleast 20 points to demonstrate improved function    Baseline 65%    Time 6    Period Weeks    Status New    Target Date 03/14/21                 Plan - 02/10/21 0959    Clinical Impression Statement Pt with considerable tenderness today at bilateral axillary border of pecs but loosened up well especially when done with hand behind head. Chest area continues with significant firmness and tightness.  Overall cording is greatly improved with several small cords noted in the right axillary region.  PROM continues to improve and pt is very compliant with HEP.  We are still awaiting her compression garments    Personal Factors and Comorbidities Comorbidity 3+    Comorbidities reoccurence of left breast CA, prior radiation, prior chemo, on disability for back pain, LN removed bilaterally    Examination-Activity Limitations Bathing;Carry;Dressing;Lift;Stand;Sleep;Reach Overhead  Stability/Clinical Decision Making Stable/Uncomplicated    Rehab Potential Good    PT Frequency 2x / week    PT Duration 6 weeks    PT Treatment/Interventions Therapeutic exercise;Patient/family education;Manual techniques;Passive range of motion;Scar mobilization;Compression bandaging;Manual lymph drainage    PT Next Visit Plan chest stretch, MFR to cording on the right, and bilateral chest regions,,STM to pecs/axilla, chest prn PROM, chip packs prn, monitor hand/wrist swelling.    PT Home Exercise Plan 4 post op exercises;to try supine flex and stargazer, supine wand flex and scap    Consulted and Agree with Plan of Care Patient           Patient will benefit from skilled therapeutic intervention in order to improve the following deficits and impairments:  Decreased knowledge of precautions,Decreased range of motion,Decreased scar mobility,Decreased strength,Increased edema,Increased fascial restricitons,Impaired sensation,Postural dysfunction,Impaired UE functional use,Pain  Visit Diagnosis: Aftercare following surgery for neoplasm  Abnormal posture  Localized edema  Stiffness of right shoulder, not elsewhere classified  Stiffness of left shoulder, not elsewhere classified  Carcinoma in situ of left breast, unspecified type     Problem List Patient Active Problem List   Diagnosis Date Noted  . Neoplasm of left breast, primary tumor staging category Tis: ductal carcinoma in situ (DCIS) 11/25/2020  . Genetic testing 10/14/2020  . Family history of breast cancer   . Personal history of malignant neoplasm of breast   . Ductal carcinoma in situ of breast   . 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  02/11/2021 Addendum; Pt arrived today without an apt.  She brought her 2 compression sleeves a gauntlet and glove, as well as her breast jovipak.  She was able to be seen and was instructed in proper way to don jovipak with velcro in the back.  She was also instructed  in donning her sleeve, wear time and laundering.  Garments all fit well and pt had no questions when leaving.  Claris Pong 02/10/2021, 10:58 AM  [place], [place], [address] Phone: [phone]   Fax:  [phone]  Name: Banessa Mao MRN: [id number] Date of Birth: 11-Aug-1956 Cheral Almas, PT 02/10/21 10:58 AM Cheral Almas, PT 02/11/21 8:39 AM

## 2021-02-15 ENCOUNTER — Other Ambulatory Visit: Payer: Self-pay

## 2021-02-15 ENCOUNTER — Ambulatory Visit: Payer: 59

## 2021-02-15 DIAGNOSIS — M25612 Stiffness of left shoulder, not elsewhere classified: Secondary | ICD-10-CM

## 2021-02-15 DIAGNOSIS — R293 Abnormal posture: Secondary | ICD-10-CM

## 2021-02-15 DIAGNOSIS — Z483 Aftercare following surgery for neoplasm: Secondary | ICD-10-CM

## 2021-02-15 DIAGNOSIS — R600 Localized edema: Secondary | ICD-10-CM

## 2021-02-15 DIAGNOSIS — D0592 Unspecified type of carcinoma in situ of left breast: Secondary | ICD-10-CM

## 2021-02-15 DIAGNOSIS — M25611 Stiffness of right shoulder, not elsewhere classified: Secondary | ICD-10-CM

## 2021-02-15 NOTE — Therapy (Signed)
[place], [place], [address] Phone: [phone]   Fax:  [phone]  Physical Therapy Treatment  Patient Details  Name: Candice Maldonado MRN: [id number] Date of Birth: 21-Sep-1956 Referring Provider (PT): Dr. Georgette Dover   Encounter Date: 02/15/2021   PT End of Session - 02/15/21 0948    Visit Number 4    Number of Visits 12    Date for PT Re-Evaluation 03/14/21    PT Start Time 0903    PT Stop Time 0959    PT Time Calculation (min) 56 min    Activity Tolerance Patient tolerated treatment well    Behavior During Therapy [facility] for tasks assessed/performed           Past Medical History:  Diagnosis Date  . Anemia    had blood transfusion  . Arthritis   . Breast cancer ([place]) 2000  . Colon polyps 2015  . Depression   . Diabetes ([place])    type 2  - diet controlled   . Ductal carcinoma in situ of breast   . Family history of breast cancer   . Gallstones   . GERD (gastroesophageal reflux disease)   . HLD (hyperlipidemia)   . Localized swelling of both lower legs    on HCTZ  . 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 Obesity   . Personal history of chemotherapy   . Personal history of malignant neoplasm of breast   . Personal history of radiation therapy     Past Surgical History:  Procedure Laterality Date  . BACK SURGERY  2011  . BREAST LUMPECTOMY    . CHOLECYSTECTOMY  1980  . EVACUATION BREAST HEMATOMA Bilateral 11/26/2020   Procedure: EVACUATION POST-OP CHEST WALL HEMATOMA;  Surgeon: Donnie Mesa, MD;  Location: [facility];  Service: General;  Laterality: Bilateral;  . LEFT MASTECTOMY AND RIGHT PROPHYLACTIC MASTECTOMY (Bilateral Breast)  11/25/2020  . TOTAL MASTECTOMY Bilateral 11/25/2020   Procedure: LEFT MASTECTOMY AND RIGHT PROPHYLACTIC MASTECTOMY;  Surgeon: Donnie Mesa, MD;  Location: [facility];  Service: General;  Laterality: Bilateral;  LMA, PEC BLOCK; START TIME OF  7:30 AM FOR 120 MINUTES TSUEI IQ  . TUBAL LIGATION  1990    There were no vitals filed for this visit.   Subjective Assessment - 02/15/21 0904    Subjective Wearing the chest pack and it feels good.  Chest tightness feels better.  Wearing bilateral arm sleeves and right glove. Wrist swelling is better but thumb is still swollen.  Fingers don't feel as tight. Slept good last night Maybe because I went to the pool yesterday and swam with my grandson.    Pertinent History Pt has a history of prior left breast CA with lumpectomy and extensive ALND of she believes 11 LN in 2000. She had radiation and chemotherapy at that time.  She had recent bilateral mastectomies with right being prophylactic.  Surgery note does not note right LN removal, however [name] note says 0/6 LN removed on right.  On 11/26/2020 pt had to have another surgery for evacuation of chest wall hematoma.  Her drains were removed on December 10, 2020.  Pt is disabled due to back pain.    How long can you stand comfortably? not long secondary to back pain    How long can you walk comfortably? not long secondary to back pain    Patient Stated Goals Decrease right arm pain/chest tightness  Currently in Pain? Yes    Pain Score 7     Pain Location Back    Pain Orientation Right;Left    Pain Descriptors / Indicators Aching;Tightness    Pain Type Chronic pain    Pain Onset More than a month ago    Pain Frequency Intermittent    Pain Score 0    Pain Location Chest                             [facility] Adult PT Treatment/Exercise - 02/15/21 0001      Shoulder Exercises: Supine   Other Supine Exercises AAflex and scaption with wand, stargazer x 5    Other Supine Exercises AROM flex, scaption, horizontal abd x 5 ea      Manual Therapy   Soft tissue mobilization to bilateral UT/pectorals lats, scar massage bilateral mastectomy incisions    Myofascial Release toright areas of cording, Bilateral chest region     Passive ROM Bilateral shoulder flex, scaption, abd, ER, D2 flex                      PT Long Term Goals - 01/31/21 1149      PT LONG TERM GOAL #1   Title pt will be independent and compliant with HEP to improve ROM and strength of bilateral shoulders    Time 4    Period Weeks    Status New    Target Date 02/28/21      PT LONG TERM GOAL #2   Title pt will have bilateral shoulder ROM WFL for normal performance of home activities    Time 6    Period Weeks    Status New    Target Date 03/14/21      PT LONG TERM GOAL #3   Title Pt will have decreased right arm and chest pain by greater than 50%    Time 6    Period Weeks    Status New    Target Date 03/14/21      PT LONG TERM GOAL #4   Title Pt will be fit for compression sleeve to for precautionary regions and can don/doff independently    Time 4    Period Weeks    Status New    Target Date 02/28/21      PT LONG TERM GOAL #5   Title pt will have improvement in quick dash by atleast 20 points to demonstrate improved function    Baseline 65%    Time 6    Period Weeks    Status New    Target Date 03/14/21                 Plan - 02/15/21 0948    Clinical Impression Statement Pt has been compliant with sleeve wear and chest jovipad.  Left chest above incision, and right chest below incision are still very firm despite jovi mastectomy pad.  Several small cords still noted in right axillary region.  Tenderness/tighntess still palpable in pecs and lats with mild tenderness. PROM continues to improve   Personal Factors and Comorbidities Comorbidity 3+    Comorbidities reoccurence of left breast CA, prior radiation, prior chemo, on disability for back pain, LN removed bilaterally    Examination-Activity Limitations Bathing;Carry;Dressing;Lift;Stand;Sleep;Reach Overhead    Stability/Clinical Decision Making Stable/Uncomplicated    Rehab Potential Good    PT Frequency 2x / week    PT Duration 6  weeks    PT  Treatment/Interventions Therapeutic exercise;Patient/family education;Manual techniques;Passive range of motion;Scar mobilization;Compression bandaging;Manual lymph drainage    PT Next Visit Plan chest stretch, MFR to cording on the right, and bilateral chest regions,,STM to pecs/axilla, chest prn PROM, chip packs prn, monitor hand/wrist swelling.    PT Home Exercise Plan 4 post op exercises;to try supine flex and stargazer, supine wand flex and scap    Consulted and Agree with Plan of Care Patient           Patient will benefit from skilled therapeutic intervention in order to improve the following deficits and impairments:  Decreased knowledge of precautions,Decreased range of motion,Decreased scar mobility,Decreased strength,Increased edema,Increased fascial restricitons,Impaired sensation,Postural dysfunction,Impaired UE functional use,Pain  Visit Diagnosis: Aftercare following surgery for neoplasm  Abnormal posture  Localized edema  Stiffness of right shoulder, not elsewhere classified  Stiffness of left shoulder, not elsewhere classified  Carcinoma in situ of left breast, unspecified type     Problem List Patient Active Problem List   Diagnosis Date Noted  . Neoplasm of left breast, primary tumor staging category Tis: ductal carcinoma in situ (DCIS) 11/25/2020  . Genetic testing 10/14/2020  . Family history of breast cancer   . Personal history of malignant neoplasm of breast   . Ductal carcinoma in situ of breast   . 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    Claris Pong 02/15/2021, 9:55 AM  [place], [place], [address] Phone: [phone]   Fax:  [phone]  Name: Teruko Joswick MRN: [id number] Date of Birth: [birth year] Cheral Almas, PT 02/15/21 9:56 AM

## 2021-02-17 ENCOUNTER — Other Ambulatory Visit: Payer: Self-pay

## 2021-02-17 ENCOUNTER — Ambulatory Visit: Payer: 59

## 2021-02-17 DIAGNOSIS — D0592 Unspecified type of carcinoma in situ of left breast: Secondary | ICD-10-CM

## 2021-02-17 DIAGNOSIS — R293 Abnormal posture: Secondary | ICD-10-CM

## 2021-02-17 DIAGNOSIS — R600 Localized edema: Secondary | ICD-10-CM

## 2021-02-17 DIAGNOSIS — Z483 Aftercare following surgery for neoplasm: Secondary | ICD-10-CM

## 2021-02-17 DIAGNOSIS — M25612 Stiffness of left shoulder, not elsewhere classified: Secondary | ICD-10-CM

## 2021-02-17 DIAGNOSIS — M25611 Stiffness of right shoulder, not elsewhere classified: Secondary | ICD-10-CM

## 2021-02-17 NOTE — Therapy (Signed)
[place], [place], [address] Phone: [phone]   Fax:  [phone]  Physical Therapy Treatment  Patient Details  Name: Candice Maldonado MRN: [id number] Date of Birth: 1956-09-08 Referring Provider (PT): Dr. Georgette Dover   Encounter Date: 02/17/2021   PT End of Session - 02/17/21 0953    Visit Number 5    Number of Visits 12    Date for PT Re-Evaluation 03/14/21    PT Start Time 0904    PT Stop Time 0956    PT Time Calculation (min) 52 min    Activity Tolerance Patient tolerated treatment well    Behavior During Therapy [facility] for tasks assessed/performed           Past Medical History:  Diagnosis Date  . Anemia    had blood transfusion  . Arthritis   . Breast cancer ([place]) 2000  . Colon polyps 2015  . Depression   . Diabetes ([place])    type 2  - diet controlled   . Ductal carcinoma in situ of breast   . Family history of breast cancer   . Gallstones   . GERD (gastroesophageal reflux disease)   . HLD (hyperlipidemia)   . Localized swelling of both lower legs    on HCTZ  . 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 Obesity   . Personal history of chemotherapy   . Personal history of malignant neoplasm of breast   . Personal history of radiation therapy     Past Surgical History:  Procedure Laterality Date  . BACK SURGERY  2011  . BREAST LUMPECTOMY    . CHOLECYSTECTOMY  1980  . EVACUATION BREAST HEMATOMA Bilateral 11/26/2020   Procedure: EVACUATION POST-OP CHEST WALL HEMATOMA;  Surgeon: Donnie Mesa, MD;  Location: [facility];  Service: General;  Laterality: Bilateral;  . LEFT MASTECTOMY AND RIGHT PROPHYLACTIC MASTECTOMY (Bilateral Breast)  11/25/2020  . TOTAL MASTECTOMY Bilateral 11/25/2020   Procedure: LEFT MASTECTOMY AND RIGHT PROPHYLACTIC MASTECTOMY;  Surgeon: Donnie Mesa, MD;  Location: [facility];  Service: General;  Laterality: Bilateral;  LMA, PEC BLOCK; START TIME OF  7:30 AM FOR 120 MINUTES TSUEI IQ  . TUBAL LIGATION  1990    There were no vitals filed for this visit.   Subjective Assessment - 02/17/21 0907    Subjective The chest pad seems to be helping.  My hand and wrist is more swollen today but I washed the sleeve and it is not dry.  My family reunion is in [place], [place].  Shoulders are doing well.    Pertinent History Pt has a history of prior left breast CA with lumpectomy and extensive ALND of she believes 11 LN in 2000. She had radiation and chemotherapy at that time.  She had recent bilateral mastectomies with right being prophylactic.  Surgery note does not note right LN removal, however [name] note says 0/6 LN removed on right.  On 11/26/2020 pt had to have another surgery for evacuation of chest wall hematoma.  Her drains were removed on December 10, 2020.  Pt is disabled due to back pain.    How long can you stand comfortably? not long secondary to back pain    How long can you walk comfortably? not long secondary to back pain    Patient Stated Goals Decrease right arm pain/chest tightness    Currently in Pain? Yes    Pain Score 8  Pain Location Back    Pain Orientation Right;Left    Pain Descriptors / Indicators Aching;Tightness    Pain Type Chronic pain    Pain Onset More than a month ago    Pain Frequency Intermittent    Pain Score 0                             [facility] Adult PT Treatment/Exercise - 02/17/21 0001      Shoulder Exercises: Supine   Other Supine Exercises AROM flex, scaption, horizontal abd x 5 ea      Shoulder Exercises: Standing   Other Standing Exercises standing lat stretch x 3 at counter      Manual Therapy   Soft tissue mobilization to bilateral UT/pectorals lats, scar massage bilateral mastectomy incisions    Myofascial Release toright areas of cording, Bilateral chest region    Passive ROM Bilateral shoulder flex, scaption, abd                       PT Long Term Goals -  01/31/21 1149      PT LONG TERM GOAL #1   Title pt will be independent and compliant with HEP to improve ROM and strength of bilateral shoulders    Time 4    Period Weeks    Status New    Target Date 02/28/21      PT LONG TERM GOAL #2   Title pt will have bilateral shoulder ROM WFL for normal performance of home activities    Time 6    Period Weeks    Status New    Target Date 03/14/21      PT LONG TERM GOAL #3   Title Pt will have decreased right arm and chest pain by greater than 50%    Time 6    Period Weeks    Status New    Target Date 03/14/21      PT LONG TERM GOAL #4   Title Pt will be fit for compression sleeve to for precautionary regions and can don/doff independently    Time 4    Period Weeks    Status New    Target Date 02/28/21      PT LONG TERM GOAL #5   Title pt will have improvement in quick dash by atleast 20 points to demonstrate improved function    Baseline 65%    Time 6    Period Weeks    Status New    Target Date 03/14/21                 Plan - 02/17/21 0948    Clinical Impression Statement Pt continues with firmnes at right chest incision below scar and left chest laterally above incision.  A/PROM is overall improved but pt does feel cording some on the right.  No "pops" felt with MFR and cording is much improved but one cord at right anterior  axillary region still runs to lateral side of arm and is slightly limiting. Pts right hand and wrist is visibly swollen today.  Advised to put on sleeve and glove when she gets home.  Pt may benefit from MLD    Personal Factors and Comorbidities Comorbidity 3+    Comorbidities reoccurence of left breast CA, prior radiation, prior chemo, on disability for back pain, LN removed bilaterally    Examination-Activity Limitations Bathing;Carry;Dressing;Lift;Stand;Sleep;Reach Overhead    Stability/Clinical Decision Making Stable/Uncomplicated  Rehab Potential Good    PT Frequency 2x / week    PT Duration  6 weeks    PT Treatment/Interventions Therapeutic exercise;Patient/family education;Manual techniques;Passive range of motion;Scar mobilization;Compression bandaging;Manual lymph drainage    PT Next Visit Plan Consider MLD to right UE,chest stretch, MFR to cording on the right, and bilateral chest regions,,STM to pecs/axilla, chest prn PROM, chip packs prn, monitor hand/wrist swelling.    PT Home Exercise Plan 4 post op exercises;to try supine flex and stargazer, supine wand flex and scap    Consulted and Agree with Plan of Care Patient           Patient will benefit from skilled therapeutic intervention in order to improve the following deficits and impairments:  Decreased knowledge of precautions,Decreased range of motion,Decreased scar mobility,Decreased strength,Increased edema,Increased fascial restricitons,Impaired sensation,Postural dysfunction,Impaired UE functional use,Pain  Visit Diagnosis: Aftercare following surgery for neoplasm  Abnormal posture  Localized edema  Stiffness of right shoulder, not elsewhere classified  Stiffness of left shoulder, not elsewhere classified  Carcinoma in situ of left breast, unspecified type     Problem List Patient Active Problem List   Diagnosis Date Noted  . Neoplasm of left breast, primary tumor staging category Tis: ductal carcinoma in situ (DCIS) 11/25/2020  . Genetic testing 10/14/2020  . Family history of breast cancer   . Personal history of malignant neoplasm of breast   . Ductal carcinoma in situ of breast   . 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    Claris Pong 02/17/2021, 10:09 AM  [place], [place], [address] Phone: [phone]   Fax:  [phone]  Name: Saranda Legrande MRN: [id number] Date of Birth: [birth year] Cheral Almas, PT 02/17/21 10:11 AM

## 2021-02-22 ENCOUNTER — Other Ambulatory Visit: Payer: Self-pay

## 2021-02-22 ENCOUNTER — Ambulatory Visit: Payer: 59

## 2021-02-22 DIAGNOSIS — R600 Localized edema: Secondary | ICD-10-CM

## 2021-02-22 DIAGNOSIS — M25612 Stiffness of left shoulder, not elsewhere classified: Secondary | ICD-10-CM

## 2021-02-22 DIAGNOSIS — Z483 Aftercare following surgery for neoplasm: Secondary | ICD-10-CM | POA: Diagnosis not present

## 2021-02-22 DIAGNOSIS — R293 Abnormal posture: Secondary | ICD-10-CM

## 2021-02-22 DIAGNOSIS — M25611 Stiffness of right shoulder, not elsewhere classified: Secondary | ICD-10-CM

## 2021-02-22 DIAGNOSIS — D0592 Unspecified type of carcinoma in situ of left breast: Secondary | ICD-10-CM

## 2021-02-22 NOTE — Therapy (Signed)
[place], [place], [address] Phone: [phone]   Fax:  [phone]  Physical Therapy Treatment  Patient Details  Name: Candice Maldonado MRN: [id number] Date of Birth: June 07, 1956 Referring Provider (PT): Dr. Georgette Dover   Encounter Date: 02/22/2021   PT End of Session - 02/22/21 1226    Visit Number 6    Number of Visits 12    Date for PT Re-Evaluation 03/14/21    PT Start Time 1005    PT Stop Time 1101    PT Time Calculation (min) 56 min    Activity Tolerance Patient tolerated treatment well    Behavior During Therapy [facility] for tasks assessed/performed           Past Medical History:  Diagnosis Date  . Anemia    had blood transfusion  . Arthritis   . Breast cancer ([place]) 2000  . Colon polyps 2015  . Depression   . Diabetes ([place])    type 2  - diet controlled   . Ductal carcinoma in situ of breast   . Family history of breast cancer   . Gallstones   . GERD (gastroesophageal reflux disease)   . HLD (hyperlipidemia)   . Localized swelling of both lower legs    on HCTZ  . 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 Obesity   . Personal history of chemotherapy   . Personal history of malignant neoplasm of breast   . Personal history of radiation therapy     Past Surgical History:  Procedure Laterality Date  . BACK SURGERY  2011  . BREAST LUMPECTOMY    . CHOLECYSTECTOMY  1980  . EVACUATION BREAST HEMATOMA Bilateral 11/26/2020   Procedure: EVACUATION POST-OP CHEST WALL HEMATOMA;  Surgeon: Donnie Mesa, MD;  Location: [place];  Service: General;  Laterality: Bilateral;  . LEFT MASTECTOMY AND RIGHT PROPHYLACTIC MASTECTOMY (Bilateral Breast)  11/25/2020  . TOTAL MASTECTOMY Bilateral 11/25/2020   Procedure: LEFT MASTECTOMY AND RIGHT PROPHYLACTIC MASTECTOMY;  Surgeon: Donnie Mesa, MD;  Location: [place];  Service: General;  Laterality: Bilateral;  LMA, PEC BLOCK; START TIME OF  7:30 AM FOR 120 MINUTES TSUEI IQ  . TUBAL LIGATION  1990    There were no vitals filed for this visit.   Subjective Assessment - 02/22/21 1007    Subjective I had problems with the right side of my chest tightening up yesterday and had some pain at the anterior axillary region. Still get some swelling at right thumb even with my glove on.  I wake up with the hand more swollen most days    Pertinent History Pt has a history of prior left breast CA with lumpectomy and extensive ALND of she believes 11 LN in 2000. She had radiation and chemotherapy at that time.  She had recent bilateral mastectomies with right being prophylactic.  Surgery note does not note right LN removal, however [name] note says 0/6 LN removed on right.  On 11/26/2020 pt had to have another surgery for evacuation of chest wall hematoma.  Her drains were removed on December 10, 2020.  Pt is disabled due to back pain.    How long can you stand comfortably? not long secondary to back pain    How long can you walk comfortably? not long secondary to back pain    Patient Stated Goals Decrease right arm pain/chest tightness    Currently in Pain? Yes    Pain  Score 8     Pain Location Back    Pain Orientation Right;Left    Pain Descriptors / Indicators Aching;Tightness    Pain Type Chronic pain    Pain Onset More than a month ago    Pain Frequency Intermittent    Multiple Pain Sites No              [facility] PT Assessment - 02/22/21 0001      Assessment   Medical Diagnosis Bilateral             LYMPHEDEMA/ONCOLOGY QUESTIONNAIRE - 02/22/21 0001      Right Upper Extremity Lymphedema   15 cm Proximal to Olecranon Process 34.5 cm    10 cm Proximal to Olecranon Process 34.3 cm    Olecranon Process 27.6 cm    15 cm Proximal to Ulnar Styloid Process 24.9 cm    10 cm Proximal to Ulnar Styloid Process 21.3 cm    Just Proximal to Ulnar Styloid Process 17.5 cm    Across Hand at [employer] 21.4 cm    At [place] of 2nd  Digit 6.7 cm                      [facility] Adult PT Treatment/Exercise - 02/22/21 0001      Shoulder Exercises: Supine   Other Supine Exercises AAflex and scaption with wand, stargazer x 5      Manual Therapy   Soft tissue mobilization to bilateral UT/pectorals lats, scar massage bilateral mastectomy incisions    Myofascial Release to right areas of cording, Bilateral chest region    Compression Bandaging Medium TG soft, short artiflex, elastomull to fingers 1-4, 6 cm wrap to hand and 1 10 cm wrap to arm to decrease right hand swelling    Passive ROM Bilateral shoulder flex, scaption, abd, d 2 flex                       PT Long Term Goals - 01/31/21 1149      PT LONG TERM GOAL #1   Title pt will be independent and compliant with HEP to improve ROM and strength of bilateral shoulders    Time 4    Period Weeks    Status New    Target Date 02/28/21      PT LONG TERM GOAL #2   Title pt will have bilateral shoulder ROM WFL for normal performance of home activities    Time 6    Period Weeks    Status New    Target Date 03/14/21      PT LONG TERM GOAL #3   Title Pt will have decreased right arm and chest pain by greater than 50%    Time 6    Period Weeks    Status New    Target Date 03/14/21      PT LONG TERM GOAL #4   Title Pt will be fit for compression sleeve to for precautionary regions and can don/doff independently    Time 4    Period Weeks    Status New    Target Date 02/28/21      PT LONG TERM GOAL #5   Title pt will have improvement in quick dash by atleast 20 points to demonstrate improved function    Baseline 65%    Time 6    Period Weeks    Status New    Target Date 03/14/21  Plan - 02/22/21 1227    Clinical Impression Statement Therapy continued with soft tissue mobilization to bilaterl pectoralis and lats, MFR techniques to right cording and bilateral chest region, and PROM was performed to bilater shoulders.   Treatment was modified today due to significant swelling at the thumb side of the right hand.  Pts fingers, hand and arm were wrapped as described in flow sheet.  Pt will remove in the morning and see how hand looks so she can report back to me next visit. Tightness and discomfort pt felt when she first arrived was improved after she did her stretches and manual techniques were performed.  She had been away all weekend and was unable to stretch. We discussed laundering of her chest pad to see if it could be shrunk a little since it is stretched out.    Personal Factors and Comorbidities Comorbidity 3+    Comorbidities reoccurence of left breast CA, prior radiation, prior chemo, on disability for back pain, LN removed bilaterally    Examination-Activity Limitations Bathing;Carry;Dressing;Lift;Stand;Sleep;Reach Overhead    Stability/Clinical Decision Making Stable/Uncomplicated    Rehab Potential Good    PT Frequency 2x / week    PT Duration 6 weeks    PT Treatment/Interventions Therapeutic exercise;Patient/family education;Manual techniques;Passive range of motion;Scar mobilization;Compression bandaging;Manual lymph drainage    PT Next Visit Plan Check goals,See how wrap did with swelling,Consider MLD to right UE,chest stretch, MFR to cording on the right, and bilateral chest regions,,STM to pecs/axilla, chest prn PROM, chip packs prn, monitor hand/wrist swelling.    PT Home Exercise Plan 4 post op exercises;to try supine flex and stargazer, supine wand flex and scap    Consulted and Agree with Plan of Care Patient           Patient will benefit from skilled therapeutic intervention in order to improve the following deficits and impairments:  Decreased knowledge of precautions,Decreased range of motion,Decreased scar mobility,Decreased strength,Increased edema,Increased fascial restricitons,Impaired sensation,Postural dysfunction,Impaired UE functional use,Pain  Visit Diagnosis: Aftercare  following surgery for neoplasm  Abnormal posture  Localized edema  Stiffness of right shoulder, not elsewhere classified  Stiffness of left shoulder, not elsewhere classified  Carcinoma in situ of left breast, unspecified type     Problem List Patient Active Problem List   Diagnosis Date Noted  . Neoplasm of left breast, primary tumor staging category Tis: ductal carcinoma in situ (DCIS) 11/25/2020  . Genetic testing 10/14/2020  . Family history of breast cancer   . Personal history of malignant neoplasm of breast   . Ductal carcinoma in situ of breast   . 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    Claris Pong 02/22/2021, 12:32 PM  [place], [place], [address] Phone: [phone]   Fax:  [phone]  Name: Helvi Royals MRN: [id number] Date of Birth: Aug 23, 1956  Cheral Almas, PT 02/22/21 12:34 PM

## 2021-02-24 ENCOUNTER — Other Ambulatory Visit: Payer: Self-pay

## 2021-02-24 ENCOUNTER — Ambulatory Visit: Payer: 59

## 2021-02-24 DIAGNOSIS — Z483 Aftercare following surgery for neoplasm: Secondary | ICD-10-CM | POA: Diagnosis not present

## 2021-02-24 DIAGNOSIS — R600 Localized edema: Secondary | ICD-10-CM

## 2021-02-24 DIAGNOSIS — M25612 Stiffness of left shoulder, not elsewhere classified: Secondary | ICD-10-CM

## 2021-02-24 DIAGNOSIS — D0592 Unspecified type of carcinoma in situ of left breast: Secondary | ICD-10-CM

## 2021-02-24 DIAGNOSIS — R293 Abnormal posture: Secondary | ICD-10-CM

## 2021-02-24 DIAGNOSIS — M25611 Stiffness of right shoulder, not elsewhere classified: Secondary | ICD-10-CM

## 2021-02-24 NOTE — Therapy (Signed)
[place], [place], [address] Phone: [phone]   Fax:  [phone]  Physical Therapy Treatment  Patient Details  Name: Candice Maldonado MRN: [id number] Date of Birth: Jun 13, 1956 Referring Provider (PT): Dr. Georgette Dover   Encounter Date: 02/24/2021   PT End of Session - 02/24/21 0935    Visit Number 7    Number of Visits 12    Date for PT Re-Evaluation 03/14/21    PT Start Time 0900    PT Stop Time 0955    PT Time Calculation (min) 55 min    Activity Tolerance Patient tolerated treatment well    Behavior During Therapy [facility] for tasks assessed/performed           Past Medical History:  Diagnosis Date  . Anemia    had blood transfusion  . Arthritis   . Breast cancer ([place]) 2000  . Colon polyps 2015  . Depression   . Diabetes ([place])    type 2  - diet controlled   . Ductal carcinoma in situ of breast   . Family history of breast cancer   . Gallstones   . GERD (gastroesophageal reflux disease)   . HLD (hyperlipidemia)   . Localized swelling of both lower legs    on HCTZ  . 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 Obesity   . Personal history of chemotherapy   . Personal history of malignant neoplasm of breast   . Personal history of radiation therapy     Past Surgical History:  Procedure Laterality Date  . BACK SURGERY  2011  . BREAST LUMPECTOMY    . CHOLECYSTECTOMY  1980  . EVACUATION BREAST HEMATOMA Bilateral 11/26/2020   Procedure: EVACUATION POST-OP CHEST WALL HEMATOMA;  Surgeon: Donnie Mesa, MD;  Location: [place];  Service: General;  Laterality: Bilateral;  . LEFT MASTECTOMY AND RIGHT PROPHYLACTIC MASTECTOMY (Bilateral Breast)  11/25/2020  . TOTAL MASTECTOMY Bilateral 11/25/2020   Procedure: LEFT MASTECTOMY AND RIGHT PROPHYLACTIC MASTECTOMY;  Surgeon: Donnie Mesa, MD;  Location: [place];  Service: General;  Laterality: Bilateral;  LMA, PEC BLOCK; START TIME OF  7:30 AM FOR 120 MINUTES TSUEI IQ  . TUBAL LIGATION  1990    There were no vitals filed for this visit.   Subjective Assessment - 02/24/21 0902    Subjective Took the wrap off yesterday evening and the swelling at my hand was much better.  I washed the black chest wrap but it was still wet so I didn't put it on this am. shest and axillary region are feeling much better since we stretched last time.  My arms feel weak.   Pertinent History Pt has a history of prior left breast CA with lumpectomy and extensive ALND of she believes 11 LN in 2000. She had radiation and chemotherapy at that time.  She had recent bilateral mastectomies with right being prophylactic.  Surgery note does not note right LN removal, however [name] note says 0/6 LN removed on right.  On 11/26/2020 pt had to have another surgery for evacuation of chest wall hematoma.  Her drains were removed on December 10, 2020.  Pt is disabled due to back pain.    How long can you stand comfortably? not long secondary to back pain    How long can you walk comfortably? not long secondary to back pain    Patient Stated Goals Decrease right arm pain/chest tightness    Currently  in Pain? Yes    Pain Score 5     Pain Location Back    Pain Orientation Right;Left    Pain Descriptors / Indicators Aching;Tightness    Pain Type Chronic pain    Pain Onset More than a month ago    Pain Frequency Intermittent                             [facility] Adult PT Treatment/Exercise - 02/24/21 0001      Shoulder Exercises: Supine   Horizontal ABduction Strengthening;Both;10 reps    Theraband Level (Shoulder Horizontal ABduction) Level 1 (Yellow)    External Rotation Strengthening;Both;10 reps    Theraband Level (Shoulder External Rotation) Level 1 (Yellow)      Shoulder Exercises: Standing   Flexion Strengthening;Both;10 reps    Theraband Level (Shoulder Flexion) Level 1 (Yellow)    Retraction Both;10 reps    Theraband Level (Shoulder  Retraction) Level 1 (Yellow)      Manual Therapy   Myofascial Release to right areas of cording, Bilateral chest region    Passive ROM Bilateral shoulder flex, scaption, abd, d 2 flex                  PT Education - 02/24/21 0957    Education Details Pt educated in supine scap series except diagonals, standing scap retraction and biceps curls all with yellow TB x 10    Person(s) Educated Patient    Methods Demonstration;Handout    Comprehension Returned demonstration               PT Long Term Goals - 02/24/21 0911      PT LONG TERM GOAL #1   Title pt will be independent and compliant with HEP to improve ROM and strength of bilateral shoulders    Time 4    Period Weeks    Status Achieved      PT LONG TERM GOAL #2   Title pt will have bilateral shoulder ROM WFL for normal performance of home activities    Time 6    Period Weeks    Status On-going      PT LONG TERM GOAL #3   Title Pt will have decreased right arm and chest pain by greater than 50%    Time 6    Period Weeks    Status Achieved      PT LONG TERM GOAL #4   Title Pt will be fit for compression sleeve to for precautionary regions and can don/doff independently    Time 4    Period Weeks    Status Achieved      PT LONG TERM GOAL #5   Title pt will have improvement in quick dash by atleast 20 points to demonstrate improved function    Baseline 65%    Time 6    Period Weeks    Status On-going                 Plan - 02/24/21 0936    Clinical Impression Statement Good reduction in thumb and wrist edema after wrapping last visit.  No real tenderness today at pecs or axillary region so held [facility].  Pt indicated that she feels weak so initiated light theraband exercises today.  Encouraged pt to bring wraps next time so I can show her wrapping techniques.  Pt is making good progress toward goals and notes pain atleast 50% better.  Personal Factors and Comorbidities Comorbidity 3+     Comorbidities reoccurence of left breast CA, prior radiation, prior chemo, on disability for back pain, LN removed bilaterally    Examination-Activity Limitations Bathing;Carry;Dressing;Lift;Stand;Sleep;Reach Overhead    Stability/Clinical Decision Making Stable/Uncomplicated    Rehab Potential Good    PT Frequency 2x / week    PT Duration 6 weeks    PT Treatment/Interventions Therapeutic exercise;Patient/family education;Manual techniques;Passive range of motion;Scar mobilization;Compression bandaging;Manual lymph drainage    PT Next Visit Plan instruct hand/wrist wrapping if she brings wraps, MFR to cording, bilateral chest, STM pecs prn., MLD prn    PT Home Exercise Plan 4 post op exercises;to try supine flex and stargazer, supine wand flex and scap    Consulted and Agree with Plan of Care Patient           Patient will benefit from skilled therapeutic intervention in order to improve the following deficits and impairments:  Decreased knowledge of precautions,Decreased range of motion,Decreased scar mobility,Decreased strength,Increased edema,Increased fascial restricitons,Impaired sensation,Postural dysfunction,Impaired UE functional use,Pain  Visit Diagnosis: Aftercare following surgery for neoplasm  Abnormal posture  Localized edema  Stiffness of right shoulder, not elsewhere classified  Stiffness of left shoulder, not elsewhere classified  Carcinoma in situ of left breast, unspecified type     Problem List Patient Active Problem List   Diagnosis Date Noted  . Neoplasm of left breast, primary tumor staging category Tis: ductal carcinoma in situ (DCIS) 11/25/2020  . Genetic testing 10/14/2020  . Family history of breast cancer   . Personal history of malignant neoplasm of breast   . Ductal carcinoma in situ of breast   . 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    Claris Pong 02/24/2021, 9:59 AM  [place], [place], [address] Phone: [phone]   Fax:  [phone]  Name: Shantea Poulton MRN: [id number] Date of Birth: Sep 06, 1956 Cheral Almas, PT 02/24/21 10:00 AM

## 2021-02-24 NOTE — Patient Instructions (Addendum)
Over Head Pull: Narrow and Wide Grip   Cancer Rehab [phone]   On back, knees bent, feet flat, band across thighs, elbows straight but relaxed. Pull hands apart (start). Keeping elbows straight, bring arms up and over head, hands toward floor. Keep pull steady on band. Hold momentarily. Return slowly, keeping pull steady, back to start. Then do same with a wider grip on the band (past shoulder width) Repeat _5-10__ times. Band color __yellow____   Side Pull: Double Arm   On back, knees bent, feet flat. Arms perpendicular to body, shoulder level, elbows straight but relaxed. Pull arms out to sides, elbows straight. Resistance band comes across collarbones, hands toward floor. Hold momentarily. Slowly return to starting position. Repeat _5-10__ times. Band color _yellow____     Shoulder Rotation: Double Arm   On back, knees bent, feet flat, elbows tucked at sides, bent 90, hands palms up. Pull hands apart and down toward floor, keeping elbows near sides. Hold momentarily. Slowly return to starting position. Repeat _5-10__ times. Band color __yellow____   Access Code: [email or web address] URL: [email or web address] Date: 02/24/2021 Prepared by: Cheral Almas  Exercises Scapular Retraction with Resistance - 1 x daily - 3 x weekly - 1 sets - 10 reps Standing Bicep Curls with Resistance - 1 x daily - 3 x weekly - 1 sets - 10 reps

## 2021-03-01 ENCOUNTER — Other Ambulatory Visit: Payer: Self-pay

## 2021-03-01 ENCOUNTER — Ambulatory Visit: Payer: 59

## 2021-03-01 DIAGNOSIS — Z483 Aftercare following surgery for neoplasm: Secondary | ICD-10-CM

## 2021-03-01 DIAGNOSIS — R600 Localized edema: Secondary | ICD-10-CM

## 2021-03-01 DIAGNOSIS — M25611 Stiffness of right shoulder, not elsewhere classified: Secondary | ICD-10-CM

## 2021-03-01 DIAGNOSIS — R293 Abnormal posture: Secondary | ICD-10-CM

## 2021-03-01 DIAGNOSIS — M25612 Stiffness of left shoulder, not elsewhere classified: Secondary | ICD-10-CM

## 2021-03-01 DIAGNOSIS — D0592 Unspecified type of carcinoma in situ of left breast: Secondary | ICD-10-CM

## 2021-03-01 NOTE — Patient Instructions (Signed)

## 2021-03-01 NOTE — Therapy (Signed)
[place], [place], [address] Phone: [phone]   Fax:  [phone]  Physical Therapy Treatment  Patient Details  Name: Candice Maldonado MRN: [id number] Date of Birth: 1956/07/09 Referring Provider (PT): Dr. Georgette Dover   Encounter Date: 03/01/2021   PT End of Session - 03/01/21 1204    Visit Number 8    Number of Visits 12    Date for PT Re-Evaluation 03/14/21    PT Start Time 0900    PT Stop Time 1001    PT Time Calculation (min) 61 min    Activity Tolerance Patient tolerated treatment well    Behavior During Therapy [facility] for tasks assessed/performed           Past Medical History:  Diagnosis Date  . Anemia    had blood transfusion  . Arthritis   . Breast cancer ([place]) 2000  . Colon polyps 2015  . Depression   . Diabetes ([place])    type 2  - diet controlled   . Ductal carcinoma in situ of breast   . Family history of breast cancer   . Gallstones   . GERD (gastroesophageal reflux disease)   . HLD (hyperlipidemia)   . Localized swelling of both lower legs    on HCTZ  . 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 Obesity   . Personal history of chemotherapy   . Personal history of malignant neoplasm of breast   . Personal history of radiation therapy     Past Surgical History:  Procedure Laterality Date  . BACK SURGERY  2011  . BREAST LUMPECTOMY    . CHOLECYSTECTOMY  1980  . EVACUATION BREAST HEMATOMA Bilateral 11/26/2020   Procedure: EVACUATION POST-OP CHEST WALL HEMATOMA;  Surgeon: Donnie Mesa, MD;  Location: [facility];  Service: General;  Laterality: Bilateral;  . LEFT MASTECTOMY AND RIGHT PROPHYLACTIC MASTECTOMY (Bilateral Breast)  11/25/2020  . TOTAL MASTECTOMY Bilateral 11/25/2020   Procedure: LEFT MASTECTOMY AND RIGHT PROPHYLACTIC MASTECTOMY;  Surgeon: Donnie Mesa, MD;  Location: [facility];  Service: General;  Laterality: Bilateral;  LMA, PEC BLOCK; START TIME OF  7:30 AM FOR 120 MINUTES TSUEI IQ  . TUBAL LIGATION  1990    There were no vitals filed for this visit.   Subjective Assessment - 03/01/21 0858    Subjective Swelling is back up again, but I forgot my wraps today for you to show me. I put the pink binder over the breast jovi pak to make it tighter,  I can't tell if there is any difference but it does feel softer at the top    Pertinent History Pt has a history of prior left breast CA with lumpectomy and extensive ALND of she believes 11 LN in 2000. She had radiation and chemotherapy at that time.  She had recent bilateral mastectomies with right being prophylactic.  Surgery note does not note right LN removal, however [name] note says 0/6 LN removed on right.  On 11/26/2020 pt had to have another surgery for evacuation of chest wall hematoma.  Her drains were removed on December 10, 2020.  Pt is disabled due to back pain.    How long can you stand comfortably? not long secondary to back pain    How long can you walk comfortably? not long secondary to back pain    Patient Stated Goals Decrease right arm pain/chest tightness    Currently in Pain? Yes  Pain Score 6     Pain Location Back    Pain Orientation Left    Pain Descriptors / Indicators Aching    Pain Type Chronic pain    Pain Onset More than a month ago    Pain Frequency Intermittent                             [facility] Adult PT Treatment/Exercise - 03/01/21 0001      Manual Therapy   Myofascial Release to right areas of cording, Bilateral chest region    Compression Bandaging Pt was instructed in compression bandaging to fingers, hand and arm and did very well overall.  she was given illustrated and written instructins    Passive ROM Bilateral shoulder flex, scaption, abd, d 2 flex                       PT Long Term Goals - 02/24/21 0911      PT LONG TERM GOAL #1   Title pt will be independent and compliant with HEP to improve ROM and strength  of bilateral shoulders    Time 4    Period Weeks    Status Achieved      PT LONG TERM GOAL #2   Title pt will have bilateral shoulder ROM WFL for normal performance of home activities    Time 6    Period Weeks    Status On-going      PT LONG TERM GOAL #3   Title Pt will have decreased right arm and chest pain by greater than 50%    Time 6    Period Weeks    Status Achieved      PT LONG TERM GOAL #4   Title Pt will be fit for compression sleeve to for precautionary regions and can don/doff independently    Time 4    Period Weeks    Status Achieved      PT LONG TERM GOAL #5   Title pt will have improvement in quick dash by atleast 20 points to demonstrate improved function    Baseline 65%    Time 6    Period Weeks    Status On-going                 Plan - 03/01/21 1205    Clinical Impression Statement Pt came in reporting continued swelling in the hand and wrist area.Pt was instructed in compression bandaging to the right fingers, hand, wrist and arm.  She practiced each 1- 2 x's using the table for assist.  She did exceptionally well and was able to return demonstrate good technique.  Continued MFR and PROM to bilateral shoulders with pt making good progress.  Shest are has softened some proximally but continues with firmness distal to incision.  She has started wrearing her pink binder over the chest jovi pad.    Personal Factors and Comorbidities Comorbidity 3+    Comorbidities reoccurence of left breast CA, prior radiation, prior chemo, on disability for back pain, LN removed bilaterally    Examination-Activity Limitations Bathing;Carry;Dressing;Lift;Stand;Sleep;Reach Overhead    Stability/Clinical Decision Making Stable/Uncomplicated    Rehab Potential Good    PT Frequency 2x / week    PT Duration 6 weeks    PT Treatment/Interventions Therapeutic exercise;Patient/family education;Manual techniques;Passive range of motion;Scar mobilization;Compression  bandaging;Manual lymph drainage    PT Next Visit Plan review finger/hand/UE wrap prn, Cont  STM, MFR, PROM, and MLD prn    PT Home Exercise Plan 4 post op exercises;to try supine flex and stargazer, supine wand flex and scap    Consulted and Agree with Plan of Care Patient           Patient will benefit from skilled therapeutic intervention in order to improve the following deficits and impairments:  Decreased knowledge of precautions,Decreased range of motion,Decreased scar mobility,Decreased strength,Increased edema,Increased fascial restricitons,Impaired sensation,Postural dysfunction,Impaired UE functional use,Pain  Visit Diagnosis: Aftercare following surgery for neoplasm  Abnormal posture  Localized edema  Stiffness of right shoulder, not elsewhere classified  Stiffness of left shoulder, not elsewhere classified  Carcinoma in situ of left breast, unspecified type     Problem List Patient Active Problem List   Diagnosis Date Noted  . Neoplasm of left breast, primary tumor staging category Tis: ductal carcinoma in situ (DCIS) 11/25/2020  . Genetic testing 10/14/2020  . Family history of breast cancer   . Personal history of malignant neoplasm of breast   . Ductal carcinoma in situ of breast   . 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    Claris Pong 03/01/2021, 12:11 PM  [place], [place], [address] Phone: [phone]   Fax:  [phone]  Name: Candice Maldonado MRN: [id number] Date of Birth: [birth year] Cheral Almas, PT 03/01/21 12:11 PM

## 2021-03-03 ENCOUNTER — Other Ambulatory Visit: Payer: Self-pay

## 2021-03-03 ENCOUNTER — Ambulatory Visit: Payer: 59

## 2021-03-03 DIAGNOSIS — M25611 Stiffness of right shoulder, not elsewhere classified: Secondary | ICD-10-CM

## 2021-03-03 DIAGNOSIS — M25612 Stiffness of left shoulder, not elsewhere classified: Secondary | ICD-10-CM

## 2021-03-03 DIAGNOSIS — R293 Abnormal posture: Secondary | ICD-10-CM

## 2021-03-03 DIAGNOSIS — D0592 Unspecified type of carcinoma in situ of left breast: Secondary | ICD-10-CM

## 2021-03-03 DIAGNOSIS — Z483 Aftercare following surgery for neoplasm: Secondary | ICD-10-CM | POA: Diagnosis not present

## 2021-03-03 DIAGNOSIS — R600 Localized edema: Secondary | ICD-10-CM

## 2021-03-03 NOTE — Therapy (Signed)
[place], [place], [address] Phone: [phone]   Fax:  [phone]  Physical Therapy Treatment  Patient Details  Name: Candice Maldonado MRN: [id number] Date of Birth: 1956-04-10 Referring Provider (PT): Dr. Georgette Dover   Encounter Date: 03/03/2021   PT End of Session - 03/03/21 1208    Visit Number 9    Number of Visits 12    Date for PT Re-Evaluation 03/14/21    PT Start Time 0905    PT Stop Time 0955    PT Time Calculation (min) 50 min    Activity Tolerance Patient tolerated treatment well    Behavior During Therapy [facility] for tasks assessed/performed           Past Medical History:  Diagnosis Date  . Anemia    had blood transfusion  . Arthritis   . Breast cancer ([place]) 2000  . Colon polyps 2015  . Depression   . Diabetes ([place])    type 2  - diet controlled   . Ductal carcinoma in situ of breast   . Family history of breast cancer   . Gallstones   . GERD (gastroesophageal reflux disease)   . HLD (hyperlipidemia)   . Localized swelling of both lower legs    on HCTZ  . 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 Obesity   . Personal history of chemotherapy   . Personal history of malignant neoplasm of breast   . Personal history of radiation therapy     Past Surgical History:  Procedure Laterality Date  . BACK SURGERY  2011  . BREAST LUMPECTOMY    . CHOLECYSTECTOMY  1980  . EVACUATION BREAST HEMATOMA Bilateral 11/26/2020   Procedure: EVACUATION POST-OP CHEST WALL HEMATOMA;  Surgeon: Donnie Mesa, MD;  Location: [facility];  Service: General;  Laterality: Bilateral;  . LEFT MASTECTOMY AND RIGHT PROPHYLACTIC MASTECTOMY (Bilateral Breast)  11/25/2020  . TOTAL MASTECTOMY Bilateral 11/25/2020   Procedure: LEFT MASTECTOMY AND RIGHT PROPHYLACTIC MASTECTOMY;  Surgeon: Donnie Mesa, MD;  Location: [facility];  Service: General;  Laterality: Bilateral;  LMA, PEC BLOCK; START TIME OF  7:30 AM FOR 120 MINUTES TSUEI IQ  . TUBAL LIGATION  1990    There were no vitals filed for this visit.   Subjective Assessment - 03/03/21 0905    Subjective I tried practicing the wrapping yesterday and took it off this am. My swelling is down today so I must have done it right. I got my prostheses and a compression bra yesterday and it feels so good.    Pertinent History Pt has a history of prior left breast CA with lumpectomy and extensive ALND of she believes 11 LN in 2000. She had radiation and chemotherapy at that time.  She had recent bilateral mastectomies with right being prophylactic.  Surgery note does not note right LN removal, however [name] note says 0/6 LN removed on right.  On 11/26/2020 pt had to have another surgery for evacuation of chest wall hematoma.  Her drains were removed on December 10, 2020.  Pt is disabled due to back pain.    How long can you walk comfortably? not long secondary to back pain    Patient Stated Goals Decrease right arm pain/chest tightness    Currently in Pain? Yes    Pain Score 9     Pain Location Back    Pain Orientation Left    Pain Descriptors / Indicators  Aching    Pain Onset More than a month ago    Pain Descriptors / Indicators Aching;Tightness    Pain Onset More than a month ago                             [facility] Adult PT Treatment/Exercise - 03/03/21 0001      Manual Therapy   Soft tissue mobilization scar mobilization bilaterally    Myofascial Release to right areas of cording, Bilateral chest region    Compression Bandaging Reviewed compression bandaging to fingers, hand and arm and did very well    Passive ROM Bilateral shoulder flex, scaption, abd, d 2 flex                       PT Long Term Goals - 02/24/21 0911      PT LONG TERM GOAL #1   Title pt will be independent and compliant with HEP to improve ROM and strength of bilateral shoulders    Time 4    Period Weeks    Status Achieved       PT LONG TERM GOAL #2   Title pt will have bilateral shoulder ROM WFL for normal performance of home activities    Time 6    Period Weeks    Status On-going      PT LONG TERM GOAL #3   Title Pt will have decreased right arm and chest pain by greater than 50%    Time 6    Period Weeks    Status Achieved      PT LONG TERM GOAL #4   Title Pt will be fit for compression sleeve to for precautionary regions and can don/doff independently    Time 4    Period Weeks    Status Achieved      PT LONG TERM GOAL #5   Title pt will have improvement in quick dash by atleast 20 points to demonstrate improved function    Baseline 65%    Time 6    Period Weeks    Status On-going                 Plan - 03/03/21 1209    Clinical Impression Statement Pt was thrilled to be fit for her prosthesis and she has purchased several compression bras which feel comfortable for her.  We reviewed wrapping again and she did well but required occasion VC's to correct.  therapy continued with soft tissue mobilization to pecs and lats and PROM of bilateral shoulders.  Chest area is softening but has continued fibrosis distal to incisions.  Will try chip packs there next visit, and will try MLD to that area as well.    Personal Factors and Comorbidities Comorbidity 3+    Comorbidities reoccurence of left breast CA, prior radiation, prior chemo, on disability for back pain, LN removed bilaterally    Examination-Activity Limitations Bathing;Carry;Dressing;Lift;Stand;Sleep;Reach Overhead    Stability/Clinical Decision Making Stable/Uncomplicated    Rehab Potential Good    PT Frequency 2x / week    PT Duration 6 weeks    PT Treatment/Interventions Therapeutic exercise;Patient/family education;Manual techniques;Passive range of motion;Scar mobilization;Compression bandaging;Manual lymph drainage    PT Next Visit Plan chip packs for chest, MLD  chest to inguinal LN's, STM, PROM prn    PT Home Exercise Plan 4 post op  exercises;to try supine flex and stargazer, supine wand flex and scap  Consulted and Agree with Plan of Care Patient           Patient will benefit from skilled therapeutic intervention in order to improve the following deficits and impairments:  Decreased knowledge of precautions,Decreased range of motion,Decreased scar mobility,Decreased strength,Increased edema,Increased fascial restricitons,Impaired sensation,Postural dysfunction,Impaired UE functional use,Pain  Visit Diagnosis: Aftercare following surgery for neoplasm  Abnormal posture  Localized edema  Stiffness of right shoulder, not elsewhere classified  Stiffness of left shoulder, not elsewhere classified  Carcinoma in situ of left breast, unspecified type     Problem List Patient Active Problem List   Diagnosis Date Noted  . Neoplasm of left breast, primary tumor staging category Tis: ductal carcinoma in situ (DCIS) 11/25/2020  . Genetic testing 10/14/2020  . Family history of breast cancer   . Personal history of malignant neoplasm of breast   . Ductal carcinoma in situ of breast   . 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    Claris Pong 03/03/2021, 12:13 PM  [place], [place], [address] Phone: [phone]   Fax:  [phone]  Name: Candice Maldonado MRN: [id number] Date of Birth: 08-09-1956 Cheral Almas, PT 03/03/21 12:14 PM

## 2021-03-08 ENCOUNTER — Other Ambulatory Visit: Payer: Self-pay

## 2021-03-08 ENCOUNTER — Ambulatory Visit: Payer: 59 | Attending: Surgery

## 2021-03-08 DIAGNOSIS — D0592 Unspecified type of carcinoma in situ of left breast: Secondary | ICD-10-CM | POA: Diagnosis present

## 2021-03-08 DIAGNOSIS — R600 Localized edema: Secondary | ICD-10-CM | POA: Insufficient documentation

## 2021-03-08 DIAGNOSIS — Z483 Aftercare following surgery for neoplasm: Secondary | ICD-10-CM | POA: Diagnosis present

## 2021-03-08 DIAGNOSIS — M25612 Stiffness of left shoulder, not elsewhere classified: Secondary | ICD-10-CM | POA: Diagnosis present

## 2021-03-08 DIAGNOSIS — R293 Abnormal posture: Secondary | ICD-10-CM | POA: Insufficient documentation

## 2021-03-08 DIAGNOSIS — M25611 Stiffness of right shoulder, not elsewhere classified: Secondary | ICD-10-CM | POA: Diagnosis present

## 2021-03-08 NOTE — Therapy (Signed)
[place], [place], [address] Phone: [phone]   Fax:  [phone]  Physical Therapy Treatment  Patient Details  Name: Candice Maldonado MRN: [id number] Date of Birth: 05/18/1956 Referring Provider (PT): Dr. Georgette Dover   Encounter Date: 03/08/2021   PT End of Session - 03/08/21 1009    Visit Number 10    Number of Visits 12    Date for PT Re-Evaluation 03/14/21    PT Start Time 0914    PT Stop Time 0959    PT Time Calculation (min) 45 min    Activity Tolerance Patient tolerated treatment well    Behavior During Therapy [facility] for tasks assessed/performed           Past Medical History:  Diagnosis Date  . Anemia    had blood transfusion  . Arthritis   . Breast cancer ([place]) 2000  . Colon polyps 2015  . Depression   . Diabetes ([place])    type 2  - diet controlled   . Ductal carcinoma in situ of breast   . Family history of breast cancer   . Gallstones   . GERD (gastroesophageal reflux disease)   . HLD (hyperlipidemia)   . Localized swelling of both lower legs    on HCTZ  . 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 Obesity   . Personal history of chemotherapy   . Personal history of malignant neoplasm of breast   . Personal history of radiation therapy     Past Surgical History:  Procedure Laterality Date  . BACK SURGERY  2011  . BREAST LUMPECTOMY    . CHOLECYSTECTOMY  1980  . EVACUATION BREAST HEMATOMA Bilateral 11/26/2020   Procedure: EVACUATION POST-OP CHEST WALL HEMATOMA;  Surgeon: Donnie Mesa, MD;  Location: [facility];  Service: General;  Laterality: Bilateral;  . LEFT MASTECTOMY AND RIGHT PROPHYLACTIC MASTECTOMY (Bilateral Breast)  11/25/2020  . TOTAL MASTECTOMY Bilateral 11/25/2020   Procedure: LEFT MASTECTOMY AND RIGHT PROPHYLACTIC MASTECTOMY;  Surgeon: Donnie Mesa, MD;  Location: [facility];  Service: General;  Laterality: Bilateral;  LMA, PEC BLOCK; START TIME OF  7:30 AM FOR 120 MINUTES TSUEI IQ  . TUBAL LIGATION  1990    There were no vitals filed for this visit.   Subjective Assessment - 03/08/21 0915    Subjective I wrapped over the weekend and it did well.  Its a little swollen today.I have a little soreness at lateral trunk on both sides today.  Tender to touch.    Pertinent History Pt has a history of prior left breast CA with lumpectomy and extensive ALND of she believes 11 LN in 2000. She had radiation and chemotherapy at that time.  She had recent bilateral mastectomies with right being prophylactic.  Surgery note does not note right LN removal, however [name] note says 0/6 LN removed on right.  On 11/26/2020 pt had to have another surgery for evacuation of chest wall hematoma.  Her drains were removed on December 10, 2020.  Pt is disabled due to back pain.    How long can you stand comfortably? not long secondary to back pain    How long can you walk comfortably? not long secondary to back pain    Patient Stated Goals Decrease right arm pain/chest tightness    Currently in Pain? Yes    Pain Score 6     Pain Location Back    Pain Orientation Left  Pain Descriptors / Indicators Aching    Pain Type Chronic pain    Pain Onset More than a month ago    Pain Frequency Intermittent    Multiple Pain Sites No                             [facility] Adult PT Treatment/Exercise - 03/08/21 0001      Manual Therapy   Edema Management chip packs made for bilateral chest areas of fibrosis    Soft tissue mobilization scar mobilization bilaterally to mastectomy incisions    Myofascial Release to bilateral chest region    Manual Lymphatic Drainage (MLD) short neck, left axillary and left inguinal LN, left chest area of fibrosis directing to left inguinal area, and ending with LN repeated on right side of chest to right inguinal area multiple times and ended with LN's    Passive ROM Bilateral shoulder flex, scaption, abd, d 2 flex                        PT Long Term Goals - 02/24/21 0911      PT LONG TERM GOAL #1   Title pt will be independent and compliant with HEP to improve ROM and strength of bilateral shoulders    Time 4    Period Weeks    Status Achieved      PT LONG TERM GOAL #2   Title pt will have bilateral shoulder ROM WFL for normal performance of home activities    Time 6    Period Weeks    Status On-going      PT LONG TERM GOAL #3   Title Pt will have decreased right arm and chest pain by greater than 50%    Time 6    Period Weeks    Status Achieved      PT LONG TERM GOAL #4   Title Pt will be fit for compression sleeve to for precautionary regions and can don/doff independently    Time 4    Period Weeks    Status Achieved      PT LONG TERM GOAL #5   Title pt will have improvement in quick dash by atleast 20 points to demonstrate improved function    Baseline 65%    Time 6    Period Weeks    Status On-going                 Plan - 03/08/21 1010    Clinical Impression Statement Pt was 12 min late today.Chip packs were made for bilateral chest area of fibrosis however pt did not have her compression bra on and will try them when she gets home with her prairie hugger bra.  Initiated MLD to bilateral chest areas of fibrosis with mild softening noted.  Performed PROM on bilateral shoulders with left shoulder slightly tighter today than usual..    Personal Factors and Comorbidities Comorbidity 3+    Comorbidities reoccurence of left breast CA, prior radiation, prior chemo, on disability for back pain, LN removed bilaterally    Examination-Activity Limitations Bathing;Carry;Dressing;Lift;Stand;Sleep;Reach Overhead    Stability/Clinical Decision Making Stable/Uncomplicated    Rehab Potential Good    PT Frequency 2x / week    PT Duration 6 weeks    PT Treatment/Interventions Therapeutic exercise;Patient/family education;Manual techniques;Passive range of motion;Scar  mobilization;Compression bandaging;Manual lymph drainage    PT Next Visit Plan chip packs for chest, MLD  chest to inguinal LN's, STM, PROM prn    PT Home Exercise Plan 4 post op exercises;to try supine flex and stargazer, supine wand flex and scap    Consulted and Agree with Plan of Care Patient           Patient will benefit from skilled therapeutic intervention in order to improve the following deficits and impairments:  Decreased knowledge of precautions,Decreased range of motion,Decreased scar mobility,Decreased strength,Increased edema,Increased fascial restricitons,Impaired sensation,Postural dysfunction,Impaired UE functional use,Pain  Visit Diagnosis: Aftercare following surgery for neoplasm  Abnormal posture  Localized edema  Stiffness of right shoulder, not elsewhere classified  Stiffness of left shoulder, not elsewhere classified  Carcinoma in situ of left breast, unspecified type     Problem List Patient Active Problem List   Diagnosis Date Noted  . Neoplasm of left breast, primary tumor staging category Tis: ductal carcinoma in situ (DCIS) 11/25/2020  . Genetic testing 10/14/2020  . Family history of breast cancer   . Personal history of malignant neoplasm of breast   . Ductal carcinoma in situ of breast   . 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    Candice Maldonado 03/08/2021, 10:14 AM  [place], [place], [address] Phone: [phone]   Fax:  [phone]  Name: Candice Maldonado MRN: [id number] Date of Birth: 02/02/1956 Cheral Almas, PT 03/08/21 10:15 AM

## 2021-03-10 ENCOUNTER — Ambulatory Visit: Payer: 59 | Attending: Surgery

## 2021-03-10 ENCOUNTER — Other Ambulatory Visit: Payer: Self-pay

## 2021-03-10 DIAGNOSIS — Z483 Aftercare following surgery for neoplasm: Secondary | ICD-10-CM

## 2021-03-10 DIAGNOSIS — M25611 Stiffness of right shoulder, not elsewhere classified: Secondary | ICD-10-CM

## 2021-03-10 DIAGNOSIS — R600 Localized edema: Secondary | ICD-10-CM | POA: Diagnosis present

## 2021-03-10 DIAGNOSIS — R293 Abnormal posture: Secondary | ICD-10-CM | POA: Diagnosis present

## 2021-03-10 DIAGNOSIS — M25612 Stiffness of left shoulder, not elsewhere classified: Secondary | ICD-10-CM | POA: Diagnosis present

## 2021-03-10 DIAGNOSIS — D0592 Unspecified type of carcinoma in situ of left breast: Secondary | ICD-10-CM | POA: Diagnosis present

## 2021-03-10 NOTE — Therapy (Signed)
[place], [place], [address] Phone: [phone]   Fax:  [phone]  Physical Therapy Treatment  Patient Details  Name: Candice Maldonado MRN: [id number] Date of Birth: 29-Sep-1956 Referring Provider (PT): Dr. Georgette Dover   Encounter Date: 03/10/2021   PT End of Session - 03/10/21 1010    Visit Number 11    Number of Visits 12    Date for PT Re-Evaluation 03/14/21    PT Start Time 2119    PT Stop Time 1003    PT Time Calculation (min) 66 min    Activity Tolerance Patient tolerated treatment well    Behavior During Therapy [facility] for tasks assessed/performed           Past Medical History:  Diagnosis Date  . Anemia    had blood transfusion  . Arthritis   . Breast cancer ([place]) 2000  . Colon polyps 2015  . Depression   . Diabetes ([place])    type 2  - diet controlled   . Ductal carcinoma in situ of breast   . Family history of breast cancer   . Gallstones   . GERD (gastroesophageal reflux disease)   . HLD (hyperlipidemia)   . Localized swelling of both lower legs    on HCTZ  . 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 Obesity   . Personal history of chemotherapy   . Personal history of malignant neoplasm of breast   . Personal history of radiation therapy     Past Surgical History:  Procedure Laterality Date  . BACK SURGERY  2011  . BREAST LUMPECTOMY    . CHOLECYSTECTOMY  1980  . EVACUATION BREAST HEMATOMA Bilateral 11/26/2020   Procedure: EVACUATION POST-OP CHEST WALL HEMATOMA;  Surgeon: Donnie Mesa, MD;  Location: [place];  Service: General;  Laterality: Bilateral;  . LEFT MASTECTOMY AND RIGHT PROPHYLACTIC MASTECTOMY (Bilateral Breast)  11/25/2020  . TOTAL MASTECTOMY Bilateral 11/25/2020   Procedure: LEFT MASTECTOMY AND RIGHT PROPHYLACTIC MASTECTOMY;  Surgeon: Donnie Mesa, MD;  Location: [place];  Service: General;  Laterality: Bilateral;  LMA, PEC BLOCK; START TIME OF  7:30 AM FOR 120 MINUTES TSUEI IQ  . TUBAL LIGATION  1990    There were no vitals filed for this visit.   Subjective Assessment - 03/10/21 0857    Subjective I slept in the compression bra with the chip packs last night..  I think they shifted a little. Swelling has been pretty good. I have not had to wrap right hand/arm   Pertinent History Pt has a history of prior left breast CA with lumpectomy and extensive ALND of she believes 11 LN in 2000. She had radiation and chemotherapy at that time.  She had recent bilateral mastectomies with right being prophylactic.  Surgery note does not note right LN removal, however [name] note says 0/6 LN removed on right.  On 11/26/2020 pt had to have another surgery for evacuation of chest wall hematoma.  Her drains were removed on December 10, 2020.  Pt is disabled due to back pain.    How long can you stand comfortably? not long secondary to back pain    How long can you walk comfortably? not long secondary to back pain    Patient Stated Goals Decrease right arm pain/chest tightness    Currently in Pain? Yes    Pain Score 9     Pain Location Back    Pain Orientation  Left    Pain Descriptors / Indicators Aching    Pain Type Chronic pain    Pain Onset More than a month ago    Pain Frequency Intermittent    Multiple Pain Sites No                             [facility] Adult PT Treatment/Exercise - 03/10/21 0001      Shoulder Exercises: Supine   Other Supine Exercises AAflex and scaption with wand, stargazer x 5      Manual Therapy   Edema Management pt assisted putting chip packs in bra    Soft tissue mobilization scar mobilization bilaterally to incisions    Myofascial Release to bilateral chest region    Manual Lymphatic Drainage (MLD) short neck, left inguinal LN, left chest area of fibrosis directing to left inguinal area, and ending with LN repeated on right side of chest to right inguinal area multiple times and ended with LN's.   Pt was instructed in all and practiced all steps    Passive ROM Bilateral shoulder flex, scaption, abd, d 2 flex                       PT Long Term Goals - 02/24/21 0911      PT LONG TERM GOAL #1   Title pt will be independent and compliant with HEP to improve ROM and strength of bilateral shoulders    Time 4    Period Weeks    Status Achieved      PT LONG TERM GOAL #2   Title pt will have bilateral shoulder ROM WFL for normal performance of home activities    Time 6    Period Weeks    Status On-going      PT LONG TERM GOAL #3   Title Pt will have decreased right arm and chest pain by greater than 50%    Time 6    Period Weeks    Status Achieved      PT LONG TERM GOAL #4   Title Pt will be fit for compression sleeve to for precautionary regions and can don/doff independently    Time 4    Period Weeks    Status Achieved      PT LONG TERM GOAL #5   Title pt will have improvement in quick dash by atleast 20 points to demonstrate improved function    Baseline 65%    Time 6    Period Weeks    Status On-going                 Plan - 03/10/21 1011    Clinical Impression Statement Pt had chip packs in bra but they had shifted and were not over areas of fibrosis so no noticeable difference noted today.  Pt was instructed in MLD directing to inguinal LN and performing over areas of chest fibrosis.  She did well but required VC's and TC's for proper stretch.  She continues to have cording greatest on the right that runs down to the elbow that have not released but have stretched to where it is not that uncomfortable for her.   1 or 2 very small axillary cords on the left.  continued scar work/MFR techniques and PROM.  Pt will be in [place] next week.  Advised not to get her arms into hot tubs    Personal Factors and Comorbidities Comorbidity  3+    Comorbidities reoccurence of left breast CA, prior radiation, prior chemo, on disability for back pain, LN removed  bilaterally    Examination-Activity Limitations Bathing;Carry;Dressing;Lift;Stand;Sleep;Reach Overhead    Stability/Clinical Decision Making Stable/Uncomplicated    Rehab Potential Good    PT Frequency 2x / week    PT Duration 6 weeks    PT Treatment/Interventions Therapeutic exercise;Patient/family education;Manual techniques;Passive range of motion;Scar mobilization;Compression bandaging;Manual lymph drainage    PT Next Visit Plan Recert next, check goals,chip packs for chest, MLD  chest to inguinal LN's, STM, PROM prn    PT Home Exercise Plan 4 post op exercises;to try supine flex and stargazer, supine wand flex and scap    Consulted and Agree with Plan of Care Patient           Patient will benefit from skilled therapeutic intervention in order to improve the following deficits and impairments:  Decreased knowledge of precautions,Decreased range of motion,Decreased scar mobility,Decreased strength,Increased edema,Increased fascial restricitons,Impaired sensation,Postural dysfunction,Impaired UE functional use,Pain  Visit Diagnosis: Aftercare following surgery for neoplasm  Abnormal posture  Localized edema  Stiffness of right shoulder, not elsewhere classified  Stiffness of left shoulder, not elsewhere classified  Carcinoma in situ of left breast, unspecified type     Problem List Patient Active Problem List   Diagnosis Date Noted  . Neoplasm of left breast, primary tumor staging category Tis: ductal carcinoma in situ (DCIS) 11/25/2020  . Genetic testing 10/14/2020  . Family history of breast cancer   . Personal history of malignant neoplasm of breast   . Ductal carcinoma in situ of breast   . 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    Claris Pong 03/10/2021, 10:17 AM  [facility] [place], [place], [address] Phone: [phone]   Fax:  [phone]  Name: Candice Maldonado MRN: [id number] Date of Birth: 07/13/1956  Cheral Almas, PT 03/10/21 10:19 AM

## 2021-03-22 ENCOUNTER — Other Ambulatory Visit: Payer: Self-pay

## 2021-03-22 ENCOUNTER — Ambulatory Visit: Payer: 59

## 2021-03-22 DIAGNOSIS — Z483 Aftercare following surgery for neoplasm: Secondary | ICD-10-CM | POA: Diagnosis not present

## 2021-03-22 DIAGNOSIS — D0592 Unspecified type of carcinoma in situ of left breast: Secondary | ICD-10-CM

## 2021-03-22 DIAGNOSIS — M25612 Stiffness of left shoulder, not elsewhere classified: Secondary | ICD-10-CM

## 2021-03-22 DIAGNOSIS — R293 Abnormal posture: Secondary | ICD-10-CM

## 2021-03-22 DIAGNOSIS — M25611 Stiffness of right shoulder, not elsewhere classified: Secondary | ICD-10-CM

## 2021-03-22 DIAGNOSIS — R600 Localized edema: Secondary | ICD-10-CM

## 2021-03-22 NOTE — Therapy (Signed)
[place], [place], [address] Phone: [phone]   Fax:  [phone]  Physical Therapy Treatment  Patient Details  Name: Candice Maldonado MRN: [id number] Date of Birth: 10-30-1955 Referring Provider (PT): Dr. Georgette Dover   Encounter Date: 03/22/2021   PT End of Session - 03/22/21 1354     Visit Number 12    Number of Visits 20    Date for PT Re-Evaluation 04/19/21    PT Start Time 7253    PT Stop Time 1400    PT Time Calculation (min) 46 min    Activity Tolerance Patient tolerated treatment well    Behavior During Therapy [facility] for tasks assessed/performed             Past Medical History:  Diagnosis Date   Anemia    had blood transfusion   Arthritis    Breast cancer ([place]) 2000   Colon polyps 2015   Depression    Diabetes ([place])    type 2  - diet controlled    Ductal carcinoma in situ of breast    Family history of breast cancer    Gallstones    GERD (gastroesophageal reflux disease)    HLD (hyperlipidemia)    Localized swelling of both lower legs    on HCTZ   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    Obesity    Personal history of chemotherapy    Personal history of malignant neoplasm of breast    Personal history of radiation therapy     Past Surgical History:  Procedure Laterality Date   BACK SURGERY  2011   BREAST LUMPECTOMY     CHOLECYSTECTOMY  1980   EVACUATION BREAST HEMATOMA Bilateral 11/26/2020   Procedure: EVACUATION POST-OP CHEST WALL HEMATOMA;  Surgeon: Donnie Mesa, MD;  Location: [place];  Service: General;  Laterality: Bilateral;   LEFT MASTECTOMY AND RIGHT PROPHYLACTIC MASTECTOMY (Bilateral Breast)  11/25/2020   TOTAL MASTECTOMY Bilateral 11/25/2020   Procedure: LEFT MASTECTOMY AND RIGHT PROPHYLACTIC MASTECTOMY;  Surgeon: Donnie Mesa, MD;  Location: [place] [facility];  Service: General;  Laterality: Bilateral;  LMA, PEC BLOCK; START TIME OF 7:30 AM FOR 120  MINUTES TSUEI IQ   TUBAL LIGATION  1990    There were no vitals filed for this visit.   Subjective Assessment - 03/22/21 1316     Subjective Just got back from [place] but I didn't get to do much exercise. Swelling in the hand is doing better and I didn't have to wrap my arm at all.  I wore the glove by itself more than the sleeve . The chest swelling on the right side seems a little worse and the left chest because I didn't have the compression bra on. 15 min late    Pertinent History Pt has a history of prior left breast CA with lumpectomy and extensive ALND of she believes 11 LN in 2000. She had radiation and chemotherapy at that time.  She had recent bilateral mastectomies with right being prophylactic.  Surgery note does not note right LN removal, however [name] note says 0/6 LN removed on right.  On 11/26/2020 pt had to have another surgery for evacuation of chest wall hematoma.  Her drains were removed on December 10, 2020.  Pt is disabled due to back pain.    How long can you stand comfortably? not long secondary to back pain    How long can you walk comfortably? not  long secondary to back pain    Patient Stated Goals Decrease right arm pain/chest tightness    Currently in Pain? Yes    Pain Score 9     Pain Location Hip    Pain Orientation Left    Pain Descriptors / Indicators Aching    Pain Type Chronic pain    Pain Onset More than a month ago    Pain Frequency Intermittent                [facility] PT Assessment - 03/22/21 0001       Assessment   Medical Diagnosis Bilateral mastectomy    Referring Provider (PT) Dr. Georgette Dover    Onset Date/Surgical Date 11/25/20    Hand Dominance Right      Precautions   Precaution Comments Lymphedema risk      Prior Function   Level of Independence Independent      Observation/Other Assessments   Observations firmness noted right chest below incision and left chest above incision with left chest firmer and larger today..      AROM    Right Shoulder Extension 50 Degrees   right shoulder pain   Right Shoulder Flexion 127 Degrees   chronic right shoulder pain   Right Shoulder ABduction 105 Degrees   chronic right shoulder pain   Left Shoulder Extension 53 Degrees    Left Shoulder Flexion 154 Degrees    Left Shoulder ABduction 155 Degrees    Left Shoulder External Rotation 78 Degrees               LYMPHEDEMA/ONCOLOGY QUESTIONNAIRE - 03/22/21 0001       Surgeries   Mastectomy Date 11/25/20      Right Upper Extremity Lymphedema   15 cm Proximal to Olecranon Process 34.8 cm    10 cm Proximal to Olecranon Process 34.8 cm    Olecranon Process 27 cm    15 cm Proximal to Ulnar Styloid Process 24.6 cm    10 cm Proximal to Ulnar Styloid Process 21.1 cm    Just Proximal to Ulnar Styloid Process 17.4 cm    At Base of 2nd Digit 6.9 cm      Left Upper Extremity Lymphedema   15 cm Proximal to Olecranon Process 35.4 cm    10 cm Proximal to Olecranon Process 35.8 cm    Olecranon Process 27.2 cm    15 cm Proximal to Ulnar Styloid Process 23.6 cm    10 cm Proximal to Ulnar Styloid Process 20.1 cm    Just Proximal to Ulnar Styloid Process 16.7 cm    At Base of 2nd Digit 6.4 cm                Quick Dash - 03/22/21 0001     Open a tight or new jar Severe difficulty    Do heavy household chores (wash walls, wash floors) Moderate difficulty    Carry a shopping bag or briefcase Mild difficulty    Wash your back Mild difficulty    Use a knife to cut food No difficulty    Recreational activities in which you take some force or impact through your arm, shoulder, or hand (golf, hammering, tennis) Moderate difficulty    During the past week, to what extent has your arm, shoulder or hand problem interfered with your normal social activities with family, friends, neighbors, or groups? Modererately    During the past week, to what extent has your arm, shoulder or hand problem limited  your work or other regular daily  activities Not at all    Arm, shoulder, or hand pain. Moderate    Tingling (pins and needles) in your arm, shoulder, or hand None    Difficulty Sleeping No difficulty    DASH Score 29.55 %                    [facility] Adult PT Treatment/Exercise - 03/22/21 0001       Shoulder Exercises: Supine   Other Supine Exercises AAflex and scaption with wand, stargazer x 3                         PT Long Term Goals - 03/22/21 1349       PT LONG TERM GOAL #1   Title pt will be independent and compliant with HEP to improve ROM and strength of bilateral shoulders    Time 4    Period Weeks    Status Achieved      PT LONG TERM GOAL #2   Title pt will have bilateral shoulder ROM WFL for normal performance of home activities    Baseline left WFL, right improved but still limited by pain    Time 6    Period Weeks    Status On-going    Target Date 04/19/21      PT LONG TERM GOAL #3   Title Pt will have decreased right arm and chest pain by greater than 50%    Time 6    Period Weeks    Status Achieved      PT LONG TERM GOAL #4   Title Pt will be fit for compression sleeve to for precautionary regions and can don/doff independently    Time 4    Period Weeks    Status Achieved      PT LONG TERM GOAL #5   Title pt will have improvement in quick dash by atleast 20 points to demonstrate improved function    Baseline 65% at eval, 30 % today    Time 6    Period Weeks    Status Achieved      Additional Long Term Goals   Additional Long Term Goals Yes      PT LONG TERM GOAL #6   Title Pt will be independent with MLD and use of chip packs to decrease chest edema    Time 4    Period Weeks    Status New    Target Date 04/19/21                   Plan - 03/22/21 1355     Clinical Impression Statement Pt returned after a week and a half hiatus secondary to a trip to [place].  Pt was reassessed today for recertification.  Left shoulder ROM is now very  functional, and right shoulder is still limited but improved from evaluation secondary to shoulder pain. Right forearm and fingers still slightly larger than left.  Pt has been compliant with sleeve and glove wear but not as much while on vacation last weak.  Left chest above incision is still very hard and appears more swollen today and right chest below incision is still very firm but improved.  Pt requires review of MLD and scar massage for the chest region and right UE, Right shoulder ROM and bilateral strengthening    Personal Factors and Comorbidities Comorbidity 3+    Comorbidities reoccurence of left breast  CA, prior radiation, prior chemo, on disability for back pain, LN removed bilaterally    Examination-Activity Limitations Bathing;Carry;Dressing;Lift;Stand;Sleep;Reach Overhead    Stability/Clinical Decision Making Stable/Uncomplicated    Rehab Potential Good    PT Frequency 2x / week    PT Duration 6 weeks    PT Treatment/Interventions Therapeutic exercise;Patient/family education;Manual techniques;Passive range of motion;Scar mobilization;Compression bandaging;Manual lymph drainage    PT Next Visit Plan chip packs for chest, MLD  chest to inguinal LN's, STM, PROM prn    PT Home Exercise Plan 4 post op exercises;to try supine flex and stargazer, supine wand flex and scap    Consulted and Agree with Plan of Care Patient             Patient will benefit from skilled therapeutic intervention in order to improve the following deficits and impairments:  Decreased knowledge of precautions, Decreased range of motion, Decreased scar mobility, Decreased strength, Increased edema, Increased fascial restricitons, Impaired sensation, Postural dysfunction, Impaired UE functional use, Pain  Visit Diagnosis: Aftercare following surgery for neoplasm  Abnormal posture  Localized edema  Stiffness of right shoulder, not elsewhere classified  Stiffness of left shoulder, not elsewhere  classified  Carcinoma in situ of left breast, unspecified type     Problem List Patient Active Problem List   Diagnosis Date Noted   Neoplasm of left breast, primary tumor staging category Tis: ductal carcinoma in situ (DCIS) 11/25/2020   Genetic testing 10/14/2020   Family history of breast cancer    Personal history of malignant neoplasm of breast    Ductal carcinoma in situ of breast    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    Claris Pong 03/22/2021, 5:54 PM  [place] [place], [place], [address] Phone: [phone]   Fax:  [phone]  Name: Candice Maldonado MRN: [id number] Date of Birth: 1956-02-28  Cheral Almas, PT 03/22/21 5:55 PM

## 2021-03-24 ENCOUNTER — Other Ambulatory Visit: Payer: Self-pay

## 2021-03-24 ENCOUNTER — Ambulatory Visit: Payer: 59

## 2021-03-24 DIAGNOSIS — Z483 Aftercare following surgery for neoplasm: Secondary | ICD-10-CM | POA: Diagnosis not present

## 2021-03-24 DIAGNOSIS — D0592 Unspecified type of carcinoma in situ of left breast: Secondary | ICD-10-CM

## 2021-03-24 DIAGNOSIS — M25612 Stiffness of left shoulder, not elsewhere classified: Secondary | ICD-10-CM

## 2021-03-24 DIAGNOSIS — R600 Localized edema: Secondary | ICD-10-CM

## 2021-03-24 DIAGNOSIS — R293 Abnormal posture: Secondary | ICD-10-CM

## 2021-03-24 DIAGNOSIS — M25611 Stiffness of right shoulder, not elsewhere classified: Secondary | ICD-10-CM

## 2021-03-24 NOTE — Therapy (Signed)
[place], [place], [address] Phone: [phone]   Fax:  [phone]  Physical Therapy Treatment  Patient Details  Name: Candice Maldonado MRN: [id number] Date of Birth: [birth year] Referring Provider (PT): Dr. Georgette Dover   Encounter Date: 03/24/2021   PT End of Session - 03/24/21 1453     Visit Number 13    Number of Visits 20    Date for PT Re-Evaluation 04/19/21    PT Start Time 1700    PT Stop Time 1452    PT Time Calculation (min) 45 min    Activity Tolerance Patient tolerated treatment well    Behavior During Therapy [facility] for tasks assessed/performed             Past Medical History:  Diagnosis Date   Anemia    had blood transfusion   Arthritis    Breast cancer ([place]) 2000   Colon polyps 2015   Depression    Diabetes ([place])    type 2  - diet controlled    Ductal carcinoma in situ of breast    Family history of breast cancer    Gallstones    GERD (gastroesophageal reflux disease)    HLD (hyperlipidemia)    Localized swelling of both lower legs    on HCTZ   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    Obesity    Personal history of chemotherapy    Personal history of malignant neoplasm of breast    Personal history of radiation therapy     Past Surgical History:  Procedure Laterality Date   BACK SURGERY  2011   BREAST LUMPECTOMY     CHOLECYSTECTOMY  1980   EVACUATION BREAST HEMATOMA Bilateral 11/26/2020   Procedure: EVACUATION POST-OP CHEST WALL HEMATOMA;  Surgeon: Donnie Mesa, MD;  Location: [facility];  Service: General;  Laterality: Bilateral;   LEFT MASTECTOMY AND RIGHT PROPHYLACTIC MASTECTOMY (Bilateral Breast)  11/25/2020   TOTAL MASTECTOMY Bilateral 11/25/2020   Procedure: LEFT MASTECTOMY AND RIGHT PROPHYLACTIC MASTECTOMY;  Surgeon: Donnie Mesa, MD;  Location: [facility];  Service: General;  Laterality: Bilateral;  LMA, PEC BLOCK; START TIME OF 7:30 AM FOR 120  MINUTES TSUEI IQ   TUBAL LIGATION  1990    There were no vitals filed for this visit.   Subjective Assessment - 03/24/21 1407     Subjective Back pain has improved since she returned from [facility]. Still notice the swelling/firmness in chest on the right the most.    Pertinent History Pt has a history of prior left breast CA with lumpectomy and extensive ALND of she believes 11 LN in 2000. She had radiation and chemotherapy at that time.  She had recent bilateral mastectomies with right being prophylactic.  Surgery note does not note right LN removal, however [name] note says 0/6 LN removed on right.  On 11/26/2020 pt had to have another surgery for evacuation of chest wall hematoma.  Her drains were removed on December 10, 2020.  Pt is disabled due to back pain.    How long can you stand comfortably? not long secondary to back pain    How long can you walk comfortably? not long secondary to back pain    Patient Stated Goals Decrease right arm pain/chest tightness    Currently in Pain? Yes    Pain Score 6     Pain Location Hip    Pain Orientation Left    Pain Descriptors /  Indicators Aching    Pain Type Chronic pain    Pain Onset More than a month ago    Pain Frequency Intermittent    Multiple Pain Sites No                               [facility] Adult PT Treatment/Exercise - 03/24/21 0001       Manual Therapy   Soft tissue mobilization scar mobilization bilaterally to incisions/ bilateral pectoralis and lats    Myofascial Release to bilateral chest region    Manual Lymphatic Drainage (MLD) short neck, left inguinal LN, left chest area of fibrosis directing to left inguinal area, and ending with LN repeated on right side of chest to right inguinal area multiple times and ended with LN's.  Pt was instructed in all and practiced all steps    Passive ROM Bilateral shoulder flex, scaption, abd, d 2 flex                         PT Long Term Goals - 03/22/21  1349       PT LONG TERM GOAL #1   Title pt will be independent and compliant with HEP to improve ROM and strength of bilateral shoulders    Time 4    Period Weeks    Status Achieved      PT LONG TERM GOAL #2   Title pt will have bilateral shoulder ROM WFL for normal performance of home activities    Baseline left WFL, right improved but still limited by pain    Time 6    Period Weeks    Status On-going    Target Date 04/19/21      PT LONG TERM GOAL #3   Title Pt will have decreased right arm and chest pain by greater than 50%    Time 6    Period Weeks    Status Achieved      PT LONG TERM GOAL #4   Title Pt will be fit for compression sleeve to for precautionary regions and can don/doff independently    Time 4    Period Weeks    Status Achieved      PT LONG TERM GOAL #5   Title pt will have improvement in quick dash by atleast 20 points to demonstrate improved function    Baseline 65% at eval, 30 % today    Time 6    Period Weeks    Status Achieved      Additional Long Term Goals   Additional Long Term Goals Yes      PT LONG TERM GOAL #6   Title Pt will be independent with MLD and use of chip packs to decrease chest edema    Time 4    Period Weeks    Status New    Target Date 04/19/21                   Plan - 03/24/21 1453     Clinical Impression Statement Pt continues with left prox. incision swelling/fibrosis, and right inferior incision swelling/fibrosis.  Tightness continues in bilateral pectoralis. Pt had less right shoulder pain and improved tolerance for PROM today.  Multiple cords still noted in Right UE with noticeable tightness    Personal Factors and Comorbidities Comorbidity 3+    Comorbidities reoccurence of left breast CA, prior radiation, prior chemo, on disability for back  pain, LN removed bilaterally    Examination-Activity Limitations Bathing;Carry;Dressing;Lift;Stand;Sleep;Reach Overhead    Stability/Clinical Decision Making  Stable/Uncomplicated    Rehab Potential Good    PT Frequency 2x / week    PT Duration 6 weeks    PT Treatment/Interventions Therapeutic exercise;Patient/family education;Manual techniques;Passive range of motion;Scar mobilization;Compression bandaging;Manual lymph drainage    PT Next Visit Plan chip packs for chest, MLD  chest to inguinal LN's, STM, PROM prn    PT Home Exercise Plan 4 post op exercises;to try supine flex and stargazer, supine wand flex and scap    Consulted and Agree with Plan of Care Patient             Patient will benefit from skilled therapeutic intervention in order to improve the following deficits and impairments:  Decreased knowledge of precautions, Decreased range of motion, Decreased scar mobility, Decreased strength, Increased edema, Increased fascial restricitons, Impaired sensation, Postural dysfunction, Impaired UE functional use, Pain  Visit Diagnosis: Aftercare following surgery for neoplasm  Abnormal posture  Localized edema  Stiffness of right shoulder, not elsewhere classified  Stiffness of left shoulder, not elsewhere classified  Carcinoma in situ of left breast, unspecified type     Problem List Patient Active Problem List   Diagnosis Date Noted   Neoplasm of left breast, primary tumor staging category Tis: ductal carcinoma in situ (DCIS) 11/25/2020   Genetic testing 10/14/2020   Family history of breast cancer    Personal history of malignant neoplasm of breast    Ductal carcinoma in situ of breast    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    Claris Pong 03/24/2021, 5:25 PM  [place], [place], [address] Phone: [phone]   Fax:  [phone]  Name: Candice Maldonado MRN: [id number] Date of Birth: 1956/01/11  Cheral Almas, PT 03/24/21 5:27 PM

## 2021-03-30 ENCOUNTER — Ambulatory Visit: Payer: 59

## 2021-03-31 ENCOUNTER — Ambulatory Visit: Payer: 59

## 2021-04-07 ENCOUNTER — Other Ambulatory Visit: Payer: Self-pay

## 2021-04-07 ENCOUNTER — Ambulatory Visit: Payer: 59

## 2021-04-07 DIAGNOSIS — R600 Localized edema: Secondary | ICD-10-CM

## 2021-04-07 DIAGNOSIS — R293 Abnormal posture: Secondary | ICD-10-CM

## 2021-04-07 DIAGNOSIS — Z483 Aftercare following surgery for neoplasm: Secondary | ICD-10-CM | POA: Diagnosis not present

## 2021-04-07 DIAGNOSIS — M25612 Stiffness of left shoulder, not elsewhere classified: Secondary | ICD-10-CM

## 2021-04-07 DIAGNOSIS — M25611 Stiffness of right shoulder, not elsewhere classified: Secondary | ICD-10-CM

## 2021-04-07 DIAGNOSIS — D0592 Unspecified type of carcinoma in situ of left breast: Secondary | ICD-10-CM

## 2021-04-07 NOTE — Therapy (Signed)
[place], [place], [address] Phone: [phone]   Fax:  [phone]  Physical Therapy Treatment  Patient Details  Name: Candice Maldonado MRN: [id number] Date of Birth: [birth year] Referring Provider (PT): Dr. Georgette Dover   Encounter Date: 04/07/2021   PT End of Session - 04/07/21 1556     Visit Number 14    Number of Visits 20    Date for PT Re-Evaluation 04/19/21    PT Start Time 6962    PT Stop Time 9528    PT Time Calculation (min) 54 min    Activity Tolerance Patient tolerated treatment well    Behavior During Therapy [facility] for tasks assessed/performed             Past Medical History:  Diagnosis Date   Anemia    had blood transfusion   Arthritis    Breast cancer ([place]) 2000   Colon polyps 2015   Depression    Diabetes ([place])    type 2  - diet controlled    Ductal carcinoma in situ of breast    Family history of breast cancer    Gallstones    GERD (gastroesophageal reflux disease)    HLD (hyperlipidemia)    Localized swelling of both lower legs    on HCTZ   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    Obesity    Personal history of chemotherapy    Personal history of malignant neoplasm of breast    Personal history of radiation therapy     Past Surgical History:  Procedure Laterality Date   BACK SURGERY  2011   BREAST LUMPECTOMY     CHOLECYSTECTOMY  1980   EVACUATION BREAST HEMATOMA Bilateral 11/26/2020   Procedure: EVACUATION POST-OP CHEST WALL HEMATOMA;  Surgeon: Donnie Mesa, MD;  Location: [facility];  Service: General;  Laterality: Bilateral;   LEFT MASTECTOMY AND RIGHT PROPHYLACTIC MASTECTOMY (Bilateral Breast)  11/25/2020   TOTAL MASTECTOMY Bilateral 11/25/2020   Procedure: LEFT MASTECTOMY AND RIGHT PROPHYLACTIC MASTECTOMY;  Surgeon: Donnie Mesa, MD;  Location: [facility];  Service: General;  Laterality: Bilateral;  LMA, PEC BLOCK; START TIME OF 7:30 AM FOR 120  MINUTES TSUEI IQ   TUBAL LIGATION  1990    There were no vitals filed for this visit.   Subjective Assessment - 04/07/21 1503     Subjective I tried the black pad under my bra today to see if it would soften things. I had to get a shot Tuesday because my back was a mess .  Its some better now.  The pain was shooting down my leg but not right now. I have been wearing the glove at times to support my wrist.  The swelling has been better over the last week.    Pertinent History Pt has a history of prior left breast CA with lumpectomy and extensive ALND of she believes 11 LN in 2000. She had radiation and chemotherapy at that time.  She had recent bilateral mastectomies with right being prophylactic.  Surgery note does not note right LN removal, however [name] note says 0/6 LN removed on right.  On 11/26/2020 pt had to have another surgery for evacuation of chest wall hematoma.  Her drains were removed on December 10, 2020.  Pt is disabled due to back pain.  [facility] Adult PT Treatment/Exercise - 04/07/21 0001       Shoulder Exercises: Supine   Other Supine Exercises AROM flexion, scaption, and horizontal abd x 5      Manual Therapy   Soft tissue mobilization scar mobilization bilaterally to incisions    Myofascial Release to bilateral chest region    Manual Lymphatic Drainage (MLD) short neck, left inguinal LN, left chest area of fibrosis directing to left inguinal area, and ending with LN repeated on right side of chest to right inguinal area multiple times and ended with LN's.    Passive ROM Bilateral shoulder flex, scaption, abd, d 2 flex                         PT Long Term Goals - 03/22/21 1349       PT LONG TERM GOAL #1   Title pt will be independent and compliant with HEP to improve ROM and strength of bilateral shoulders    Time 4    Period Weeks    Status Achieved      PT LONG TERM GOAL #2   Title pt will have  bilateral shoulder ROM WFL for normal performance of home activities    Baseline left WFL, right improved but still limited by pain    Time 6    Period Weeks    Status On-going    Target Date 04/19/21      PT LONG TERM GOAL #3   Title Pt will have decreased right arm and chest pain by greater than 50%    Time 6    Period Weeks    Status Achieved      PT LONG TERM GOAL #4   Title Pt will be fit for compression sleeve to for precautionary regions and can don/doff independently    Time 4    Period Weeks    Status Achieved      PT LONG TERM GOAL #5   Title pt will have improvement in quick dash by atleast 20 points to demonstrate improved function    Baseline 65% at eval, 30 % today    Time 6    Period Weeks    Status Achieved      Additional Long Term Goals   Additional Long Term Goals Yes      PT LONG TERM GOAL #6   Title Pt will be independent with MLD and use of chip packs to decrease chest edema    Time 4    Period Weeks    Status New    Target Date 04/19/21                   Plan - 04/07/21 1559     Clinical Impression Statement Pt had on her black breast pad, chip packs and compression bra today.  The chest area was softer and the incisions more mobile today.  She has been working hard on her shoulder ROM and left shoulder ROM appears equal to right is supine. There is still cording noted in the right axillary region.Pt has been compliant with MLD and exercises.  Her hand and wrist were noted to be less swollen today as well    Personal Factors and Comorbidities Comorbidity 3+    Comorbidities reoccurence of left breast CA, prior radiation, prior chemo, on disability for back pain, LN removed bilaterally    Examination-Activity Limitations Bathing;Carry;Dressing;Lift;Stand;Sleep;Reach Overhead    Stability/Clinical Decision Making Stable/Uncomplicated  Rehab Potential Good    PT Frequency 2x / week    PT Duration 6 weeks    PT Treatment/Interventions  Therapeutic exercise;Patient/family education;Manual techniques;Passive range of motion;Scar mobilization;Compression bandaging;Manual lymph drainage    PT Next Visit Plan chip packs for chest, MLD  chest to inguinal LN's, STM, PROM prn, review wrapping prn    PT Home Exercise Plan 4 post op exercises;to try supine flex and stargazer, supine wand flex and scap    Consulted and Agree with Plan of Care Patient             Patient will benefit from skilled therapeutic intervention in order to improve the following deficits and impairments:  Decreased knowledge of precautions, Decreased range of motion, Decreased scar mobility, Decreased strength, Increased edema, Increased fascial restricitons, Impaired sensation, Postural dysfunction, Impaired UE functional use, Pain  Visit Diagnosis: Aftercare following surgery for neoplasm  Abnormal posture  Localized edema  Stiffness of right shoulder, not elsewhere classified  Stiffness of left shoulder, not elsewhere classified  Carcinoma in situ of left breast, unspecified type     Problem List Patient Active Problem List   Diagnosis Date Noted   Neoplasm of left breast, primary tumor staging category Tis: ductal carcinoma in situ (DCIS) 11/25/2020   Genetic testing 10/14/2020   Family history of breast cancer    Personal history of malignant neoplasm of breast    Ductal carcinoma in situ of breast    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    Claris Pong 04/07/2021, 5:06 PM  [place], [place], [address] Phone: [phone]   Fax:  [phone]  Name: Dekota Kirlin MRN: [id number] Date of Birth: 1955-10-10 Cheral Almas, PT 04/07/21 5:08 PM

## 2021-04-15 ENCOUNTER — Other Ambulatory Visit: Payer: Self-pay

## 2021-04-15 ENCOUNTER — Ambulatory Visit: Payer: 59 | Attending: Surgery

## 2021-04-15 DIAGNOSIS — D0592 Unspecified type of carcinoma in situ of left breast: Secondary | ICD-10-CM | POA: Insufficient documentation

## 2021-04-15 DIAGNOSIS — R600 Localized edema: Secondary | ICD-10-CM | POA: Insufficient documentation

## 2021-04-15 DIAGNOSIS — Z483 Aftercare following surgery for neoplasm: Secondary | ICD-10-CM | POA: Diagnosis not present

## 2021-04-15 DIAGNOSIS — M25611 Stiffness of right shoulder, not elsewhere classified: Secondary | ICD-10-CM | POA: Diagnosis present

## 2021-04-15 DIAGNOSIS — M25612 Stiffness of left shoulder, not elsewhere classified: Secondary | ICD-10-CM | POA: Diagnosis present

## 2021-04-15 DIAGNOSIS — R293 Abnormal posture: Secondary | ICD-10-CM | POA: Diagnosis present

## 2021-04-15 NOTE — Therapy (Signed)
[place], [place], [address] Phone: [phone]   Fax:  [phone]  Physical Therapy Treatment  Patient Details  Name: Candice Maldonado MRN: [id number] Date of Birth: May 01, 1956 Referring Provider (PT): Dr. Georgette Dover   Encounter Date: 04/15/2021   PT End of Session - 04/15/21 1203     Visit Number 15    Number of Visits 20    Date for PT Re-Evaluation 04/19/21    PT Start Time 1112    PT Stop Time 1200    PT Time Calculation (min) 48 min    Activity Tolerance Patient tolerated treatment well    Behavior During Therapy [facility] for tasks assessed/performed             Past Medical History:  Diagnosis Date   Anemia    had blood transfusion   Arthritis    Breast cancer ([place]) 2000   Colon polyps 2015   Depression    Diabetes ([place])    type 2  - diet controlled    Ductal carcinoma in situ of breast    Family history of breast cancer    Gallstones    GERD (gastroesophageal reflux disease)    HLD (hyperlipidemia)    Localized swelling of both lower legs    on HCTZ   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    Obesity    Personal history of chemotherapy    Personal history of malignant neoplasm of breast    Personal history of radiation therapy     Past Surgical History:  Procedure Laterality Date   BACK SURGERY  2011   BREAST LUMPECTOMY     CHOLECYSTECTOMY  1980   EVACUATION BREAST HEMATOMA Bilateral 11/26/2020   Procedure: EVACUATION POST-OP CHEST WALL HEMATOMA;  Surgeon: Donnie Mesa, MD;  Location: [facility];  Service: General;  Laterality: Bilateral;   LEFT MASTECTOMY AND RIGHT PROPHYLACTIC MASTECTOMY (Bilateral Breast)  11/25/2020   TOTAL MASTECTOMY Bilateral 11/25/2020   Procedure: LEFT MASTECTOMY AND RIGHT PROPHYLACTIC MASTECTOMY;  Surgeon: Donnie Mesa, MD;  Location: [facility];  Service: General;  Laterality: Bilateral;  LMA, PEC BLOCK; START TIME OF 7:30 AM FOR 120  MINUTES TSUEI IQ   TUBAL LIGATION  1990    There were no vitals filed for this visit.   Subjective Assessment - 04/15/21 1119     Subjective I feel pretty good.  My arms are doing well, but sometimes my chest still gets tight.  I wear the black swell pad at night with the chip pack and that helps some.    Pertinent History Pt has a history of prior left breast CA with lumpectomy and extensive ALND of she believes 11 LN in 2000. She had radiation and chemotherapy at that time.  She had recent bilateral mastectomies with right being prophylactic.  Surgery note does not note right LN removal, however [name] note says 0/6 LN removed on right.  On 11/26/2020 pt had to have another surgery for evacuation of chest wall hematoma.  Her drains were removed on December 10, 2020.  Pt is disabled due to back pain.    How long can you stand comfortably? not long secondary to back pain    How long can you walk comfortably? not long secondary to back pain    Patient Stated Goals Decrease right arm pain/chest tightness    Currently in Pain? Yes    Pain Score 8     Pain  Location Back    Pain Orientation Left    Pain Descriptors / Indicators Aching    Pain Type Chronic pain    Pain Onset More than a month ago    Pain Frequency Intermittent    Multiple Pain Sites No    Pain Orientation Right;Left    Pain Descriptors / Indicators Aching    Pain Type Chronic pain    Pain Onset More than a month ago    Pain Frequency Constant                [facility] PT Assessment - 04/15/21 0001       Assessment   Medical Diagnosis Bilateral mastectomy    Referring Provider (PT) Dr. Georgette Dover    Onset Date/Surgical Date 11/25/20    Hand Dominance Right      Precautions   Precaution Comments Lymphedema risk      AROM   Right Shoulder Extension 67 Degrees    Right Shoulder Flexion 152 Degrees    Right Shoulder ABduction 150 Degrees    Left Shoulder Extension 65 Degrees    Left Shoulder Flexion 159 Degrees     Left Shoulder ABduction 150 Degrees    Left Shoulder External Rotation 90 Degrees               LYMPHEDEMA/ONCOLOGY QUESTIONNAIRE - 04/15/21 0001       Right Upper Extremity Lymphedema   15 cm Proximal to Olecranon Process 34.8 cm    Olecranon Process 27.9 cm    15 cm Proximal to Ulnar Styloid Process 24.8 cm    Just Proximal to Ulnar Styloid Process 17.6 cm    At Base of 2nd Digit 6.9 cm      Left Upper Extremity Lymphedema   15 cm Proximal to Olecranon Process 36 cm    Olecranon Process 27.2 cm    15 cm Proximal to Ulnar Styloid Process 24.8 cm    Just Proximal to Ulnar Styloid Process 16.5 cm    At Base of 2nd Digit 6.4 cm                        [facility] Adult PT Treatment/Exercise - 04/15/21 0001       Manual Therapy   Manual Lymphatic Drainage (MLD) Reviewed and had pt. practice MLD to right UE and chest regions and gave pt another handout.                         PT Long Term Goals - 04/15/21 1157       PT LONG TERM GOAL #1   Title pt will be independent and compliant with HEP to improve ROM and strength of bilateral shoulders    Time 4    Period Weeks    Status Achieved      PT LONG TERM GOAL #2   Title pt will have bilateral shoulder ROM WFL for normal performance of home activities    Baseline left WFL, right improved but still limited by pain    Time 6    Period Weeks    Status Achieved      PT LONG TERM GOAL #3   Title Pt will have decreased right arm and chest pain by greater than 50%    Time 6    Period Weeks    Status Achieved      PT LONG TERM GOAL #4   Title Pt will be fit  for compression sleeve to for precautionary regions and can don/doff independently    Time 4    Period Weeks    Status Achieved      PT LONG TERM GOAL #5   Title pt will have improvement in quick dash by atleast 20 points to demonstrate improved function    Time 6    Status Achieved      PT LONG TERM GOAL #6   Title Pt will be  independent with MLD and use of chip packs to decrease chest edema    Period Weeks    Status Achieved                   Plan - 04/15/21 1204     Clinical Impression Statement Pt was reassessed today because she felt ready for DC.  She demonstrated excellent shoulder ROM and has had good decrease in right shoulder pain.  Circumference measures are still slightly larger on the right, however she has a sleeve and glove and has been instructed in wrapping as needed to reduce.  We reviewed MLD techniques to her chest and arm today and she did require cueing mainly for using proper stretch and not sliding.  She has achieved all goals established and is discharged.    Personal Factors and Comorbidities Comorbidity 3+    Comorbidities reoccurence of left breast CA, prior radiation, prior chemo, on disability for back pain, LN removed bilaterally    Examination-Activity Limitations Bathing;Carry;Dressing;Lift;Stand;Sleep;Reach Overhead    Stability/Clinical Decision Making Stable/Uncomplicated    Rehab Potential Good    PT Frequency 2x / week    PT Duration 6 weeks    PT Treatment/Interventions Therapeutic exercise;Patient/family education;Manual techniques;Passive range of motion;Scar mobilization;Compression bandaging;Manual lymph drainage    PT Next Visit Plan DC to HEP    PT Home Exercise Plan 4 post op exercises;to try supine flex and stargazer, supine wand flex and scap, use jovipac for chest edema, MLD, sleeve, glove prn    Consulted and Agree with Plan of Care Patient             Patient will benefit from skilled therapeutic intervention in order to improve the following deficits and impairments:  Decreased knowledge of precautions, Decreased range of motion, Decreased scar mobility, Decreased strength, Increased edema, Increased fascial restricitons, Impaired sensation, Postural dysfunction, Impaired UE functional use, Pain  Visit Diagnosis: Aftercare following surgery for  neoplasm  Abnormal posture  Localized edema  Stiffness of right shoulder, not elsewhere classified  Stiffness of left shoulder, not elsewhere classified  Carcinoma in situ of left breast, unspecified type     Problem List Patient Active Problem List   Diagnosis Date Noted   Neoplasm of left breast, primary tumor staging category Tis: ductal carcinoma in situ (DCIS) 11/25/2020   Genetic testing 10/14/2020   Family history of breast cancer    Personal history of malignant neoplasm of breast    Ductal carcinoma in situ of breast    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  PHYSICAL THERAPY DISCHARGE SUMMARY  Visits from Start of Care: 15  Current functional level related to goals / functional outcomes: Achieved all goals   Remaining deficits: Intermittent chest tightness   Education / Equipment: Compression sleeve, glove, Jovi pac for chest area  Patient agrees to discharge. Patient goals were met. Patient is being discharged due to meeting the stated rehab goals.   Claris Pong 04/15/2021, 12:08 PM  [facility]  [place], [place], [address] Phone: [phone]   Fax:  [phone]  Name: Candice Maldonado MRN: [id number] Date of Birth: 1956/05/17  Cheral Almas, PT 04/15/21 12:10 PM

## 2021-06-14 ENCOUNTER — Ambulatory Visit
Admission: EM | Admit: 2021-06-14 | Discharge: 2021-06-14 | Disposition: A | Discharge status: Home / Self Care | Payer: Medicare Other | Attending: Internal Medicine | Admitting: Internal Medicine

## 2021-06-14 ENCOUNTER — Other Ambulatory Visit: Payer: Self-pay

## 2021-06-14 DIAGNOSIS — M5442 Lumbago with sciatica, left side: Secondary | ICD-10-CM | POA: Diagnosis not present

## 2021-06-14 MED ORDER — KETOROLAC TROMETHAMINE 60 MG/2ML IM SOLN
30.0000 mg | Freq: Once | INTRAMUSCULAR | Status: AC
  Administered 2021-06-14: 30 mg via INTRAMUSCULAR

## 2021-06-14 NOTE — ED Provider Notes (Signed)
[facility]    CSN: [id number] Arrival date & time: 06/14/21  1037      History   Chief Complaint Chief Complaint  Patient presents with   Back Pain    HPI Candice Maldonado is a 65 y.o. female.   Patient presents with left-sided back pain that radiates down left leg that has been present for approximately 1 week.  Patient denies any injury but does have history of back pain where she is followed by spine specialist.  Has received "spine injections" in the past.  Patient states that she last saw spine specialist about 2 years ago.  Is also followed by pain clinic that she saw earlier this month.  Denies any urinary burning, urinary frequency, urinary or bowel incontinence, saddle anesthesia.  Currently takes oxycodone, Lyrica, baclofen, naproxen.   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    Obesity    Personal history of chemotherapy    Personal history of malignant neoplasm of breast    Personal history of radiation therapy     Patient Active Problem List   Diagnosis Date Noted   Neoplasm of left breast, primary tumor staging category Tis: ductal carcinoma in situ (DCIS) 11/25/2020   Genetic testing 10/14/2020   Family history of breast cancer    Personal history of malignant neoplasm of breast    Ductal carcinoma in situ of breast    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    Past Surgical History:  Procedure Laterality Date   BACK SURGERY  2011   BREAST LUMPECTOMY     CHOLECYSTECTOMY  1980    EVACUATION BREAST HEMATOMA Bilateral 11/26/2020   Procedure: EVACUATION POST-OP CHEST WALL HEMATOMA;  Surgeon: Donnie Mesa, MD;  Location: [place];  Service: General;  Laterality: Bilateral;   LEFT MASTECTOMY AND RIGHT PROPHYLACTIC MASTECTOMY (Bilateral Breast)  11/25/2020   TOTAL MASTECTOMY Bilateral 11/25/2020   Procedure: LEFT MASTECTOMY AND RIGHT PROPHYLACTIC MASTECTOMY;  Surgeon: Donnie Mesa, MD;  Location: [place];  Service: General;  Laterality: Bilateral;  LMA, PEC BLOCK; START TIME OF 7:30 AM FOR 120 MINUTES TSUEI IQ   TUBAL LIGATION  1990    OB History   No obstetric history on file.      Home Medications    Prior to Admission medications   Medication Sig Start Date End Date Taking? Authorizing Provider  ascorbic acid (VITAMIN C) 500 MG tablet Take 500 mg by mouth daily.    [provider]  atorvastatin (LIPITOR) 10 MG tablet Take 10 mg by mouth daily.    [provider]  baclofen (LIORESAL) 10 MG tablet Take 10 mg by mouth 2 (two) times daily.    [provider]  Black Cohosh 40 MG CAPS Take 40 mg by mouth daily.    [provider]  cholecalciferol (VITAMIN D3) 25 MCG (1000 UNIT) tablet Take 1,000 Units  by mouth daily.    [provider]  ELDERBERRY PO Take 1 capsule by mouth daily.    [provider]  hydrochlorothiazide (HYDRODIURIL) 25 MG tablet Take 25 mg by mouth daily.    [provider]  meloxicam (MOBIC) 7.5 MG tablet Take 1 tablet (7.5 mg total) by mouth daily. Take in the morning, with food. Patient not taking: No sig reported 09/28/20   Wieters, [employer] C, PA-C  Multiple Vitamin (MULTIVITAMIN) tablet Take 1 tablet by mouth daily.    [provider]  naproxen (NAPROSYN) 500 MG tablet Take 500 mg by mouth every 12 (twelve) hours as needed for moderate pain.    [provider]  omeprazole (PRILOSEC) 20 MG capsule Take 20 mg by mouth daily.    [provider]  oxyCODONE (OXY  IR/ROXICODONE) 5 MG immediate release tablet Take 1-2 tablets (5-10 mg total) by mouth every 6 (six) hours as needed for moderate pain. 11/26/20   Donnie Mesa, MD  pregabalin (LYRICA) 200 MG capsule Take 200 mg by mouth 2 (two) times daily.    [provider]  pyridOXINE (VITAMIN B-6) 100 MG tablet Take 100 mg by mouth daily. Patient not taking: No sig reported    [provider]  tiZANidine (ZANAFLEX) 4 MG tablet Take 1 tablet (4 mg total) by mouth every 6 (six) hours as needed for muscle spasms. Patient not taking: No sig reported 09/28/20   Wieters, Hallie C, PA-C  vitamin B-12 (CYANOCOBALAMIN) 1000 MCG tablet Take 1,000 mcg by mouth daily.    [provider]    Family History Family History  Problem Relation Age of Onset   Breast cancer Mother        dx >16   Diabetes Mother    Diabetes Sister    Breast cancer Sister 60   Diabetes Brother    Breast cancer Maternal Grandmother        dx >60   Irritable bowel syndrome Daughter    Breast cancer Daughter 76   Stroke Father    Diabetes Father    Breast cancer Other        MGMs sister    Social History Social History   Tobacco Use   Smoking status: Never   Smokeless tobacco: Current    Types: Chew  Vaping Use   Vaping Use: Never used  Substance Use Topics   Alcohol use: Not Currently    Comment: occasional wine   Drug use: Never     Allergies   Tape   Review of Systems Review of Systems Per HPI  Physical Exam Triage Vital Signs ED Triage Vitals  Enc Vitals Group     BP 06/14/21 1204 (!) 163/99     Pulse Rate 06/14/21 1204 77     Resp 06/14/21 1204 18     Temp 06/14/21 1204 98.4 F (36.9 C)     Temp Source 06/14/21 1204 Oral     SpO2 06/14/21 1204 98 %     Weight --      Height --      Head Circumference --      Peak Flow --      Pain Score 06/14/21 1205 8     Pain Loc --      Pain Edu? --      Excl. in [place]? --    No data found.  Updated Vital Signs BP (!) 163/99 (BP  Location: Left Leg)   Pulse 77  Temp 98.4 F (36.9 C) (Oral)   Resp 18   SpO2 98%   Visual Acuity Right Eye Distance:   Left Eye Distance:   Bilateral Distance:    Right Eye Near:   Left Eye Near:    Bilateral Near:     Physical Exam Constitutional:      Appearance: Normal appearance.  HENT:     Head: Normocephalic and atraumatic.  Eyes:     Extraocular Movements: Extraocular movements intact.     Conjunctiva/sclera: Conjunctivae normal.  Pulmonary:     Effort: Pulmonary effort is normal.  Musculoskeletal:     Cervical back: Normal.     Thoracic back: Normal.     Lumbar back: Tenderness present. No swelling, deformity or bony tenderness. Negative right straight leg raise test and negative left straight leg raise test.     Comments: Tenderness to palpation to left lower lumbar region.  Vascular intact.  Neurological:     General: No focal deficit present.     Mental Status: She is alert and oriented to person, place, and time. Mental status is at baseline.     Deep Tendon Reflexes: Reflexes are normal and symmetric.  Psychiatric:        Mood and Affect: Mood normal.        Behavior: Behavior normal.        Thought Content: Thought content normal.        Judgment: Judgment normal.     UC Treatments / Results  Labs (all labs ordered are listed, but only abnormal results are displayed) Labs Reviewed - No data to display  EKG   Radiology No results found.  Procedures Procedures (including critical care time)  Medications Ordered in UC Medications  ketorolac (TORADOL) injection 30 mg (30 mg Intramuscular Given 06/14/21 1238)    Initial Impression / Assessment and Plan / UC Course  I have reviewed the triage vital signs and the nursing notes.  Pertinent labs & imaging results that were available during my care of the patient were reviewed by me and considered in my medical decision making (see chart for details).     Limited options for pain management for  lower back pain with sciatica due to patient's current pain regimen.  Offered patient prednisone steroid course but patient declined and would like to defer to spine specialist due to possibility of receiving spine injection.  Patient advised to follow-up with spine specialist as soon as possible.  Administer ketorolac injection in urgent care today due to history of successful pain management with this medication.  Advised patient to not take any additional naproxen, ibuprofen, Advil, Aleve for at least 24 hours following injection.  Patient to alternate ice and heat application as well.  No red flags seen on exam.  Discussed strict return precautions. Patient verbalized understanding and is agreeable with plan.  Final Clinical Impressions(s) / UC Diagnoses   Final diagnoses:  Acute left-sided low back pain with left-sided sciatica     Discharge Instructions      You were given ketorolac injection today in urgent care.  Please avoid taking any naproxen, Advil, Aleve, ibuprofen for at least 24 hours following injection.  Please follow-up with your spine specialist for further evaluation management.     ED Prescriptions   None    PDMP not reviewed this encounter.   Odis Luster, FNP 06/14/21 1326

## 2021-06-14 NOTE — ED Triage Notes (Signed)
Pt c/o left lower back pain onset last Tuesday that sometimes "shoots down my leg." States has had multiple back surgeries and take oxycodone at home for pain relief. States she sees pain management monthly.

## 2021-06-14 NOTE — Discharge Instructions (Addendum)
You were given ketorolac injection today in urgent care.  Please avoid taking any naproxen, Advil, Aleve, ibuprofen for at least 24 hours following injection.  Please follow-up with your spine specialist for further evaluation management.

## 2021-06-24 ENCOUNTER — Other Ambulatory Visit: Payer: Self-pay | Admitting: Orthopedic Surgery

## 2021-06-24 DIAGNOSIS — M48062 Spinal stenosis, lumbar region with neurogenic claudication: Secondary | ICD-10-CM

## 2021-06-29 ENCOUNTER — Emergency Department (HOSPITAL_COMMUNITY)
Admission: EM | Admit: 2021-06-29 | Discharge: 2021-06-30 | Disposition: A | Discharge status: Home / Self Care | Payer: Medicare Other | Attending: Emergency Medicine | Admitting: Emergency Medicine

## 2021-06-29 ENCOUNTER — Encounter (HOSPITAL_COMMUNITY): Payer: Self-pay | Admitting: Emergency Medicine

## 2021-06-29 DIAGNOSIS — Z853 Personal history of malignant neoplasm of breast: Secondary | ICD-10-CM | POA: Diagnosis not present

## 2021-06-29 DIAGNOSIS — Z7982 Long term (current) use of aspirin: Secondary | ICD-10-CM | POA: Diagnosis not present

## 2021-06-29 DIAGNOSIS — Z79899 Other long term (current) drug therapy: Secondary | ICD-10-CM | POA: Insufficient documentation

## 2021-06-29 DIAGNOSIS — R1110 Vomiting, unspecified: Secondary | ICD-10-CM | POA: Insufficient documentation

## 2021-06-29 DIAGNOSIS — E876 Hypokalemia: Secondary | ICD-10-CM

## 2021-06-29 DIAGNOSIS — R1084 Generalized abdominal pain: Secondary | ICD-10-CM | POA: Insufficient documentation

## 2021-06-29 DIAGNOSIS — R112 Nausea with vomiting, unspecified: Secondary | ICD-10-CM

## 2021-06-29 DIAGNOSIS — E119 Type 2 diabetes mellitus without complications: Secondary | ICD-10-CM | POA: Diagnosis not present

## 2021-06-29 DIAGNOSIS — R1033 Periumbilical pain: Secondary | ICD-10-CM | POA: Diagnosis present

## 2021-06-29 LAB — URINALYSIS, ROUTINE W REFLEX MICROSCOPIC
Bilirubin Urine: NEGATIVE
Glucose, UA: NEGATIVE mg/dL
Hgb urine dipstick: NEGATIVE
Ketones, ur: NEGATIVE mg/dL
Nitrite: NEGATIVE
Protein, ur: NEGATIVE mg/dL
Specific Gravity, Urine: 1.025 (ref 1.005–1.030)
pH: 6 (ref 5.0–8.0)

## 2021-06-29 LAB — COMPREHENSIVE METABOLIC PANEL
ALT: 16 U/L (ref 0–44)
AST: 18 U/L (ref 15–41)
Albumin: 3.3 g/dL — ABNORMAL LOW (ref 3.5–5.0)
Alkaline Phosphatase: 75 U/L (ref 38–126)
Anion gap: 11 (ref 5–15)
BUN: 27 mg/dL — ABNORMAL HIGH (ref 8–23)
CO2: 25 mmol/L (ref 22–32)
Calcium: 8.9 mg/dL (ref 8.9–10.3)
Chloride: 102 mmol/L (ref 98–111)
Creatinine, Ser: 1.03 mg/dL — ABNORMAL HIGH (ref 0.44–1.00)
GFR, Estimated: 60 mL/min — ABNORMAL LOW (ref >60)
Glucose, Bld: 110 mg/dL — ABNORMAL HIGH (ref 70–99)
Potassium: 3.3 mmol/L — ABNORMAL LOW (ref 3.5–5.1)
Sodium: 138 mmol/L (ref 135–145)
Total Bilirubin: 0.5 mg/dL (ref 0.3–1.2)
Total Protein: 6.8 g/dL (ref 6.5–8.1)

## 2021-06-29 LAB — CBC
HCT: 40.1 % (ref 36.0–46.0)
Hemoglobin: 12.3 g/dL (ref 12.0–15.0)
MCH: 25.2 pg — ABNORMAL LOW (ref 26.0–34.0)
MCHC: 30.7 g/dL (ref 30.0–36.0)
MCV: 82.2 fL (ref 80.0–100.0)
Platelets: 333 10*3/uL (ref 150–400)
RBC: 4.88 MIL/uL (ref 3.87–5.11)
RDW: 16.4 % — ABNORMAL HIGH (ref 11.5–15.5)
WBC: 10.9 10*3/uL — ABNORMAL HIGH (ref 4.0–10.5)
nRBC: 0.2 % (ref 0.0–0.2)

## 2021-06-29 LAB — URINALYSIS, MICROSCOPIC (REFLEX): Bacteria, UA: NONE SEEN

## 2021-06-29 LAB — LIPASE, BLOOD: Lipase: 34 U/L (ref 11–51)

## 2021-06-29 MED ORDER — ONDANSETRON HCL 4 MG/2ML IJ SOLN
4.0000 mg | Freq: Once | INTRAMUSCULAR | Status: AC
  Administered 2021-06-29: 4 mg via INTRAVENOUS
  Filled 2021-06-29: qty 2

## 2021-06-29 MED ORDER — ONDANSETRON HCL 4 MG/2ML IJ SOLN
4.0000 mg | Freq: Once | INTRAMUSCULAR | Status: AC
Start: 2021-06-29 — End: 2021-06-30
  Administered 2021-06-30: 4 mg via INTRAVENOUS
  Filled 2021-06-29: qty 2

## 2021-06-29 MED ORDER — SODIUM CHLORIDE 0.9 % IV BOLUS
500.0000 mL | Freq: Once | INTRAVENOUS | Status: AC
  Administered 2021-06-30: 500 mL via INTRAVENOUS

## 2021-06-29 MED ORDER — MORPHINE SULFATE (PF) 4 MG/ML IV SOLN
4.0000 mg | Freq: Once | INTRAVENOUS | Status: AC
  Administered 2021-06-30: 4 mg via INTRAVENOUS
  Filled 2021-06-29: qty 1

## 2021-06-29 NOTE — ED Provider Notes (Signed)
[facility] EMERGENCY DEPARTMENT Provider Note   CSN: [id number] Arrival date & time: 06/29/21  9417     History Chief Complaint  Patient presents with   Abdominal Pain    Candice Maldonado is a 65 y.o. female.  65 year old female with history of DM, breast cancer, presents with complaint of not feeling well last night, woke up today not feeling well. Patient was taking a child to school today when she developed excruciating pain (periumbilical), went to her PCP who sent her to the ER. Pain is periumbilical, does not radiate, 7/10, aching and throbbing in nature, improves sitting up, worse lying supine. 1 episode of vomiting in the lobby today. Similar in nature to before she had her gallbladder removed  in 1980s. Ongoing left flank/back pain which is more bothersome currently for her. Ate a banana and is feeling slightly better and would like to leave, although agrees she appears to be in pain and will stay for further work up. Denies changes in bowel habits, last bowel movement was 2 days ago which is normal for her. No changes in bladder habits. Denies fevers, chills, recent illness or any other complaints or concerns.       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    Obesity    Personal history of chemotherapy    Personal history of malignant neoplasm of breast    Personal history of radiation therapy     Patient Active Problem List   Diagnosis Date Noted   Neoplasm of left breast, primary tumor staging category Tis: ductal carcinoma in  situ (DCIS) 11/25/2020   Genetic testing 10/14/2020   Family history of breast cancer    Personal history of malignant neoplasm of breast    Ductal carcinoma in situ of breast    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    Past Surgical History:  Procedure Laterality Date   BACK SURGERY  2011   BREAST LUMPECTOMY     CHOLECYSTECTOMY  1980   EVACUATION BREAST HEMATOMA Bilateral 11/26/2020   Procedure: EVACUATION POST-OP CHEST WALL HEMATOMA;  Surgeon: Donnie Mesa, MD;  Location: [place];  Service: General;  Laterality: Bilateral;   LEFT MASTECTOMY AND RIGHT PROPHYLACTIC MASTECTOMY (Bilateral Breast)  11/25/2020   TOTAL MASTECTOMY Bilateral 11/25/2020   Procedure: LEFT MASTECTOMY AND RIGHT PROPHYLACTIC MASTECTOMY;  Surgeon: Donnie Mesa, MD;  Location: [place];  Service: General;  Laterality: Bilateral;  LMA, PEC BLOCK; START TIME OF 7:30 AM FOR 120 MINUTES TSUEI IQ   TUBAL LIGATION  1990     OB History   No obstetric history on file.     Family History  Problem Relation Age of Onset   Breast cancer Mother        dx >62   Diabetes Mother    Diabetes Sister    Breast cancer Sister 27   Diabetes Brother  Breast cancer Maternal Grandmother        dx >60   Irritable bowel syndrome Daughter    Breast cancer Daughter 68   Stroke Father    Diabetes Father    Breast cancer Other        MGMs sister    Social History   Tobacco Use   Smoking status: Never   Smokeless tobacco: Current    Types: Chew  Vaping Use   Vaping Use: Never used  Substance Use Topics   Alcohol use: Not Currently    Comment: occasional wine   Drug use: Never    Home Medications Prior to Admission medications   Medication Sig Start Date End Date Taking? Authorizing Provider  ascorbic acid (VITAMIN C) 500 MG tablet Take 500 mg by mouth daily.   Yes [provider]  aspirin EC 81 MG tablet Take 81 mg by mouth daily. Swallow whole.   Yes [provider]   atorvastatin (LIPITOR) 10 MG tablet Take 10 mg by mouth daily.   Yes [provider]  baclofen (LIORESAL) 10 MG tablet Take 10 mg by mouth 2 (two) times daily.   Yes [provider]  Black Cohosh 40 MG CAPS Take 40 mg by mouth daily.   Yes [provider]  cholecalciferol (VITAMIN D3) 25 MCG (1000 UNIT) tablet Take 1,000 Units by mouth daily.   Yes [provider]  ELDERBERRY PO Take 1 capsule by mouth daily.   Yes [provider]  escitalopram (LEXAPRO) 10 MG tablet Take 10 mg by mouth daily. 05/13/21  Yes [provider]  hydrochlorothiazide (HYDRODIURIL) 25 MG tablet Take 25 mg by mouth daily as needed (if is over 130).   Yes [provider]  HYDROcodone-acetaminophen (NORCO) 10-325 MG tablet Take 1 tablet by mouth 3 (three) times daily as needed for severe pain. 04/06/21  Yes [provider]  Multiple Vitamin (MULTIVITAMIN) tablet Take 1 tablet by mouth daily.   Yes [provider]  naproxen (NAPROSYN) 500 MG tablet Take 500 mg by mouth every other day.   Yes [provider]  omeprazole (PRILOSEC) 20 MG capsule Take 20 mg by mouth daily.   Yes [provider]  pregabalin (LYRICA) 200 MG capsule Take 200 mg by mouth 2 (two) times daily.   Yes [provider]  pyridOXINE (VITAMIN B-6) 100 MG tablet Take 100 mg by mouth daily.   Yes [provider]  vitamin B-12 (CYANOCOBALAMIN) 1000 MCG tablet Take 1,000 mcg by mouth daily.   Yes [provider]  meloxicam (MOBIC) 7.5 MG tablet Take 1 tablet (7.5 mg total) by mouth daily. Take in the morning, with food. Patient not taking: No sig reported 09/28/20   Wieters, Hallie C, PA-C  naloxone (NARCAN) nasal spray 4 mg/0.1 mL Place 1 spray into the nose once as needed (overdose).    [provider]  oxyCODONE (OXY IR/ROXICODONE) 5 MG immediate release tablet Take 1-2 tablets (5-10 mg total) by mouth every 6 (six) hours as  needed for moderate pain. Patient not taking: No sig reported 11/26/20   Donnie Mesa, MD  tiZANidine (ZANAFLEX) 4 MG tablet Take 1 tablet (4 mg total) by mouth every 6 (six) hours as needed for muscle spasms. Patient not taking: No sig reported 09/28/20   Wieters, Hallie C, PA-C    Allergies    Tape  Review of Systems   Review of Systems  Constitutional:  Negative for chills and fever.  Respiratory:  Negative for shortness of breath.   Cardiovascular:  Negative for chest pain.  Gastrointestinal:  Positive for abdominal pain, nausea and vomiting. Negative for blood in stool, constipation and diarrhea.  Genitourinary:  Positive for flank pain. Negative for dysuria and frequency.  Musculoskeletal:  Negative for arthralgias and myalgias.  Skin:  Negative for rash and wound.  Allergic/Immunologic: Negative for immunocompromised state.  Neurological:  Negative for weakness.  Hematological:  Negative for adenopathy.  Psychiatric/Behavioral:  Negative for confusion.   All other systems reviewed and are negative.  Physical Exam Updated Vital Signs BP 128/75   Pulse 61   Temp 98.6 F (37 C) (Oral)   Resp 18   Ht 5\' 3"  (1.6 m)   Wt 98.4 kg   SpO2 100%   BMI 38.44 kg/m   Physical Exam Vitals and nursing note reviewed.  Constitutional:      General: She is not in acute distress.    Appearance: She is well-developed. She is not diaphoretic.  HENT:     Head: Normocephalic and atraumatic.  Cardiovascular:     Rate and Rhythm: Normal rate and regular rhythm.     Heart sounds: Normal heart sounds.  Pulmonary:     Effort: Pulmonary effort is normal.     Breath sounds: Normal breath sounds.  Abdominal:     Palpations: Abdomen is soft.     Tenderness: There is generalized abdominal tenderness. There is no right CVA tenderness or left CVA tenderness.  Skin:    General: Skin is warm and dry.     Findings: No erythema or rash.  Neurological:     Mental Status: She is alert and  oriented to person, place, and time.  Psychiatric:        Behavior: Behavior normal.    ED Results / Procedures / Treatments   Labs (all labs ordered are listed, but only abnormal results are displayed) Labs Reviewed  COMPREHENSIVE METABOLIC PANEL - Abnormal; Notable for the following components:      Result Value   Potassium 3.3 (*)    Glucose, Bld 110 (*)    BUN 27 (*)    Creatinine, Ser 1.03 (*)    Albumin 3.3 (*)    GFR, Estimated 60 (*)    All other components within normal limits  CBC - Abnormal; Notable for the following components:   WBC 10.9 (*)    MCH 25.2 (*)    RDW 16.4 (*)    All other components within normal limits  URINALYSIS, ROUTINE W REFLEX MICROSCOPIC - Abnormal; Notable for the following components:   Leukocytes,Ua TRACE (*)    All other components within normal limits  CBC WITH DIFFERENTIAL/PLATELET - Abnormal; Notable for the following components:   Hemoglobin 11.8 (*)    RDW 16.5 (*)    All other components within normal limits  LIPASE, BLOOD  URINALYSIS, MICROSCOPIC (REFLEX)  COMPREHENSIVE METABOLIC PANEL    EKG None  Radiology No results found.  Procedures Procedures   Medications Ordered in ED Medications  ondansetron (ZOFRAN) injection 4 mg (4 mg Intravenous Given 06/29/21 0851)  sodium chloride 0.9 % bolus 500 mL (500 mLs Intravenous New Bag/Given 06/30/21 0007)  morphine 4 MG/ML injection 4 mg (4 mg Intravenous Given 06/30/21 0006)  ondansetron (ZOFRAN) injection 4 mg (4 mg Intravenous Given 06/30/21 0005)    ED Course  I have reviewed the triage vital signs and the nursing notes.  Pertinent labs & imaging results that were available during my care  of the patient were reviewed by me and considered in my medical decision making (see chart for details).  Clinical Course as of 06/30/21 0016  Thu Jun 30, 4749  4130 65 year old female with complaint of abdominal pain with x episode of vomiting as above. On exam, appears uncomfortable,  has periumbilical tenderness.  Labs reviewed, normal WBC, CMP with mild hypokalemia at 3.3, will replace orally. IV fluids for mildly elevated Cr at 1.03. Plan is to redraw labs (drawn on arrival 13 hours earlier), CT abdomen/pelvis with contrast. Given morphine/zofran. Care signed out pending repeat labs and CT. [LM]    Clinical Course User Index [LM] Roque Lias   MDM Rules/Calculators/A&P                           Final Clinical Impression(s) / ED Diagnoses Final diagnoses:  None    Rx / DC Orders ED Discharge Orders     None        Roque Lias 06/30/21 0016    Luna Fuse, MD 07/13/21 1626

## 2021-06-29 NOTE — ED Triage Notes (Signed)
Pt reports last night began with light stomachache. Pt reports severe lower abdominal pain this morning. Pt reports the pain takes her breathe. Pt denies previous hx of same. Denies fever, nausea, vomiting, diarrhea.

## 2021-06-29 NOTE — ED Notes (Signed)
Pt vomiting large amount of food in waiting area, giving prn zofran and getting patients clothes changed.

## 2021-06-30 ENCOUNTER — Other Ambulatory Visit: Payer: Self-pay

## 2021-06-30 ENCOUNTER — Ambulatory Visit
Admission: RE | Admit: 2021-06-30 | Discharge: 2021-06-30 | Disposition: A | Discharge status: Home / Self Care | Payer: Medicare Other | Source: Ambulatory Visit | Attending: Orthopedic Surgery | Admitting: Orthopedic Surgery

## 2021-06-30 ENCOUNTER — Emergency Department (HOSPITAL_COMMUNITY): Payer: Medicare Other

## 2021-06-30 DIAGNOSIS — R1084 Generalized abdominal pain: Secondary | ICD-10-CM | POA: Diagnosis not present

## 2021-06-30 DIAGNOSIS — M48062 Spinal stenosis, lumbar region with neurogenic claudication: Secondary | ICD-10-CM

## 2021-06-30 LAB — COMPREHENSIVE METABOLIC PANEL
ALT: 11 U/L (ref 0–44)
AST: 20 U/L (ref 15–41)
Albumin: 2.9 g/dL — ABNORMAL LOW (ref 3.5–5.0)
Alkaline Phosphatase: 62 U/L (ref 38–126)
Anion gap: 7 (ref 5–15)
BUN: 22 mg/dL (ref 8–23)
CO2: 27 mmol/L (ref 22–32)
Calcium: 8.2 mg/dL — ABNORMAL LOW (ref 8.9–10.3)
Chloride: 103 mmol/L (ref 98–111)
Creatinine, Ser: 0.75 mg/dL (ref 0.44–1.00)
GFR, Estimated: >60 mL/min (ref >60)
Glucose, Bld: 90 mg/dL (ref 70–99)
Potassium: 3.8 mmol/L (ref 3.5–5.1)
Sodium: 137 mmol/L (ref 135–145)
Total Bilirubin: 0.5 mg/dL (ref 0.3–1.2)
Total Protein: 6.1 g/dL — ABNORMAL LOW (ref 6.5–8.1)

## 2021-06-30 LAB — CBC WITH DIFFERENTIAL/PLATELET
Abs Immature Granulocytes: 0.06 10*3/uL (ref 0.00–0.07)
Basophils Absolute: 0 10*3/uL (ref 0.0–0.1)
Basophils Relative: 0 %
Eosinophils Absolute: 0.1 10*3/uL (ref 0.0–0.5)
Eosinophils Relative: 1 %
HCT: 36.9 % (ref 36.0–46.0)
Hemoglobin: 11.8 g/dL — ABNORMAL LOW (ref 12.0–15.0)
Immature Granulocytes: 1 %
Lymphocytes Relative: 35 %
Lymphs Abs: 3.1 10*3/uL (ref 0.7–4.0)
MCH: 26.2 pg (ref 26.0–34.0)
MCHC: 32 g/dL (ref 30.0–36.0)
MCV: 82 fL (ref 80.0–100.0)
Monocytes Absolute: 0.7 10*3/uL (ref 0.1–1.0)
Monocytes Relative: 8 %
Neutro Abs: 4.8 10*3/uL (ref 1.7–7.7)
Neutrophils Relative %: 55 %
Platelets: 267 10*3/uL (ref 150–400)
RBC: 4.5 MIL/uL (ref 3.87–5.11)
RDW: 16.5 % — ABNORMAL HIGH (ref 11.5–15.5)
WBC: 8.8 10*3/uL (ref 4.0–10.5)
nRBC: 0 % (ref 0.0–0.2)

## 2021-06-30 IMAGING — CT CT ABD-PELV W/ CM
2 of 5 series · 15 of 46 positions shown, 17 images · IV contrast (APPLIED)
Comparison: CT abdomen pelvis dated [DATE] pain

CLINICAL DATA: Abdominal pain.

EXAM:
CT ABDOMEN AND PELVIS WITH CONTRAST
TECHNIQUE: Multidetector CT imaging of the abdomen and pelvis was performed
using the standard protocol following bolus administration of
intravenous contrast.
CONTRAST:  75mL OMNIPAQUE IOHEXOL 350 MG/ML SOLN

[Series 7: coronal soft tissue · coronal · 0.81mm/px · 3 of 104 slices shown]
[im 35/104  soft-tissue]
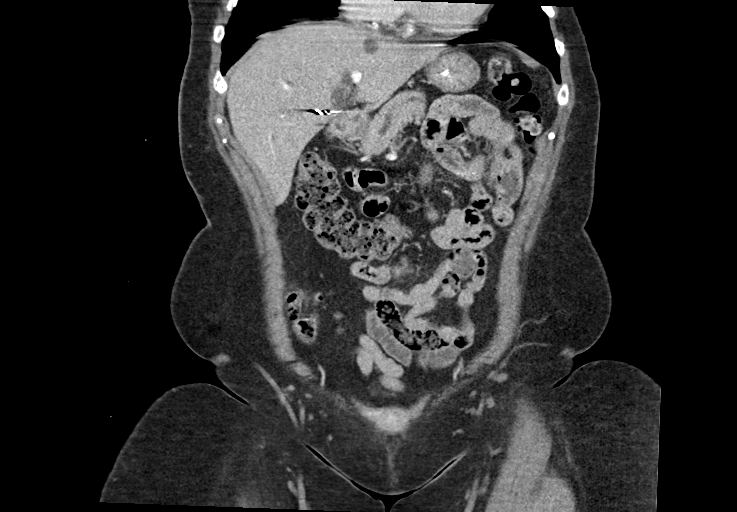
[im 46/104  soft-tissue]
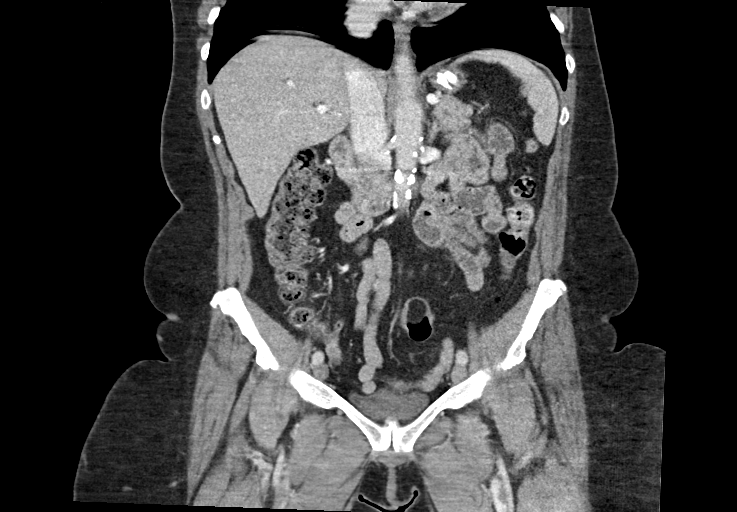
[im 58/104  soft-tissue]
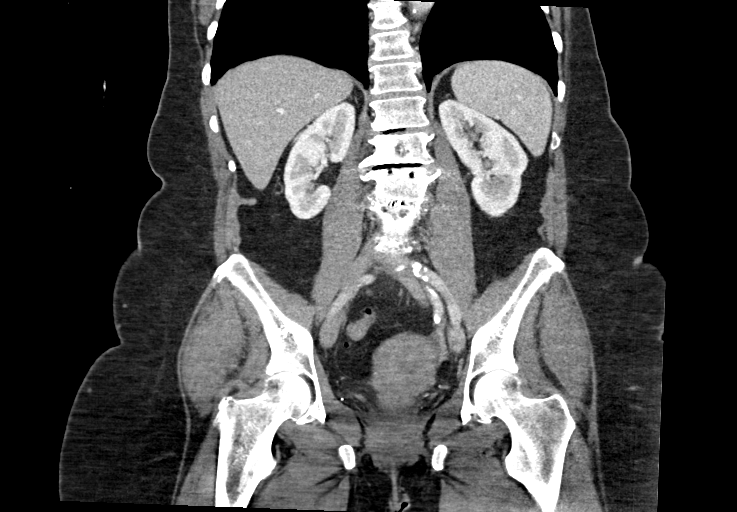

[Series 9: abd/ pelvis 5.0 i30f 2 1 (person_name) · axial · 0.98mm/px · z∈[-584,-219]mm · 12 of 83 slices shown, 14 images]
[im 5/83  soft-tissue]
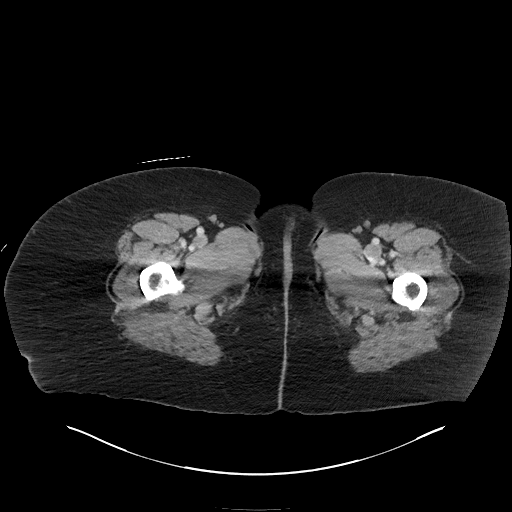
[im 5/83  bone]
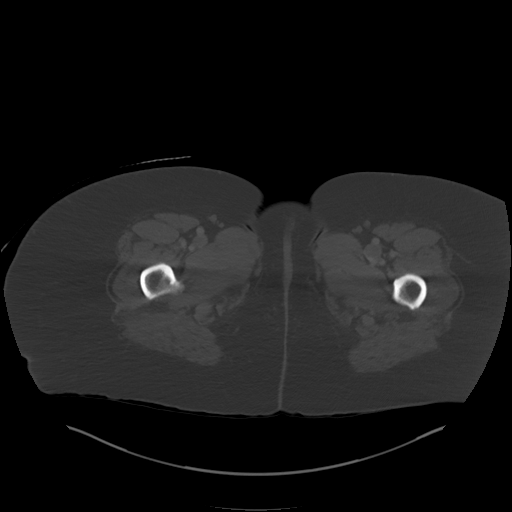
[im 13/83  soft-tissue]
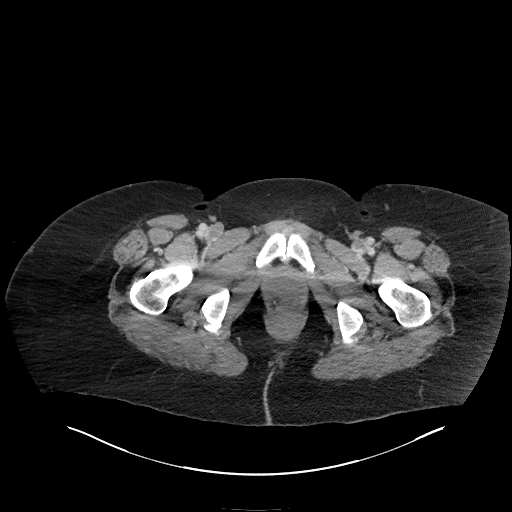
[im 17/83  soft-tissue]
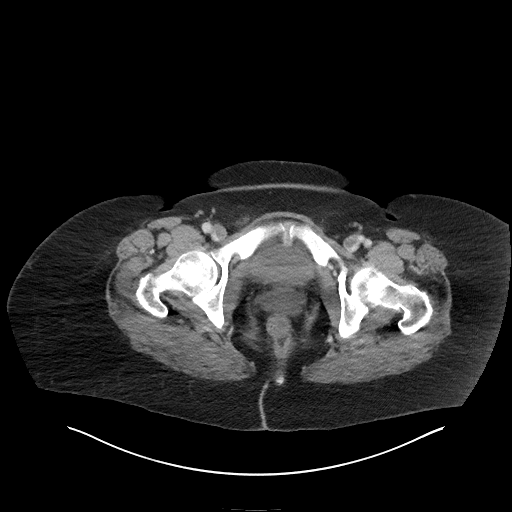
[im 25/83  soft-tissue]
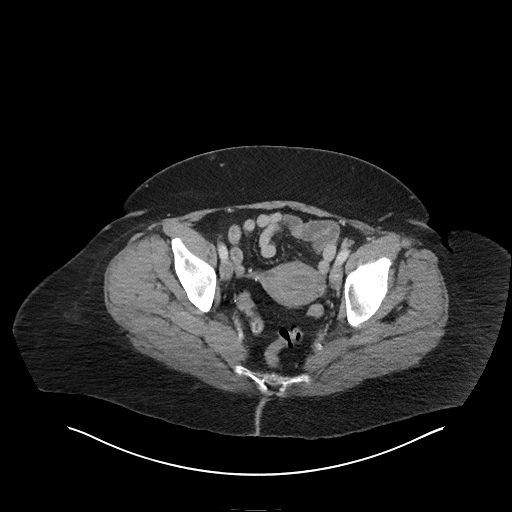
[im 33/83  soft-tissue]
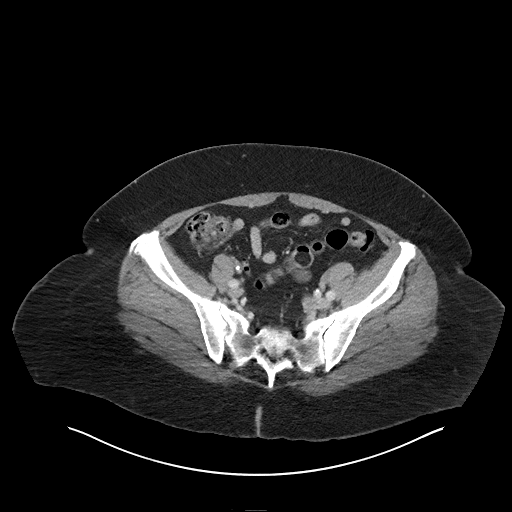
[im 37/83  soft-tissue]
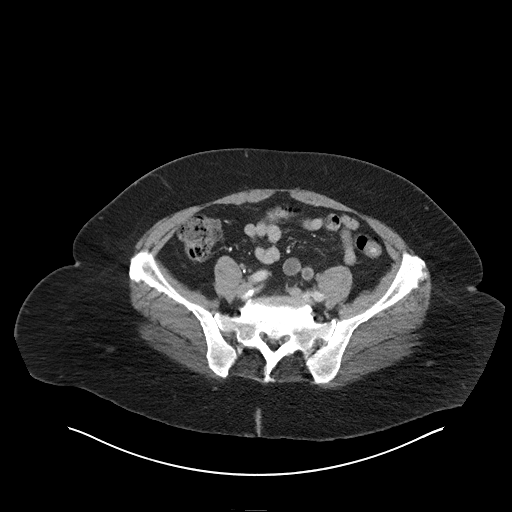
[im 46/83  soft-tissue]
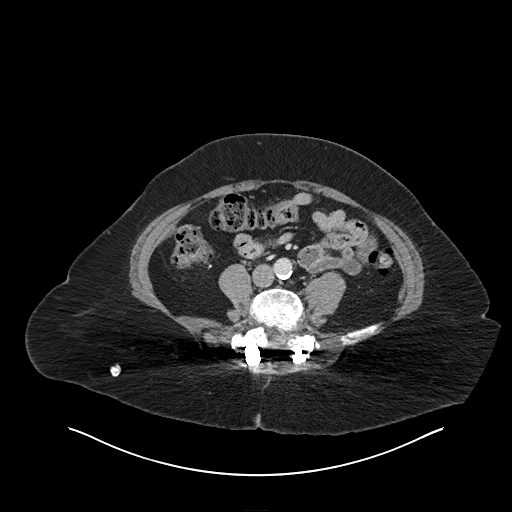
[im 50/83  soft-tissue]
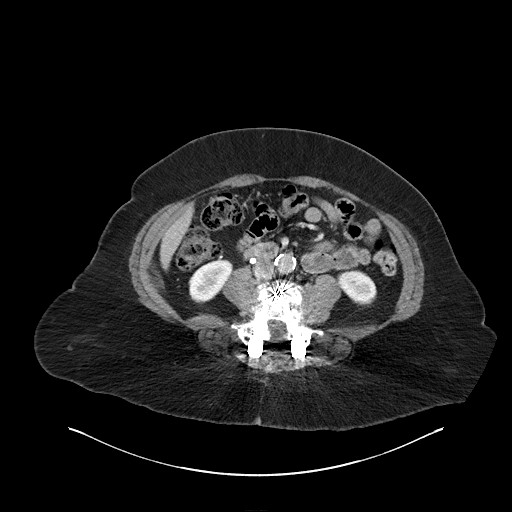
[im 58/83  soft-tissue]
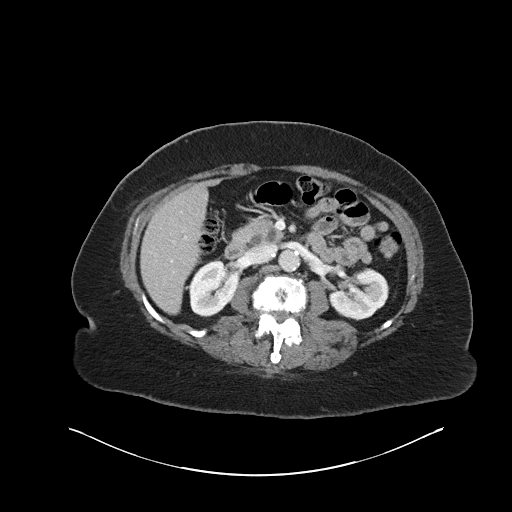
[im 58/83  bone]
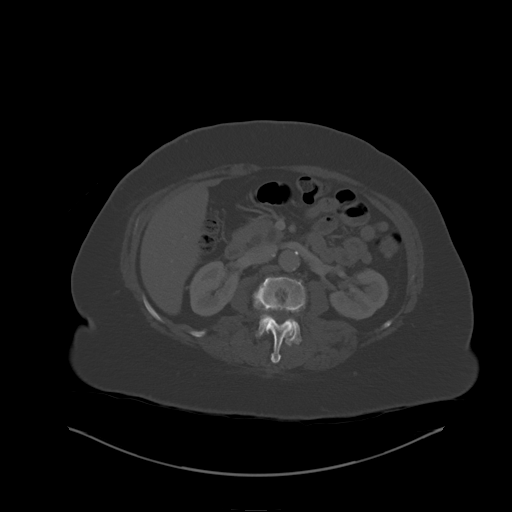
[im 66/83  soft-tissue]
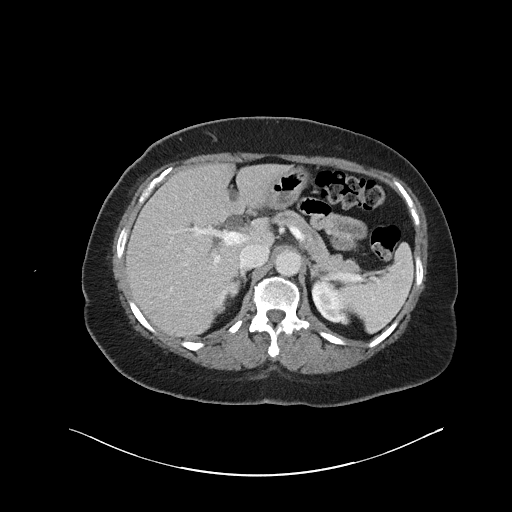
[im 70/83  soft-tissue]
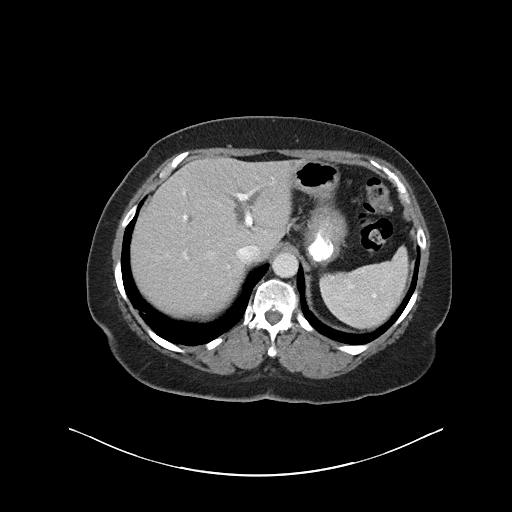
[im 78/83  soft-tissue]
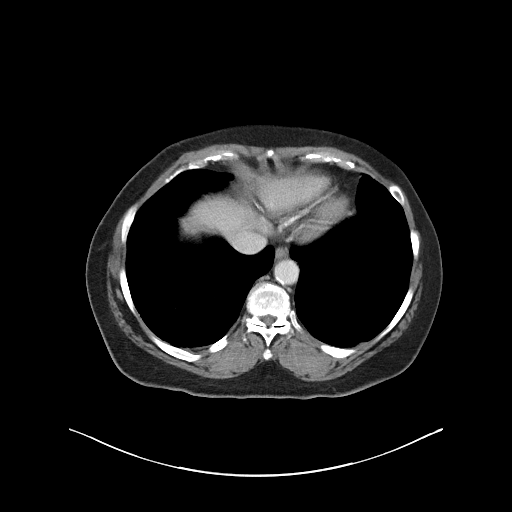

[15 of 46 positions shown; findings below may reference images not displayed]

FINDINGS: Lower chest: The visualized lung bases are clear. There is coronary
vascular calcification.

No intra-abdominal free air or free fluid.

Hepatobiliary: There is a 17 mm cyst in the dome of the liver.
Several additional subcentimeter hepatic hypodense lesions are too
small to characterize. There is dilatation the common bile duct and
intrahepatic biliary tree, likely post cholecystectomy. No retained
calcified stone noted in the central CBD.

Pancreas: Unremarkable. No pancreatic ductal dilatation or
surrounding inflammatory changes.

Spleen: Normal in size without focal abnormality.

Adrenals/Urinary Tract: The adrenal glands unremarkable. There is no
hydronephrosis on either side. There is symmetric enhancement and
excretion of contrast by both kidneys. Subcentimeter bilateral renal
hypodense lesions are too small to characterize. The visualized
ureters appear unremarkable. The urinary bladder is collapsed.

Stomach/Bowel: There is moderate stool throughout the colon. There
is no bowel obstruction or active inflammation. The appendix is
normal.

Vascular/Lymphatic: Moderate aortoiliac atherosclerotic disease. The
IVC is unremarkable. No portal venous gas. There is no adenopathy.

Reproductive: The uterus and ovaries are grossly unremarkable. No
adnexal masses.

Other: None

Musculoskeletal: Degenerative changes of the spine. Lower lumbar
laminectomy and posterior fusion. No acute osseous pathology.
IMPRESSION: 1. No acute intra-abdominal or pelvic pathology.
2. Moderate colonic stool burden. No bowel obstruction. Normal
appendix.
3. Aortic Atherosclerosis ([8M]-[8M]).

## 2021-06-30 IMAGING — XA DG FACET JT INJ L OR S SPINE SINGLE LEVEL UNI
3 series · 3 of 3 positions shown · non-contrast
Comparison: CT done yesterday.

CLINICAL DATA: Multifactorial spinal stenosis superior to the
lumbar fusion. Facet arthropathy at L5-S1. Low back pain with
mechanical symptoms related to that.

EXAM:
Bilateral L5-S1 facet injection

[Series 1: ortho standard · 1 of 1 slices shown (1 of 3)]
[im 1/1]
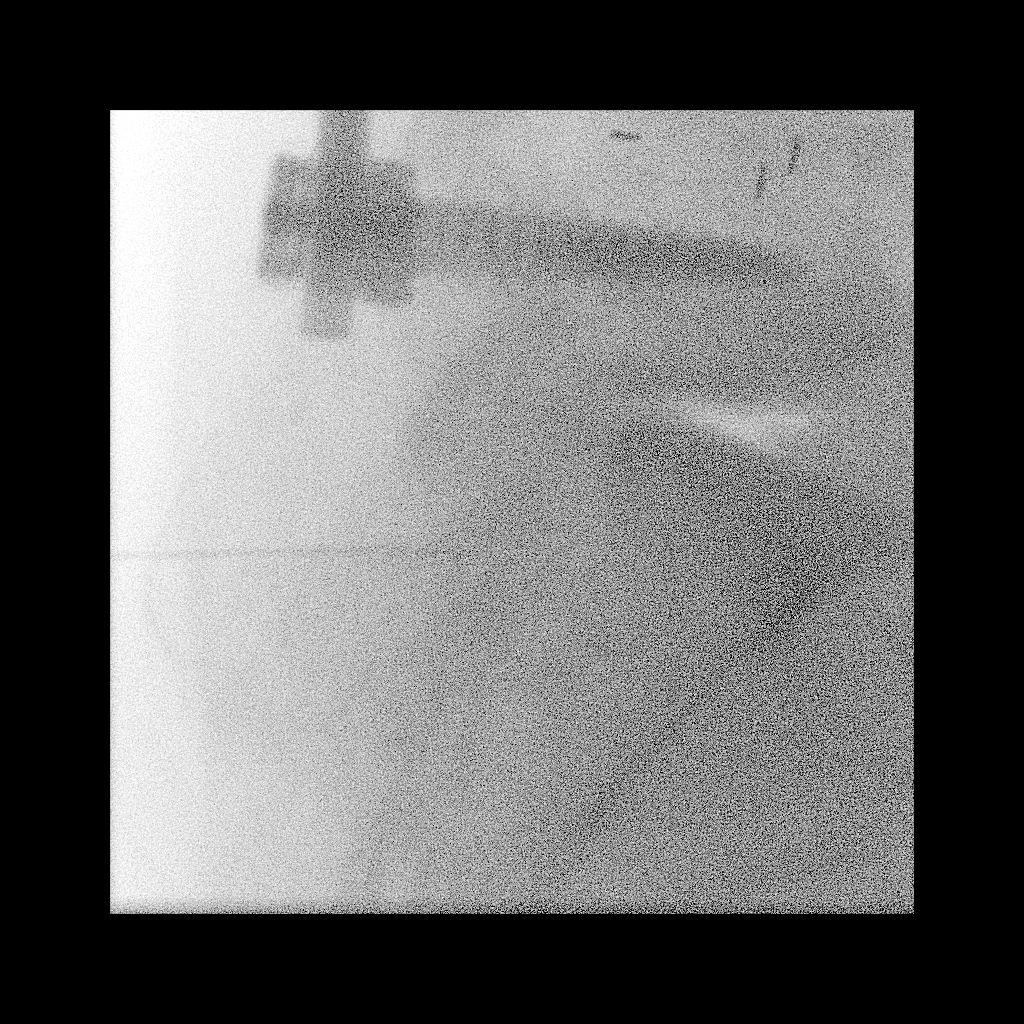

[Series 2: ortho standard · 1 of 1 slices shown (2 of 3)]
[im 1/1]
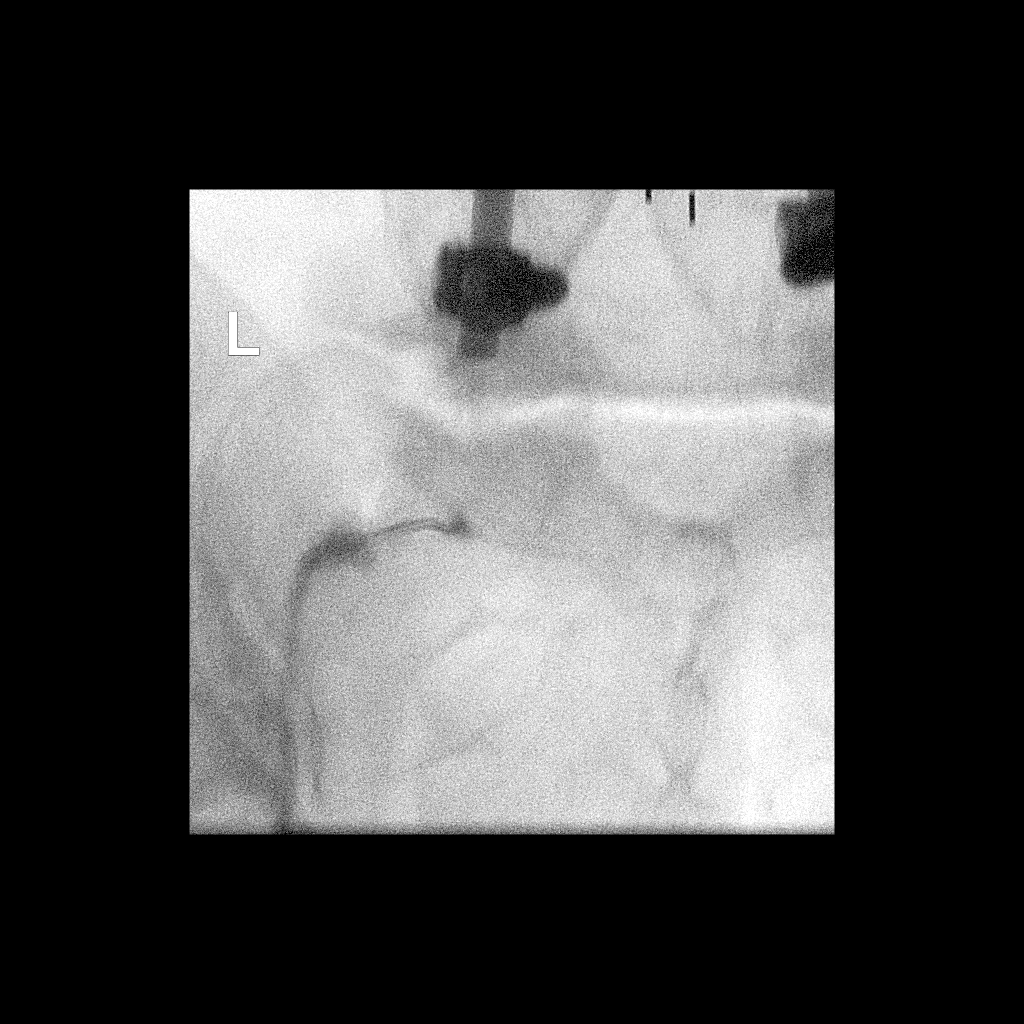

[Series 3: ortho standard · 1 of 1 slices shown (3 of 3)]
[im 1/1]
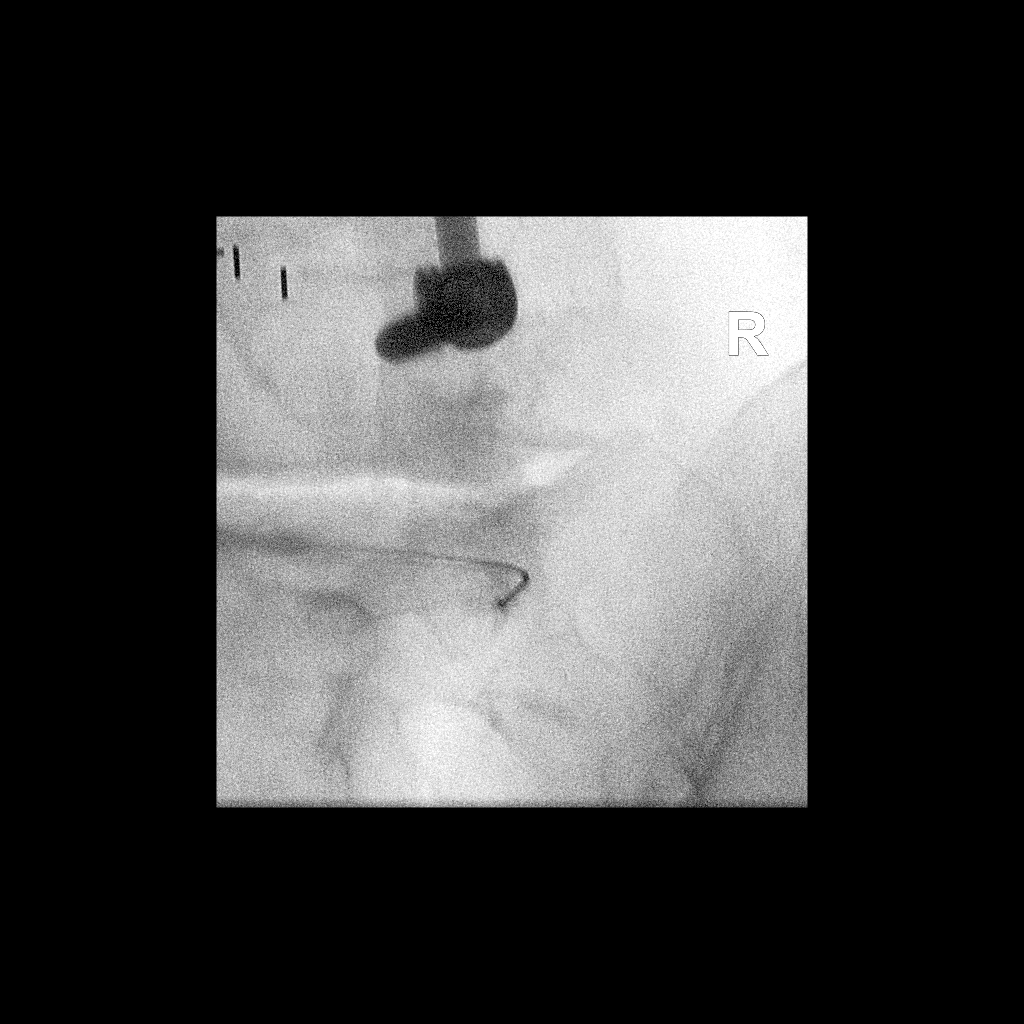

[3 of 3 positions shown; findings below may reference images not displayed]

PROCEDURE:
The procedure, risks, benefits, and alternatives were explained to
the patient. Questions regarding the procedure were encouraged and
answered. The patient understands and consents to the procedure.

Posterior oblique approaches were taken to the facets on both sides
at L5-S1 using curved 22 gauge spinal needles. Direct coronal
approach necessary because of coronal orientation of the joints.
Intra-articular positioning was confirmed by injecting a small
amount of Isovue-M 200. No vascular opacification is seen. Forty mg
of Depo-Medrol mixed with 1 cc 1% lidocaine were instilled into each
joint. The injection seemed to result in concordant pain. The
procedure was well-tolerated.

FLUOROSCOPY TIME:  0 minutes 59 seconds. 63.51 micro gray meter
squared
IMPRESSION: Technically successful [F5]-[F5] injection .

## 2021-06-30 IMAGING — XA DG FACET JT INJ L OR S SPINE SINGLE LEVEL UNI
3 series · 3 of 3 positions shown · non-contrast
Comparison: CT done yesterday.

CLINICAL DATA: Multifactorial spinal stenosis superior to the
lumbar fusion. Facet arthropathy at L5-S1. Low back pain with
mechanical symptoms related to that.

EXAM:
Bilateral L5-S1 facet injection

[Series 1: ortho standard · 1 of 1 slices shown (1 of 3)]
[im 1/1]
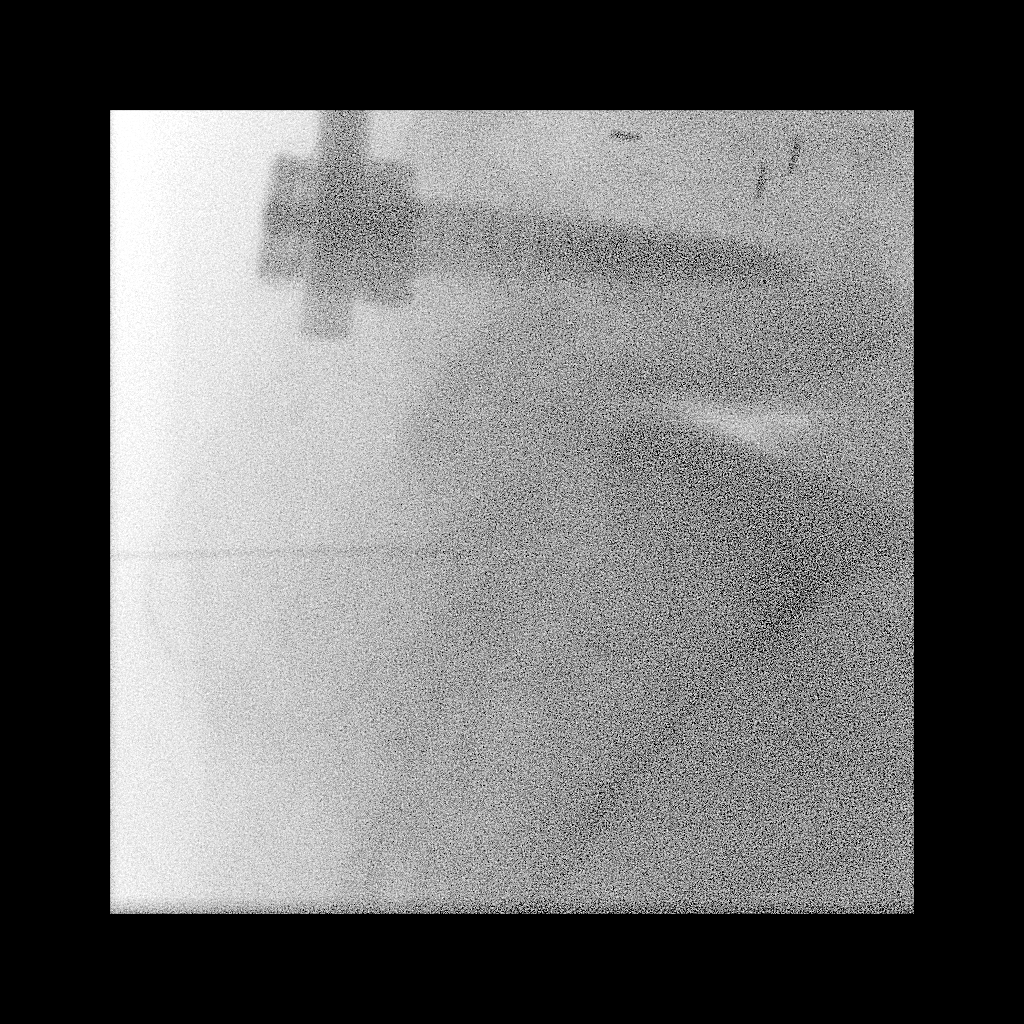

[Series 2: ortho standard · 1 of 1 slices shown (2 of 3)]
[im 1/1]
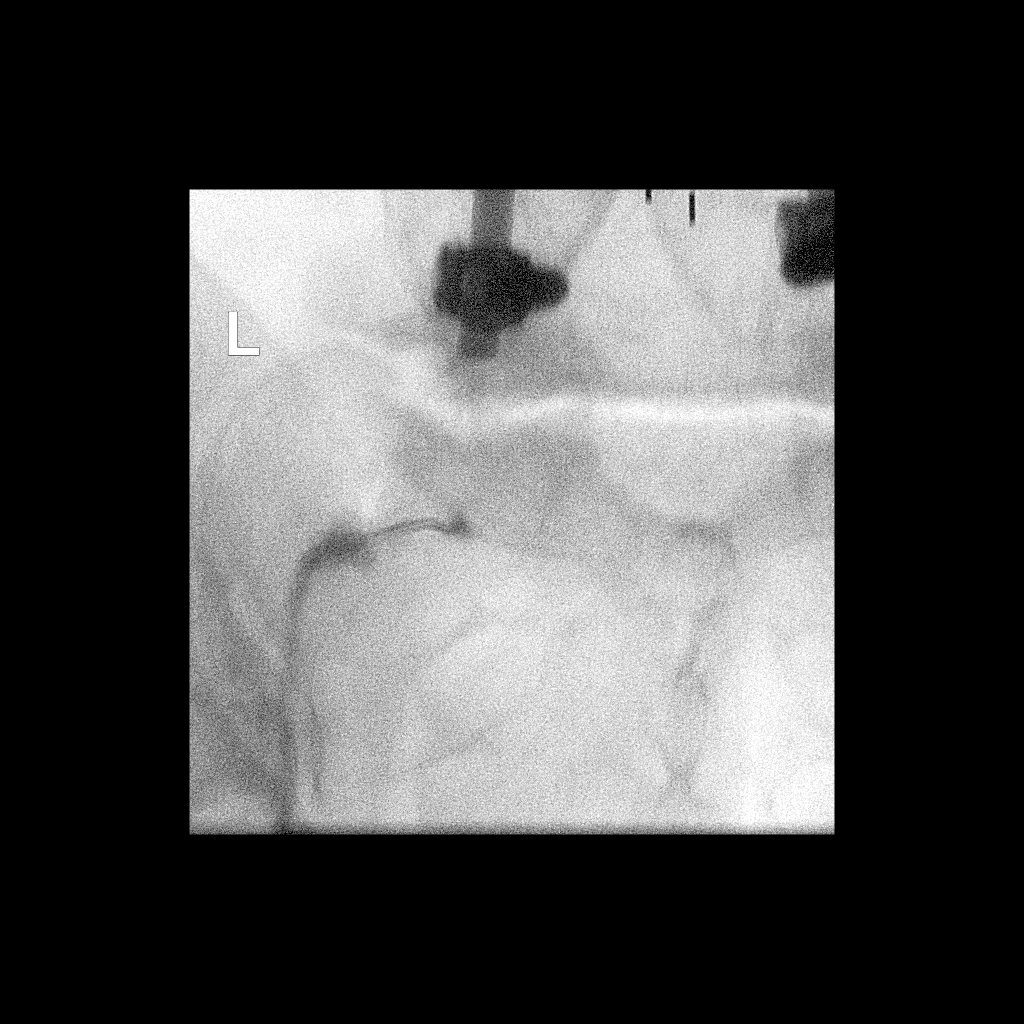

[Series 3: ortho standard · 1 of 1 slices shown (3 of 3)]
[im 1/1]
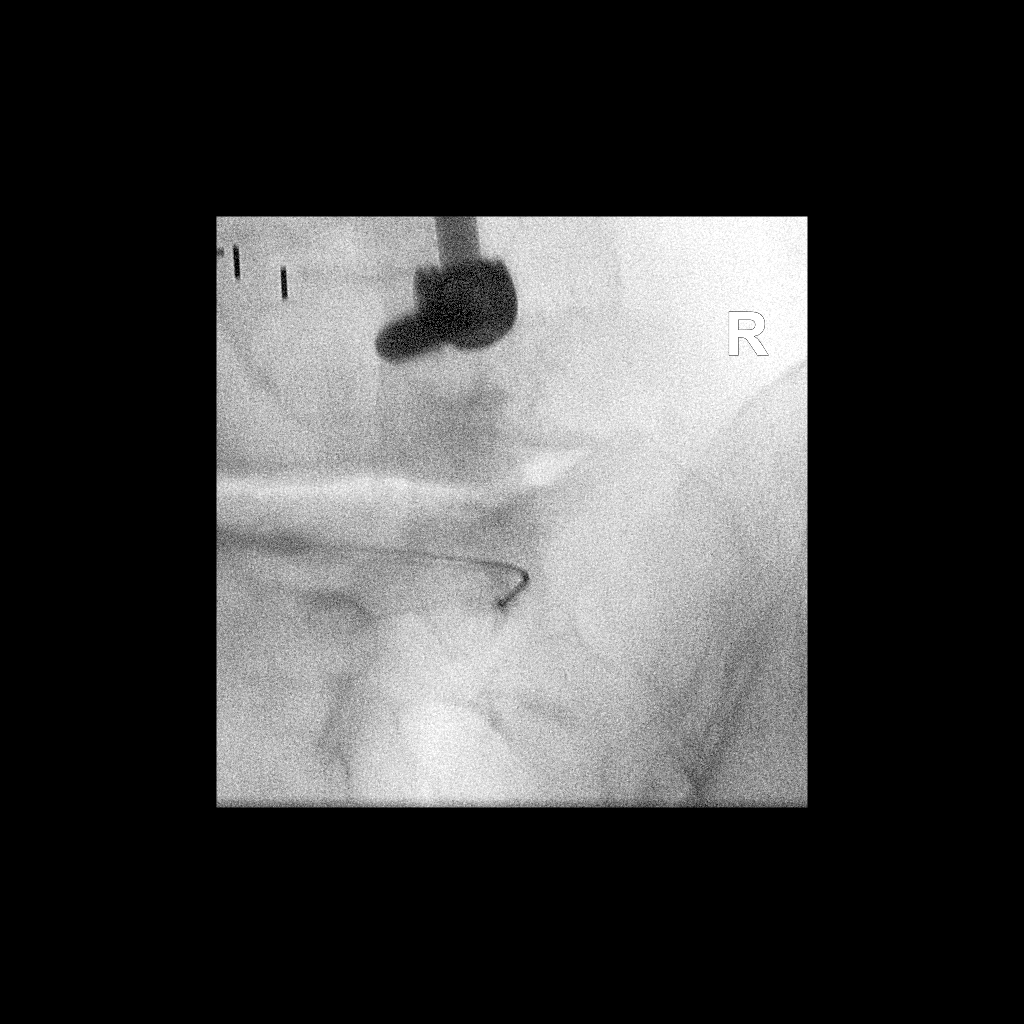

[3 of 3 positions shown; findings below may reference images not displayed]

PROCEDURE:
The procedure, risks, benefits, and alternatives were explained to
the patient. Questions regarding the procedure were encouraged and
answered. The patient understands and consents to the procedure.

Posterior oblique approaches were taken to the facets on both sides
at L5-S1 using curved 22 gauge spinal needles. Direct coronal
approach necessary because of coronal orientation of the joints.
Intra-articular positioning was confirmed by injecting a small
amount of Isovue-M 200. No vascular opacification is seen. Forty mg
of Depo-Medrol mixed with 1 cc 1% lidocaine were instilled into each
joint. The injection seemed to result in concordant pain. The
procedure was well-tolerated.

FLUOROSCOPY TIME:  0 minutes 59 seconds. 63.51 micro gray meter
squared
IMPRESSION: Technically successful [F5]-[F5] injection .

## 2021-06-30 MED ORDER — IOHEXOL 350 MG/ML SOLN
75.0000 mL | Freq: Once | INTRAVENOUS | Status: AC | PRN
  Administered 2021-06-30: 75 mL via INTRAVENOUS

## 2021-06-30 MED ORDER — METHYLPREDNISOLONE ACETATE 40 MG/ML INJ SUSP (RADIOLOG
80.0000 mg | Freq: Once | INTRAMUSCULAR | Status: AC
  Administered 2021-06-30: 80 mg via INTRA_ARTICULAR

## 2021-06-30 MED ORDER — IOPAMIDOL (ISOVUE-M 200) INJECTION 41%
1.0000 mL | Freq: Once | INTRAMUSCULAR | Status: AC
  Administered 2021-06-30: 1 mL via INTRA_ARTICULAR

## 2021-06-30 MED ORDER — POTASSIUM CHLORIDE CRYS ER 20 MEQ PO TBCR
40.0000 meq | EXTENDED_RELEASE_TABLET | Freq: Once | ORAL | Status: AC
  Administered 2021-06-30: 40 meq via ORAL
  Filled 2021-06-30: qty 2

## 2021-06-30 MED ORDER — PANTOPRAZOLE SODIUM 40 MG IV SOLR
40.0000 mg | Freq: Once | INTRAVENOUS | Status: AC
  Administered 2021-06-30: 40 mg via INTRAVENOUS
  Filled 2021-06-30: qty 40

## 2021-06-30 NOTE — Discharge Instructions (Addendum)
1. Medications: continue omeprazole, usual home medications 2. Treatment: rest, drink plenty of fluids, advance diet slowly 3. Follow Up: Please followup with your primary doctor in 2 days for discussion of your diagnoses and further evaluation after today's visit; if you do not have a primary care doctor use the resource guide provided to find one; Please return to the ER for persistent vomiting, high fevers or worsening symptoms

## 2021-06-30 NOTE — Discharge Instructions (Signed)

## 2021-06-30 NOTE — ED Provider Notes (Signed)
Care assumed from [facility].  Please see her full H&P.  In short,  Candice Maldonado is a 64 y.o. female presents for generalized abd pain.  Initial labs with mild white count.  Repeat labs are reassuring.  Patient does have a history of breast cancer currently in remission.  Reports her last bowel movement was 3 days ago and soft.  Denies hematochezia, bright red blood per rectum, melena.  1 episode of nonbloody nonbilious emesis in the waiting room.  No additional episodes.  Patient reports she feels significantly better at this time.  She does continue to have some mild abdominal pain.  Previous surgical history consists of cholecystectomy.   Physical Exam  BP 117/68   Pulse 69   Temp 98.6 F (37 C) (Oral)   Resp 19   Ht 5\' 3"  (1.6 m)   Wt 98.4 kg   SpO2 100%   BMI 38.44 kg/m   Physical Exam Vitals and nursing note reviewed.  Constitutional:      General: She is not in acute distress.    Appearance: She is well-developed. She is not ill-appearing.  HENT:     Head: Normocephalic.  Eyes:     General: No scleral icterus.    Conjunctiva/sclera: Conjunctivae normal.  Cardiovascular:     Rate and Rhythm: Normal rate.  Pulmonary:     Effort: Pulmonary effort is normal.  Abdominal:     General: There is no distension.     Tenderness: There is generalized abdominal tenderness. There is no right CVA tenderness, left CVA tenderness, guarding or rebound.  Musculoskeletal:        General: Normal range of motion.     Cervical back: Normal range of motion.  Skin:    General: Skin is warm and dry.  Neurological:     Mental Status: She is alert.  Psychiatric:        Mood and Affect: Mood normal.    ED Course/Procedures   Clinical Course as of 06/30/21 0310  Thu Sep 22, 698  [id number] 66 year old female with complaint of abdominal pain with x episode of vomiting as above. On exam, appears uncomfortable, has periumbilical tenderness.  Labs reviewed, normal WBC, CMP with mild  hypokalemia at 3.3, will replace orally. IV fluids for mildly elevated Cr at 1.03. Plan is to redraw labs (drawn on arrival 13 hours earlier), CT abdomen/pelvis with contrast. Given morphine/zofran. Care signed out pending repeat labs and CT. [LM]    Clinical Course User Index [LM] Tacy Learn, PA-C    Results for orders placed or performed during the hospital encounter of 06/29/21  Lipase, blood  Result Value Ref Range   Lipase 34 11 - 51 U/L  Comprehensive metabolic panel  Result Value Ref Range   Sodium 138 135 - 145 mmol/L   Potassium 3.3 (L) 3.5 - 5.1 mmol/L   Chloride 102 98 - 111 mmol/L   CO2 25 22 - 32 mmol/L   Glucose, Bld 110 (H) 70 - 99 mg/dL   BUN 27 (H) 8 - 23 mg/dL   Creatinine, Ser 1.03 (H) 0.44 - 1.00 mg/dL   Calcium 8.9 8.9 - 10.3 mg/dL   Total Protein 6.8 6.5 - 8.1 g/dL   Albumin 3.3 (L) 3.5 - 5.0 g/dL   AST 18 15 - 41 U/L   ALT 16 0 - 44 U/L   Alkaline Phosphatase 75 38 - 126 U/L   Total Bilirubin 0.5 0.3 - 1.2 mg/dL  GFR, Estimated 60 (L) >60 mL/min   Anion gap 11 5 - 15  CBC  Result Value Ref Range   WBC 10.9 (H) 4.0 - 10.5 K/uL   RBC 4.88 3.87 - 5.11 MIL/uL   Hemoglobin 12.3 12.0 - 15.0 g/dL   HCT 40.1 36.0 - 46.0 %   MCV 82.2 80.0 - 100.0 fL   MCH 25.2 (L) 26.0 - 34.0 pg   MCHC 30.7 30.0 - 36.0 g/dL   RDW 16.4 (H) 11.5 - 15.5 %   Platelets 333 150 - 400 K/uL   nRBC 0.2 0.0 - 0.2 %  Urinalysis, Routine w reflex microscopic Urine, Clean Catch  Result Value Ref Range   Color, Urine YELLOW YELLOW   APPearance CLEAR CLEAR   Specific Gravity, Urine 1.025 1.005 - 1.030   pH 6.0 5.0 - 8.0   Glucose, UA NEGATIVE NEGATIVE mg/dL   Hgb urine dipstick NEGATIVE NEGATIVE   Bilirubin Urine NEGATIVE NEGATIVE   Ketones, ur NEGATIVE NEGATIVE mg/dL   Protein, ur NEGATIVE NEGATIVE mg/dL   Nitrite NEGATIVE NEGATIVE   Leukocytes,Ua TRACE (A) NEGATIVE  Urinalysis, Microscopic (reflex)  Result Value Ref Range   RBC / HPF 0-5 0 - 5 RBC/hpf   WBC, UA 0-5 0 -  5 WBC/hpf   Bacteria, UA NONE SEEN NONE SEEN   Squamous Epithelial / LPF 0-5 0 - 5   Mucus PRESENT   CBC with Differential  Result Value Ref Range   WBC 8.8 4.0 - 10.5 K/uL   RBC 4.50 3.87 - 5.11 MIL/uL   Hemoglobin 11.8 (L) 12.0 - 15.0 g/dL   HCT 36.9 36.0 - 46.0 %   MCV 82.0 80.0 - 100.0 fL   MCH 26.2 26.0 - 34.0 pg   MCHC 32.0 30.0 - 36.0 g/dL   RDW 16.5 (H) 11.5 - 15.5 %   Platelets 267 150 - 400 K/uL   nRBC 0.0 0.0 - 0.2 %   Neutrophils Relative % 55 %   Neutro Abs 4.8 1.7 - 7.7 K/uL   Lymphocytes Relative 35 %   Lymphs Abs 3.1 0.7 - 4.0 K/uL   Monocytes Relative 8 %   Monocytes Absolute 0.7 0.1 - 1.0 K/uL   Eosinophils Relative 1 %   Eosinophils Absolute 0.1 0.0 - 0.5 K/uL   Basophils Relative 0 %   Basophils Absolute 0.0 0.0 - 0.1 K/uL   Immature Granulocytes 1 %   Abs Immature Granulocytes 0.06 0.00 - 0.07 K/uL  Comprehensive metabolic panel  Result Value Ref Range   Sodium 137 135 - 145 mmol/L   Potassium 3.8 3.5 - 5.1 mmol/L   Chloride 103 98 - 111 mmol/L   CO2 27 22 - 32 mmol/L   Glucose, Bld 90 70 - 99 mg/dL   BUN 22 8 - 23 mg/dL   Creatinine, Ser 0.75 0.44 - 1.00 mg/dL   Calcium 8.2 (L) 8.9 - 10.3 mg/dL   Total Protein 6.1 (L) 6.5 - 8.1 g/dL   Albumin 2.9 (L) 3.5 - 5.0 g/dL   AST 20 15 - 41 U/L   ALT 11 0 - 44 U/L   Alkaline Phosphatase 62 38 - 126 U/L   Total Bilirubin 0.5 0.3 - 1.2 mg/dL   GFR, Estimated >60 >60 mL/min   Anion gap 7 5 - 15   CT Abdomen Pelvis W Contrast  Result Date: 06/30/2021 CLINICAL DATA:  Abdominal pain. EXAM: CT ABDOMEN AND PELVIS WITH CONTRAST TECHNIQUE: Multidetector CT imaging of the abdomen and  pelvis was performed using the standard protocol following bolus administration of intravenous contrast. CONTRAST:  57mL OMNIPAQUE IOHEXOL 350 MG/ML SOLN COMPARISON:  CT abdomen pelvis dated 01/08/2020 pain FINDINGS: Lower chest: The visualized lung bases are clear. There is coronary vascular calcification. No intra-abdominal free air  or free fluid. Hepatobiliary: There is a 17 mm cyst in the dome of the liver. Several additional subcentimeter hepatic hypodense lesions are too small to characterize. There is dilatation the common bile duct and intrahepatic biliary tree, likely post cholecystectomy. No retained calcified stone noted in the central CBD. Pancreas: Unremarkable. No pancreatic ductal dilatation or surrounding inflammatory changes. Spleen: Normal in size without focal abnormality. Adrenals/Urinary Tract: The adrenal glands unremarkable. There is no hydronephrosis on either side. There is symmetric enhancement and excretion of contrast by both kidneys. Subcentimeter bilateral renal hypodense lesions are too small to characterize. The visualized ureters appear unremarkable. The urinary bladder is collapsed. Stomach/Bowel: There is moderate stool throughout the colon. There is no bowel obstruction or active inflammation. The appendix is normal. Vascular/Lymphatic: Moderate aortoiliac atherosclerotic disease. The IVC is unremarkable. No portal venous gas. There is no adenopathy. Reproductive: The uterus and ovaries are grossly unremarkable. No adnexal masses. Other: None Musculoskeletal: Degenerative changes of the spine. Lower lumbar laminectomy and posterior fusion. No acute osseous pathology. IMPRESSION: 1. No acute intra-abdominal or pelvic pathology. 2. Moderate colonic stool burden. No bowel obstruction. Normal appendix. 3. Aortic Atherosclerosis (ICD10-I70.0). Electronically Signed   By: Anner Crete M.D.   On: 06/30/2021 01:17     Procedures  MDM   Patient presents with abdominal pain.  On exam mild central abdominal pain but no palpable hernia, rebound or guarding.  Vital signs are within normal limits.  Patient has tolerated p.o. without difficulty.  No additional vomiting or increasing pain.  CT scan without acute abnormality including no evidence of appendicitis and no bowel obstruction.  Will give Protonix for home  use and patient is to have close primary care and GI follow-up.  Discussed reasons to return immediately to the emergency department including worsening pain or other concerns.  Generalized abdominal pain  Non-intractable vomiting with nausea, unspecified vomiting type  Hypokalemia        Agapito Games 06/30/21 1308    Orpah Greek, MD 06/30/21 [phone]

## 2021-08-03 ENCOUNTER — Encounter (HOSPITAL_BASED_OUTPATIENT_CLINIC_OR_DEPARTMENT_OTHER): Payer: Self-pay | Admitting: Obstetrics and Gynecology

## 2021-08-03 ENCOUNTER — Other Ambulatory Visit: Payer: Self-pay

## 2021-08-03 ENCOUNTER — Emergency Department (HOSPITAL_BASED_OUTPATIENT_CLINIC_OR_DEPARTMENT_OTHER)
Admission: EM | Admit: 2021-08-03 | Discharge: 2021-08-03 | Disposition: A | Discharge status: Home / Self Care | Payer: Medicare Other | Attending: Emergency Medicine | Admitting: Emergency Medicine

## 2021-08-03 ENCOUNTER — Emergency Department (HOSPITAL_BASED_OUTPATIENT_CLINIC_OR_DEPARTMENT_OTHER): Payer: Medicare Other

## 2021-08-03 DIAGNOSIS — R1013 Epigastric pain: Secondary | ICD-10-CM | POA: Diagnosis present

## 2021-08-03 LAB — COMPREHENSIVE METABOLIC PANEL
ALT: 9 U/L (ref 0–44)
AST: 17 U/L (ref 15–41)
Albumin: 3.9 g/dL (ref 3.5–5.0)
Alkaline Phosphatase: 67 U/L (ref 38–126)
Anion gap: 8 (ref 5–15)
BUN: 13 mg/dL (ref 8–23)
CO2: 30 mmol/L (ref 22–32)
Calcium: 9.4 mg/dL (ref 8.9–10.3)
Chloride: 102 mmol/L (ref 98–111)
Creatinine, Ser: 0.77 mg/dL (ref 0.44–1.00)
GFR, Estimated: >60 mL/min (ref >60)
Glucose, Bld: 89 mg/dL (ref 70–99)
Potassium: 3.5 mmol/L (ref 3.5–5.1)
Sodium: 140 mmol/L (ref 135–145)
Total Bilirubin: 0.4 mg/dL (ref 0.3–1.2)
Total Protein: 7.2 g/dL (ref 6.5–8.1)

## 2021-08-03 LAB — CBC
HCT: 37.5 % (ref 36.0–46.0)
Hemoglobin: 11.5 g/dL — ABNORMAL LOW (ref 12.0–15.0)
MCH: 25.2 pg — ABNORMAL LOW (ref 26.0–34.0)
MCHC: 30.7 g/dL (ref 30.0–36.0)
MCV: 82.2 fL (ref 80.0–100.0)
Platelets: 276 10*3/uL (ref 150–400)
RBC: 4.56 MIL/uL (ref 3.87–5.11)
RDW: 15.9 % — ABNORMAL HIGH (ref 11.5–15.5)
WBC: 7.1 10*3/uL (ref 4.0–10.5)
nRBC: 0 % (ref 0.0–0.2)

## 2021-08-03 LAB — URINALYSIS, ROUTINE W REFLEX MICROSCOPIC
Bilirubin Urine: NEGATIVE
Glucose, UA: NEGATIVE mg/dL
Ketones, ur: NEGATIVE mg/dL
Leukocytes,Ua: NEGATIVE
Nitrite: NEGATIVE
Protein, ur: NEGATIVE mg/dL
Specific Gravity, Urine: 1.01 (ref 1.005–1.030)
pH: 6 (ref 5.0–8.0)

## 2021-08-03 LAB — LIPASE, BLOOD: Lipase: 24 U/L (ref 11–51)

## 2021-08-03 LAB — TROPONIN I (HIGH SENSITIVITY): Troponin I (High Sensitivity): 3 ng/L (ref <18)

## 2021-08-03 IMAGING — CT CT ANGIO CHEST-ABD-PELV FOR DISSECTION W/ AND WO/W CM
2 of 7 series · 14 of 46 positions shown, 16 images · non-contrast
Comparison: CT abdomen/pelvis [DATE]

CLINICAL DATA: Chest and abdominal pain.

EXAM:
CT ANGIOGRAPHY CHEST, ABDOMEN AND PELVIS
TECHNIQUE: Non-contrast CT of the chest was initially obtained.

[Series 5: axial arterial (person_name) · axial · arterial · 0.84mm/px · z∈[+698,+1232]mm · 11 of 299 slices shown, 13 images]
[im 16/299  soft-tissue]
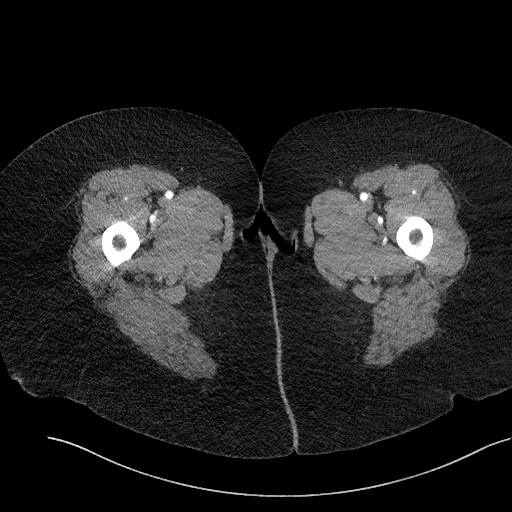
[im 16/299  bone]
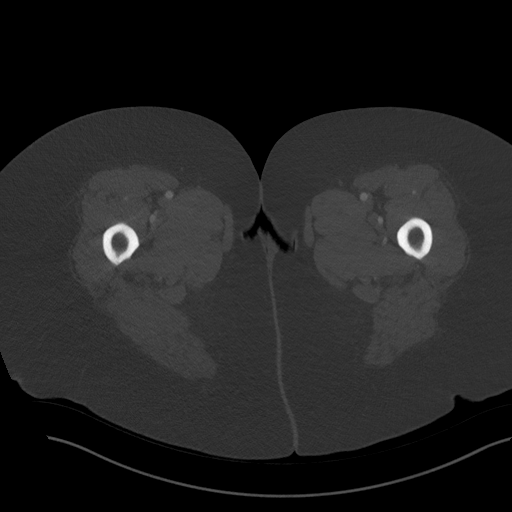
[im 48/299  soft-tissue]
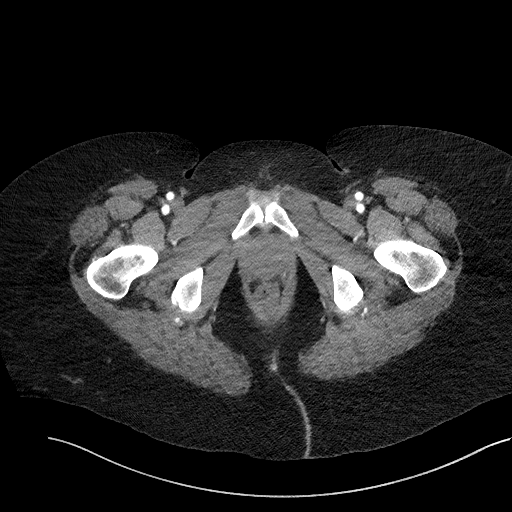
[im 79/299  soft-tissue]
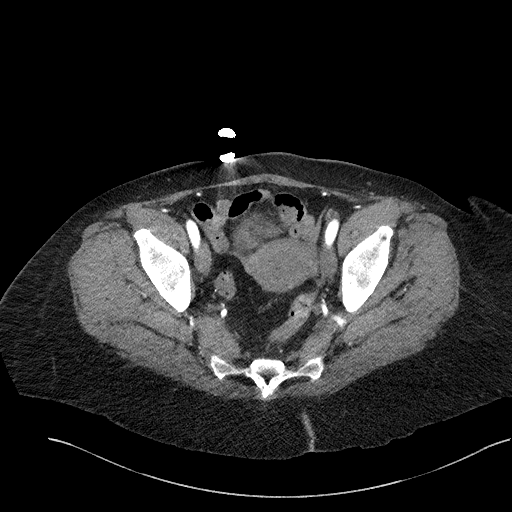
[im 95/299  soft-tissue]
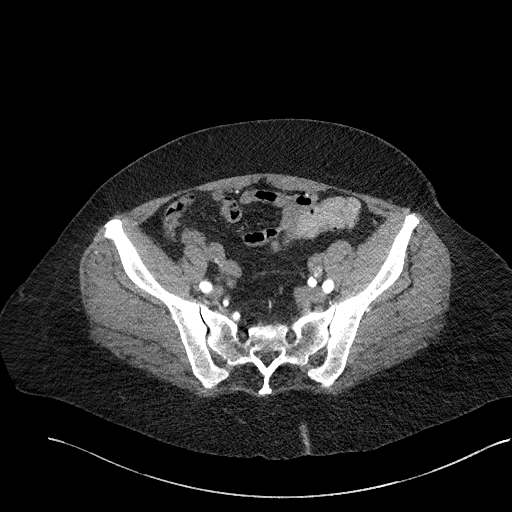
[im 126/299  soft-tissue]
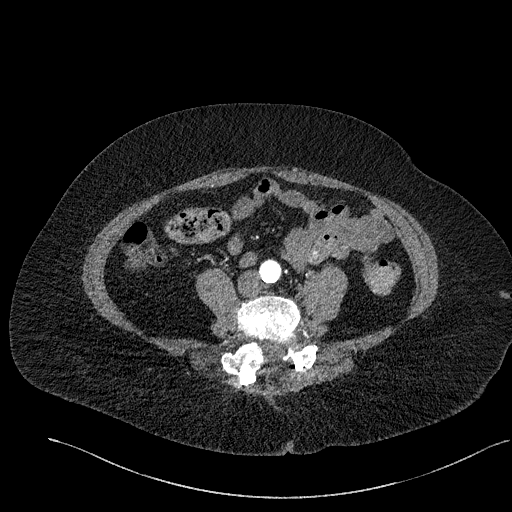
[im 157/299  soft-tissue]
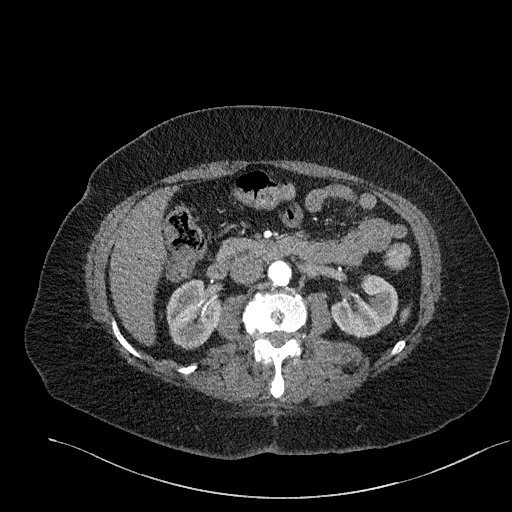
[im 173/299  soft-tissue]
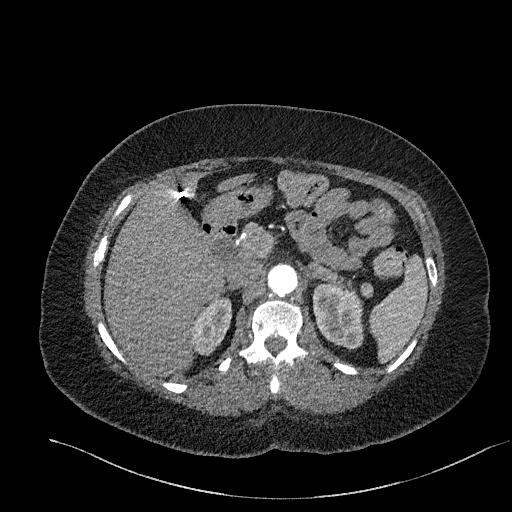
[im 204/299  soft-tissue]
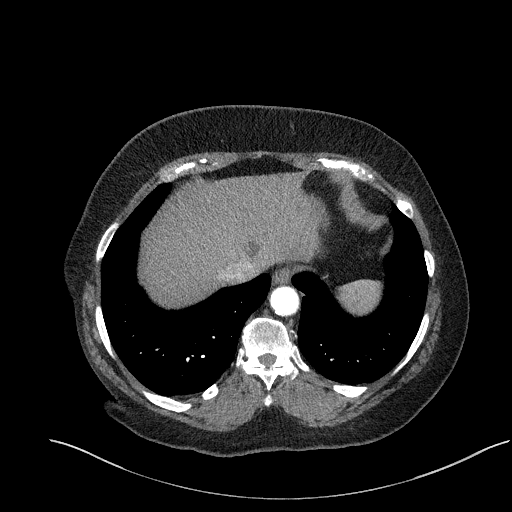
[im 220/299  soft-tissue]
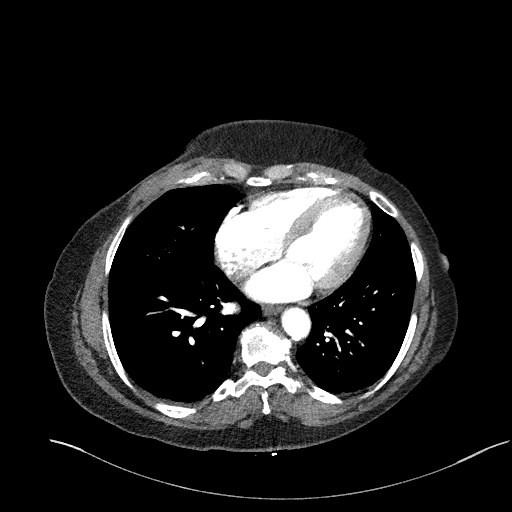
[im 220/299  bone]
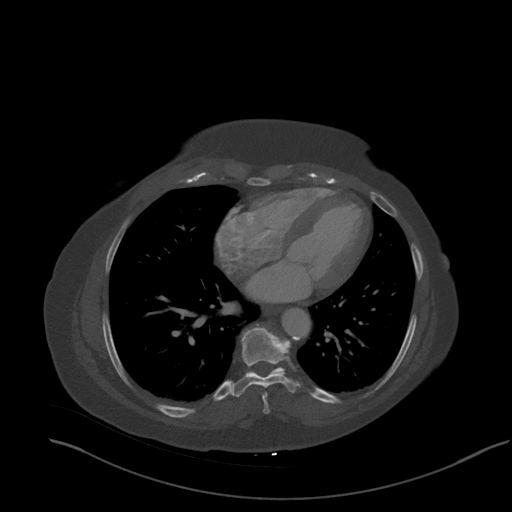
[im 251/299  soft-tissue]
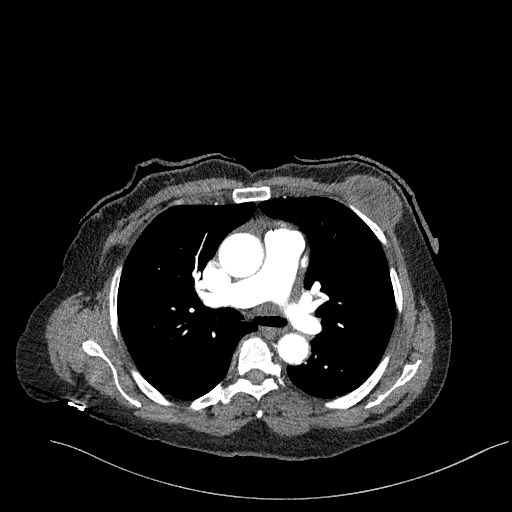
[im 283/299  soft-tissue]
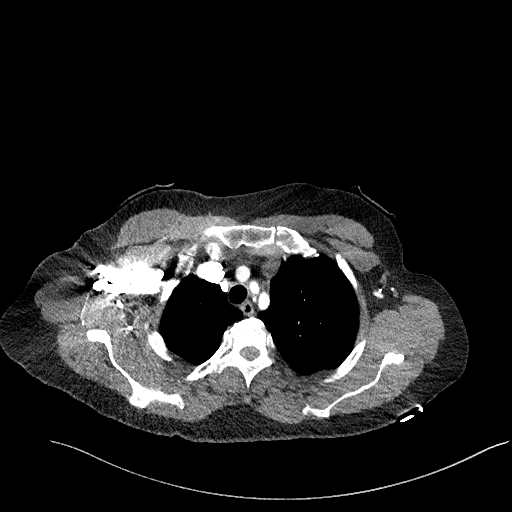

[Series 8: cor soft (person_name) · coronal · 0.98mm/px · 3 of 155 slices shown]
[im 39/155  soft-tissue]
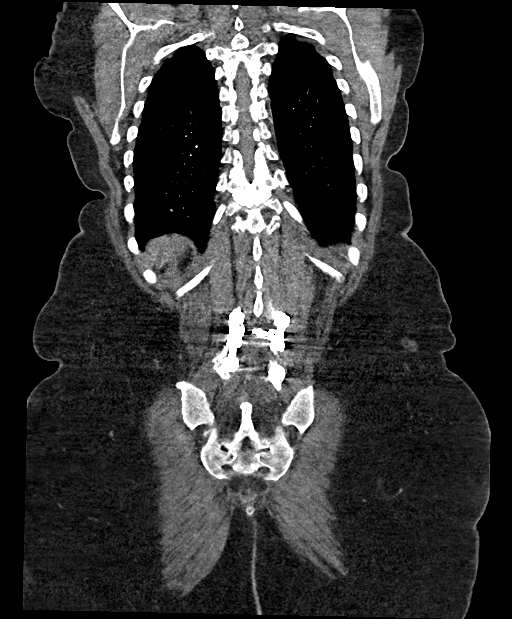
[im 78/155  soft-tissue]
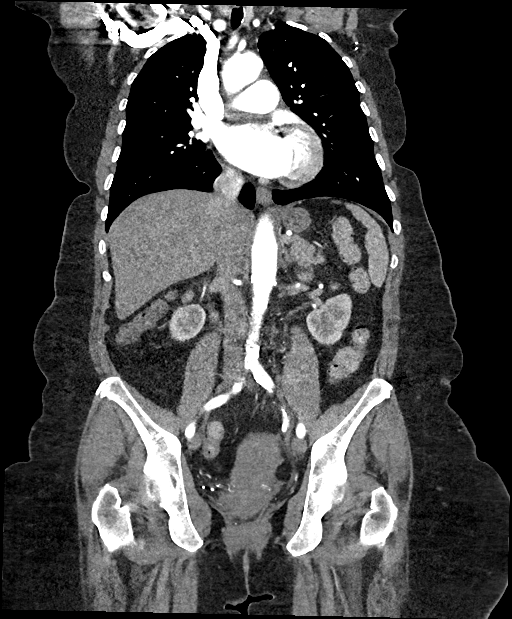
[im 116/155  soft-tissue]
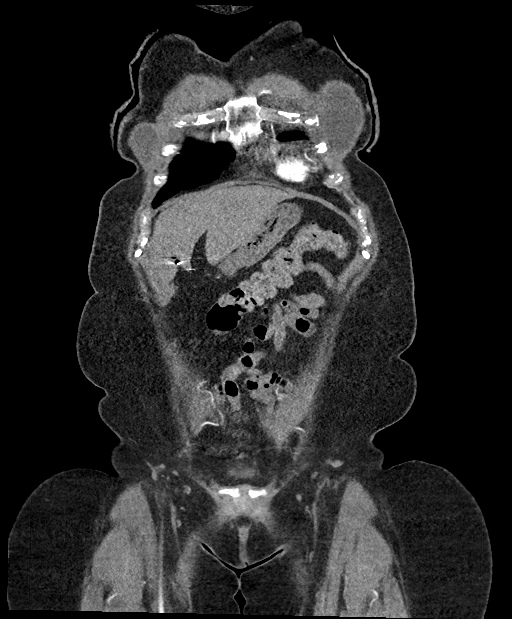

[14 of 46 positions shown; findings below may reference images not displayed]

Multidetector CT imaging through the chest, abdomen and pelvis was
performed using the standard protocol during bolus administration of
intravenous contrast. Multiplanar reconstructed images and MIPs were
obtained and reviewed to evaluate the vascular anatomy.

CONTRAST:  100mL OMNIPAQUE IOHEXOL 350 MG/ML SOLN
FINDINGS: CTA CHEST FINDINGS

Cardiovascular: The heart is normal in size. No pericardial
effusion. There is mild tortuosity, ectasia and calcification of the
thoracic aorta most notably the descending thoracic aorta. Mild
fusiform ectasia of the ascending aorta but no aneurysm or
dissection. The aortic branch vessels are patent. There are
scattered coronary artery calcifications. The pulmonary arteries
appear normal. No findings for pulmonary embolism.

Mediastinum/Nodes: No mediastinal or hilar mass or adenopathy.
Esophagus is grossly normal.

Lungs/Pleura: No acute pulmonary findings. No infiltrates, edema or
effusions. No worrisome pulmonary lesions. No pulmonary nodules.

Musculoskeletal: No significant bony findings.

Review of the MIP images confirms the above findings.

CTA ABDOMEN AND PELVIS FINDINGS

VASCULAR

Aorta: Moderate atherosclerotic calcifications involving the aorta
but no aneurysm or dissection.

Celiac: Normal

SMA: Normal

Renals: Minimal atherosclerotic calcifications at the renal artery
ostia bilaterally but no stenosis.

IMA: Patent

Inflow: Moderate atherosclerotic calcifications but no aneurysm,
dissection or significant stenosis.

Veins: Grossly normal.

Review of the MIP images confirms the above findings.

NON-VASCULAR

Hepatobiliary: Small scattered hepatic cysts are stable. No
worrisome hepatic lesions. Stable central intrahepatic biliary
dilatation and mild common bile duct dilatation likely related to
prior cholecystectomy. This is a stable finding since [VW]

Pancreas: No mass, inflammation or ductal dilatation.

Spleen: Normal size.  No focal lesions.

Adrenals/Urinary Tract: The adrenal glands and kidneys are
unremarkable. The bladder is unremarkable.

Stomach/Bowel: Stomach, duodenum, small bowel and colon are
unremarkable. No acute inflammatory changes, mass lesions
obstructive findings. The terminal ileum is normal. The appendix is
normal. There is a stable benign-appearing lipoma in the sigmoid
colon. The terminal ileum is normal. The appendix is normal.

Lymphatic: No mesenteric or retroperitoneal mass or adenopathy.

Reproductive: The uterus and ovaries are unremarkable.

Other: No pelvic mass or adenopathy. No free pelvic fluid
collections. No inguinal mass or adenopathy. No abdominal wall
hernia or subcutaneous lesions.

Musculoskeletal: No significant bony findings. Advanced degenerative
changes involving the spine. Lumbar fusion changes noted at L3-4 and
L4-5. Severe degenerative disc disease and facet disease at L5-S1.

Review of the MIP images confirms the above findings.
IMPRESSION: 1. No CT findings for aortic dissection or aneurysm. Moderate
atherosclerotic calcifications involving the thoracic and abdominal
aorta and branch vessels but no aneurysm or dissection.
2. No CT findings for pulmonary embolism.
3. No acute pulmonary findings.
4. No acute abdominal/pelvic findings, mass lesions or
lymphadenopathy.
5. Stable intra and extrahepatic biliary dilatation likely related
to prior cholecystectomy.

Aortic Atherosclerosis ([VW]-[VW]).

## 2021-08-03 MED ORDER — METHOCARBAMOL 500 MG PO TABS
500.0000 mg | ORAL_TABLET | Freq: Once | ORAL | Status: AC
  Administered 2021-08-03: 500 mg via ORAL
  Filled 2021-08-03: qty 1

## 2021-08-03 MED ORDER — FAMOTIDINE IN NACL 20-0.9 MG/50ML-% IV SOLN
20.0000 mg | Freq: Once | INTRAVENOUS | Status: AC
  Administered 2021-08-03: 20 mg via INTRAVENOUS
  Filled 2021-08-03: qty 50

## 2021-08-03 MED ORDER — IOHEXOL 350 MG/ML SOLN
100.0000 mL | Freq: Once | INTRAVENOUS | Status: AC | PRN
  Administered 2021-08-03: 100 mL via INTRAVENOUS

## 2021-08-03 MED ORDER — ALUM & MAG HYDROXIDE-SIMETH 200-200-20 MG/5ML PO SUSP
30.0000 mL | Freq: Once | ORAL | Status: AC
  Administered 2021-08-03: 30 mL via ORAL
  Filled 2021-08-03: qty 30

## 2021-08-03 MED ORDER — LIDOCAINE VISCOUS HCL 2 % MT SOLN
15.0000 mL | Freq: Once | OROMUCOSAL | Status: AC
  Administered 2021-08-03: 15 mL via ORAL
  Filled 2021-08-03: qty 15

## 2021-08-03 MED ORDER — FAMOTIDINE 20 MG PO TABS: 20.0000 mg | ORAL_TABLET | Freq: Two times a day (BID) | ORAL | 0 refills | Status: DC

## 2021-08-03 MED ORDER — LACTATED RINGERS IV BOLUS
1000.0000 mL | Freq: Once | INTRAVENOUS | Status: AC
  Administered 2021-08-03: 1000 mL via INTRAVENOUS

## 2021-08-03 NOTE — ED Provider Notes (Signed)
[place] EMERGENCY DEPT Provider Note   CSN: [id number] Arrival date & time: 08/03/21  1254     History Chief Complaint  Patient presents with   Abdominal Pain    Candice Maldonado is a 65 y.o. female.   Abdominal Pain Associated symptoms: nausea   Associated symptoms: no chest pain, no chills, no constipation, no cough, no dysuria, no fever, no hematuria, no shortness of breath, no sore throat and no vomiting   Patient presents for upper abdominal pain.  Onset of pain was late last night.  At the time, she was laying in bed.  Pain has persisted throughout today.  A radiates to the left side of her chest.  It does not radiate anywhere else.  Area of pain in her chest is worsened with movement.  She had similar pain 1 month ago.  At that time, she was seen at [facility], ED.  Work-up at that time included labs and CT scan of abdomen and pelvis.  Results were reassuring.  She was advised to follow-up with GI.  She has not been able to do so.  Currently, pain is 7/10 in severity.  Medical history is notable for remote breast cancer, DM, and HLD.  She has undergone cholecystectomy in the past.    Past Medical History:  Diagnosis Date   Anemia    had blood transfusion   Arthritis    Breast cancer ([place]) 2000   Colon polyps 2015   Depression    Diabetes ([place])    type 2  - diet controlled    Ductal carcinoma in situ of breast    Family history of breast cancer    Gallstones    GERD (gastroesophageal reflux disease)    HLD (hyperlipidemia)    Localized swelling of both lower legs    on HCTZ   Neuromuscular disorder ([place])    pinch nerve left side    Obesity    Personal history of chemotherapy    Personal history of malignant neoplasm of breast    Personal history of radiation therapy     Patient Active Problem List   Diagnosis Date Noted   Neoplasm of left breast, primary tumor staging category Tis: ductal carcinoma in situ (DCIS) 11/25/2020   Genetic  testing 10/14/2020   Family history of breast cancer    Personal history of malignant neoplasm of breast    Ductal carcinoma in situ of breast    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    Past Surgical History:  Procedure Laterality Date   BACK SURGERY  2011   BREAST LUMPECTOMY     CHOLECYSTECTOMY  1980   EVACUATION BREAST HEMATOMA Bilateral 11/26/2020   Procedure: EVACUATION POST-OP CHEST WALL HEMATOMA;  Surgeon: Donnie Mesa, MD;  Location: [facility];  Service: General;  Laterality: Bilateral;   LEFT MASTECTOMY AND RIGHT PROPHYLACTIC MASTECTOMY (Bilateral Breast)  11/25/2020   TOTAL MASTECTOMY Bilateral 11/25/2020   Procedure: LEFT MASTECTOMY AND RIGHT PROPHYLACTIC MASTECTOMY;  Surgeon: Donnie Mesa, MD;  Location: [facility];  Service: General;  Laterality: Bilateral;  LMA, PEC BLOCK; START TIME OF 7:30 AM FOR 120 MINUTES TSUEI IQ   TUBAL LIGATION  1990     OB History   No obstetric history on file.     Family History  Problem Relation Age of Onset   Breast cancer Mother        dx >33   Diabetes Mother    Diabetes Sister  Breast cancer Sister 71   Diabetes Brother    Breast cancer Maternal Grandmother        dx >60   Irritable bowel syndrome Daughter    Breast cancer Daughter 36   Stroke Father    Diabetes Father    Breast cancer Other        MGMs sister    Social History   Tobacco Use   Smoking status: Never   Smokeless tobacco: Current    Types: Chew  Vaping Use   Vaping Use: Never used  Substance Use Topics   Alcohol use: Not Currently    Comment: occasional wine   Drug use: Never    Home Medications Prior to Admission medications   Medication Sig Start Date End Date Taking? Authorizing Provider  famotidine (PEPCID) 20 MG tablet Take 1 tablet (20 mg total) by mouth 2 (two) times daily. 08/03/21  Yes Godfrey Pick, MD  ascorbic acid (VITAMIN C) 500 MG tablet Take 500 mg by mouth daily.    [provider]  aspirin EC 81 MG tablet  Take 81 mg by mouth daily. Swallow whole.    [provider]  atorvastatin (LIPITOR) 10 MG tablet Take 10 mg by mouth daily.    [provider]  baclofen (LIORESAL) 10 MG tablet Take 10 mg by mouth 2 (two) times daily.    [provider]  Black Cohosh 40 MG CAPS Take 40 mg by mouth daily.    [provider]  cholecalciferol (VITAMIN D3) 25 MCG (1000 UNIT) tablet Take 1,000 Units by mouth daily.    [provider]  ELDERBERRY PO Take 1 capsule by mouth daily.    [provider]  escitalopram (LEXAPRO) 10 MG tablet Take 10 mg by mouth daily. 05/13/21   [provider]  hydrochlorothiazide (HYDRODIURIL) 25 MG tablet Take 25 mg by mouth daily as needed (if is over 130).    [provider]  HYDROcodone-acetaminophen (NORCO) 10-325 MG tablet Take 1 tablet by mouth 3 (three) times daily as needed for severe pain. 04/06/21   [provider]  Multiple Vitamin (MULTIVITAMIN) tablet Take 1 tablet by mouth daily.    [provider]  naloxone [facility]) nasal spray 4 mg/0.1 mL Place 1 spray into the nose once as needed (overdose).    [provider]  omeprazole (PRILOSEC) 20 MG capsule Take 20 mg by mouth daily.    [provider]  oxyCODONE (OXY IR/ROXICODONE) 5 MG immediate release tablet Take 1-2 tablets (5-10 mg total) by mouth every 6 (six) hours as needed for moderate pain. Patient not taking: No sig reported 11/26/20   Donnie Mesa, MD  pregabalin (LYRICA) 200 MG capsule Take 200 mg by mouth 2 (two) times daily.    [provider]  pyridOXINE (VITAMIN B-6) 100 MG tablet Take 100 mg by mouth daily.    [provider]  tiZANidine (ZANAFLEX) 4 MG tablet Take 1 tablet (4 mg total) by mouth every 6 (six) hours as needed for muscle spasms. Patient not taking: No sig reported 09/28/20   Wieters, Hallie C, PA-C  vitamin B-12 (CYANOCOBALAMIN) 1000 MCG tablet Take 1,000 mcg by mouth daily.     [provider]    Allergies    Tape  Review of Systems   Review of Systems  Constitutional:  Negative for chills, diaphoresis and fever.  HENT:  Negative for congestion, ear pain and sore throat.   Eyes:  Negative for pain and visual  disturbance.  Respiratory:  Negative for cough, chest tightness and shortness of breath.   Cardiovascular:  Negative for chest pain and palpitations.  Gastrointestinal:  Positive for abdominal pain and nausea. Negative for blood in stool, constipation and vomiting.  Genitourinary:  Negative for dysuria, flank pain, hematuria and pelvic pain.  Musculoskeletal:  Negative for arthralgias, back pain, myalgias and neck pain.  Skin:  Negative for color change and rash.  Neurological:  Negative for dizziness, seizures, syncope, weakness, light-headedness, numbness and headaches.  Hematological:  Does not bruise/bleed easily.  All other systems reviewed and are negative.  Physical Exam Updated Vital Signs BP (!) 158/83   Pulse 66   Temp 98.5 F (36.9 C) (Oral)   Resp 18   SpO2 99%   Physical Exam Vitals and nursing note reviewed.  Constitutional:      General: She is not in acute distress.    Appearance: She is well-developed. She is not ill-appearing, toxic-appearing or diaphoretic.  HENT:     Head: Normocephalic and atraumatic.     Mouth/Throat:     Mouth: Mucous membranes are moist.     Pharynx: Oropharynx is clear.  Eyes:     Conjunctiva/sclera: Conjunctivae normal.  Cardiovascular:     Rate and Rhythm: Normal rate and regular rhythm.     Heart sounds: No murmur heard. Pulmonary:     Effort: Pulmonary effort is normal. No respiratory distress.     Breath sounds: Normal breath sounds. No wheezing or rales.  Chest:     Chest wall: Tenderness present.  Abdominal:     Palpations: Abdomen is soft.     Tenderness: There is abdominal tenderness in the epigastric area. There is no right CVA tenderness, left CVA tenderness, guarding or  rebound.  Musculoskeletal:     Cervical back: Neck supple.  Skin:    General: Skin is warm and dry.     Coloration: Skin is not jaundiced or pale.  Neurological:     General: No focal deficit present.     Mental Status: She is alert and oriented to person, place, and time.     Cranial Nerves: No cranial nerve deficit.     Motor: No weakness.  Psychiatric:        Mood and Affect: Mood normal.        Behavior: Behavior normal.    ED Results / Procedures / Treatments   Labs (all labs ordered are listed, but only abnormal results are displayed) Labs Reviewed  CBC - Abnormal; Notable for the following components:      Result Value   Hemoglobin 11.5 (*)    MCH 25.2 (*)    RDW 15.9 (*)    All other components within normal limits  URINALYSIS, ROUTINE W REFLEX MICROSCOPIC - Abnormal; Notable for the following components:   Hgb urine dipstick TRACE (*)    All other components within normal limits  LIPASE, BLOOD  COMPREHENSIVE METABOLIC PANEL  TROPONIN I (HIGH SENSITIVITY)    EKG EKG Interpretation  Date/Time:  Wednesday August 03 2021 13:51:16 EDT Ventricular Rate:  80 PR Interval:  166 QRS Duration: 86 QT Interval:  396 QTC Calculation: 456 R Axis:   -19 Text Interpretation: Normal sinus rhythm Septal infarct , age undetermined Abnormal ECG Confirmed by Godfrey Pick [id number]) on 08/03/2021 5:02:42 PM  Radiology CT Angio Chest/Abd/Pel for Dissection W and/or Wo Contrast  Result Date: 08/03/2021 CLINICAL DATA:  Chest and abdominal pain. EXAM: CT ANGIOGRAPHY CHEST, ABDOMEN AND PELVIS TECHNIQUE:  Non-contrast CT of the chest was initially obtained. Multidetector CT imaging through the chest, abdomen and pelvis was performed using the standard protocol during bolus administration of intravenous contrast. Multiplanar reconstructed images and MIPs were obtained and reviewed to evaluate the vascular anatomy. CONTRAST:  138mL OMNIPAQUE IOHEXOL 350 MG/ML SOLN COMPARISON:  CT abdomen/pelvis  06/30/2021 FINDINGS: CTA CHEST FINDINGS Cardiovascular: The heart is normal in size. No pericardial effusion. There is mild tortuosity, ectasia and calcification of the thoracic aorta most notably the descending thoracic aorta. Mild fusiform ectasia of the ascending aorta but no aneurysm or dissection. The aortic branch vessels are patent. There are scattered coronary artery calcifications. The pulmonary arteries appear normal. No findings for pulmonary embolism. Mediastinum/Nodes: No mediastinal or hilar mass or adenopathy. Esophagus is grossly normal. Lungs/Pleura: No acute pulmonary findings. No infiltrates, edema or effusions. No worrisome pulmonary lesions. No pulmonary nodules. Musculoskeletal: No significant bony findings. Review of the MIP images confirms the above findings. CTA ABDOMEN AND PELVIS FINDINGS VASCULAR Aorta: Moderate atherosclerotic calcifications involving the aorta but no aneurysm or dissection. Celiac: Normal SMA: Normal Renals: Minimal atherosclerotic calcifications at the renal artery ostia bilaterally but no stenosis. IMA: Patent Inflow: Moderate atherosclerotic calcifications but no aneurysm, dissection or significant stenosis. Veins: Grossly normal. Review of the MIP images confirms the above findings. NON-VASCULAR Hepatobiliary: Small scattered hepatic cysts are stable. No worrisome hepatic lesions. Stable central intrahepatic biliary dilatation and mild common bile duct dilatation likely related to prior cholecystectomy. This is a stable finding since 2021 Pancreas: No mass, inflammation or ductal dilatation. Spleen: Normal size.  No focal lesions. Adrenals/Urinary Tract: The adrenal glands and kidneys are unremarkable. The bladder is unremarkable. Stomach/Bowel: Stomach, duodenum, small bowel and colon are unremarkable. No acute inflammatory changes, mass lesions obstructive findings. The terminal ileum is normal. The appendix is normal. There is a stable benign-appearing lipoma in  the sigmoid colon. The terminal ileum is normal. The appendix is normal. Lymphatic: No mesenteric or retroperitoneal mass or adenopathy. Reproductive: The uterus and ovaries are unremarkable. Other: No pelvic mass or adenopathy. No free pelvic fluid collections. No inguinal mass or adenopathy. No abdominal wall hernia or subcutaneous lesions. Musculoskeletal: No significant bony findings. Advanced degenerative changes involving the spine. Lumbar fusion changes noted at L3-4 and L4-5. Severe degenerative disc disease and facet disease at L5-S1. Review of the MIP images confirms the above findings. IMPRESSION: 1. No CT findings for aortic dissection or aneurysm. Moderate atherosclerotic calcifications involving the thoracic and abdominal aorta and branch vessels but no aneurysm or dissection. 2. No CT findings for pulmonary embolism. 3. No acute pulmonary findings. 4. No acute abdominal/pelvic findings, mass lesions or lymphadenopathy. 5. Stable intra and extrahepatic biliary dilatation likely related to prior cholecystectomy. Aortic Atherosclerosis (ICD10-I70.0). Electronically Signed   By: Marijo Sanes M.D.   On: 08/03/2021 19:09    Procedures Procedures   Medications Ordered in ED Medications  alum & mag hydroxide-simeth (MAALOX/MYLANTA) 200-200-20 MG/5ML suspension 30 mL (30 mLs Oral Given 08/03/21 1751)    And  lidocaine (XYLOCAINE) 2 % viscous mouth solution 15 mL (15 mLs Oral Given 08/03/21 1751)  famotidine (PEPCID) IVPB 20 mg premix (0 mg Intravenous Stopped 08/03/21 1904)  methocarbamol (ROBAXIN) tablet 500 mg (500 mg Oral Given 08/03/21 1751)  iohexol (OMNIPAQUE) 350 MG/ML injection 100 mL (100 mLs Intravenous Contrast Given 08/03/21 1838)  lactated ringers bolus 1,000 mL (1,000 mLs Intravenous New Bag/Given 08/03/21 1923)    ED Course  I have reviewed the triage vital signs  and the nursing notes.  Pertinent labs & imaging results that were available during my care of the patient were  reviewed by me and considered in my medical decision making (see chart for details).    MDM Rules/Calculators/A&P                          Patient is a 65 year old female with history of breast cancer, DM, HLD, HTN, presenting for epigastric pain since last night.  She describes her symptoms as similar to symptoms from 1 month ago, during which time she was seen at [facility], ED.  Vital signs on arrival notable for hypertension.  On exam, she does have tenderness in the areas of her pain: Epigastrium and left lower anterior chest.  Prior to being bedded in the ED, lab work was obtained.  Results no baseline anemia, no leukocytosis, normal electrolytes, normal lipase, and normal hepatobiliary enzymes.  Troponin was added on.  CTA was ordered.  Patient was given a GI cocktail for empiric treatment of gastritis.  Troponin was normal.  CTA showed no acute findings.  Patient does have moderate atherosclerotic calcifications.  There was no evidence of PE, mass lesions, or acute vessel injuries.  On reassessment, patient reported improved pain.  She was prescribed Pepcid for ongoing treatment of gastritis.  She was also advised to avoid NSAID use and to follow-up with primary care doctor.  She was discharged in stable condition.  Final Clinical Impression(s) / ED Diagnoses Final diagnoses:  Epigastric pain    Rx / DC Orders ED Discharge Orders          Ordered    famotidine (PEPCID) 20 MG tablet  2 times daily        08/03/21 2018             Godfrey Pick, MD 08/04/21 [phone]

## 2021-08-03 NOTE — ED Triage Notes (Signed)
Patient reports to the ER for abdominal pain. Patient reports the pain is upper abdominal and feels like it is "ripping"

## 2021-08-03 NOTE — ED Notes (Signed)
Lab contacted about add-on troponin.

## 2021-08-23 ENCOUNTER — Other Ambulatory Visit: Payer: Self-pay | Admitting: Student

## 2021-08-23 DIAGNOSIS — M5430 Sciatica, unspecified side: Secondary | ICD-10-CM

## 2021-08-23 DIAGNOSIS — R109 Unspecified abdominal pain: Secondary | ICD-10-CM

## 2021-08-23 DIAGNOSIS — M5442 Lumbago with sciatica, left side: Secondary | ICD-10-CM

## 2021-09-25 ENCOUNTER — Other Ambulatory Visit: Payer: Self-pay

## 2021-09-25 ENCOUNTER — Ambulatory Visit
Admission: RE | Admit: 2021-09-25 | Discharge: 2021-09-25 | Disposition: A | Discharge status: Home / Self Care | Payer: Medicare Other | Source: Ambulatory Visit | Attending: Student | Admitting: Student

## 2021-09-25 DIAGNOSIS — M5442 Lumbago with sciatica, left side: Secondary | ICD-10-CM

## 2021-09-25 DIAGNOSIS — R109 Unspecified abdominal pain: Secondary | ICD-10-CM

## 2021-09-25 DIAGNOSIS — M5430 Sciatica, unspecified side: Secondary | ICD-10-CM

## 2021-09-25 IMAGING — MR MR LUMBAR SPINE W/O CM
4 of 5 series · 24 of 48 positions shown · non-contrast
Comparison: Lumbar MRI [DATE].  Abdominopelvic CT [DATE].

CLINICAL DATA: Low back pain radiating into the left leg for 3
months. No known injury. History of breast cancer and lumbar fusion
in [LP].

EXAM:
MRI LUMBAR SPINE WITHOUT CONTRAST
TECHNIQUE: Multiplanar, multisequence MR imaging of the lumbar spine was
performed. No intravenous contrast was administered.

[Series 3: T2 · sagittal · 4.0mm · 0.55mm/px · 7 of 16 slices shown (1 of 2)]
[im 1/16]
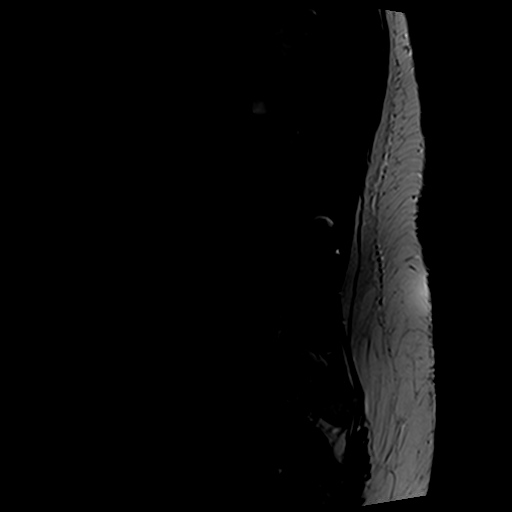
[im 3/16]
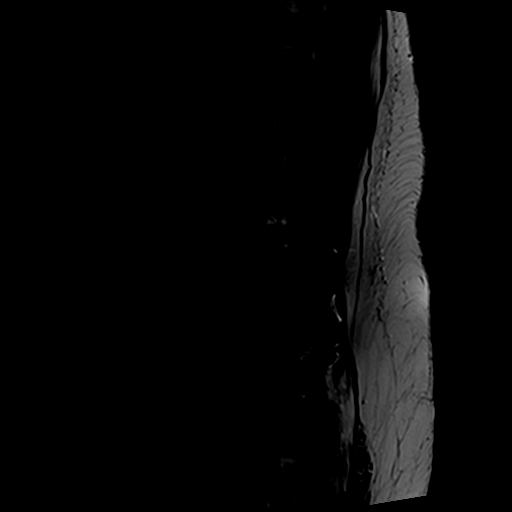
[im 6/16]
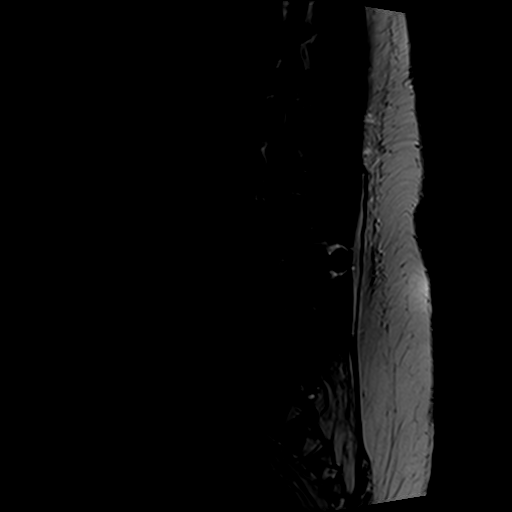
[im 8/16]
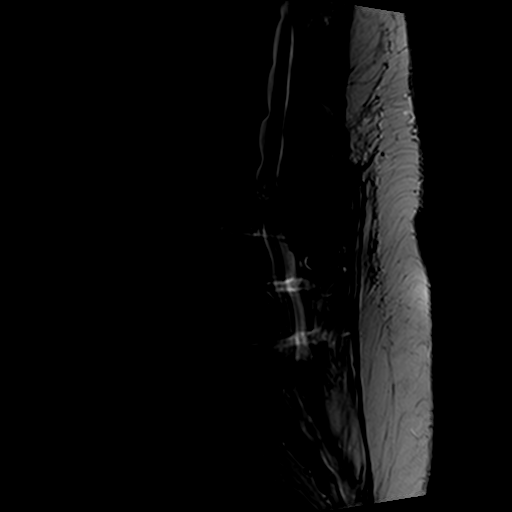
[im 11/16]
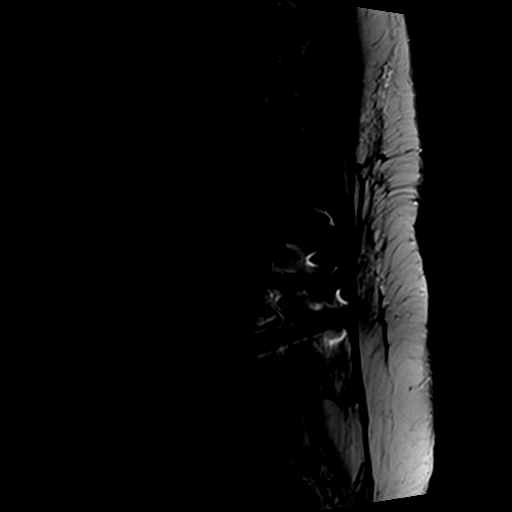
[im 13/16]
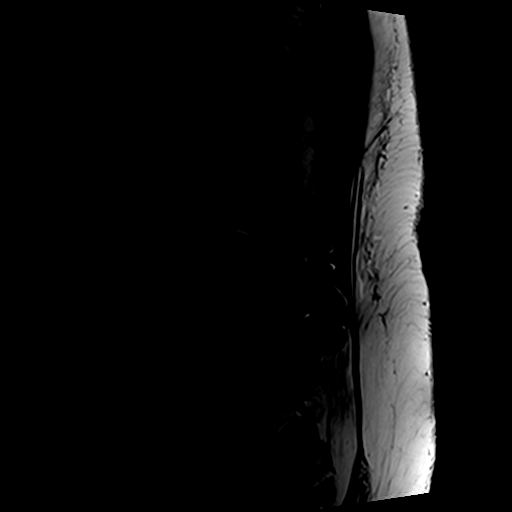
[im 16/16]
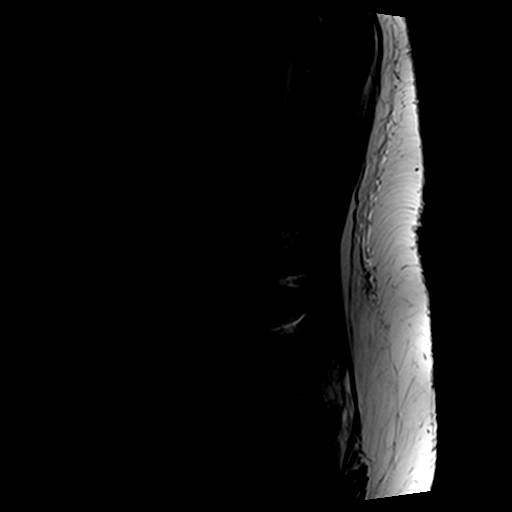

[Series 4: T1 · sagittal · 4.0mm · 0.55mm/px · 6 of 16 slices shown (1 of 2)]
[im 1/16]
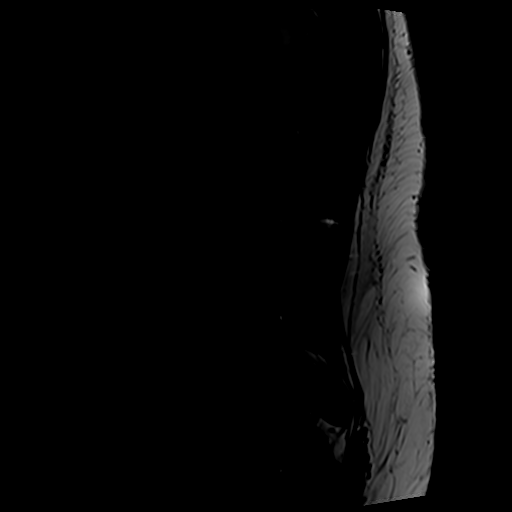
[im 3/16]
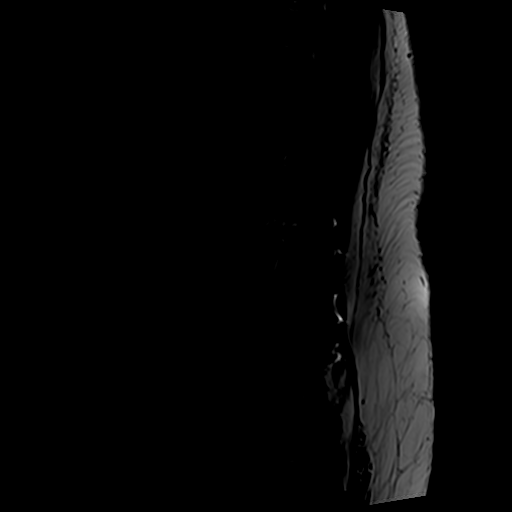
[im 6/16]
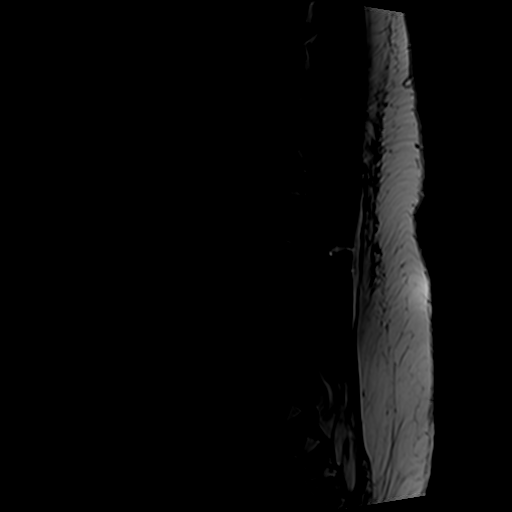
[im 8/16]
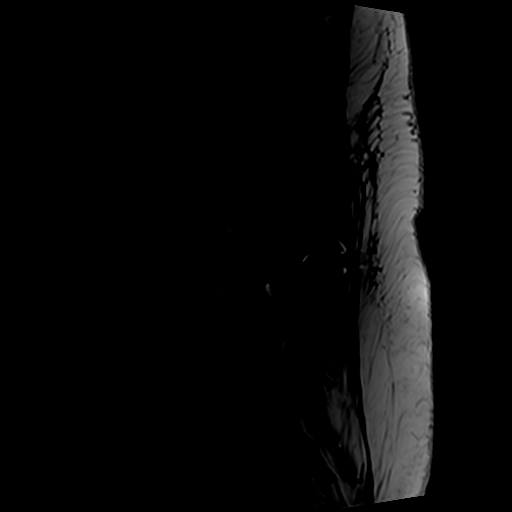
[im 11/16]
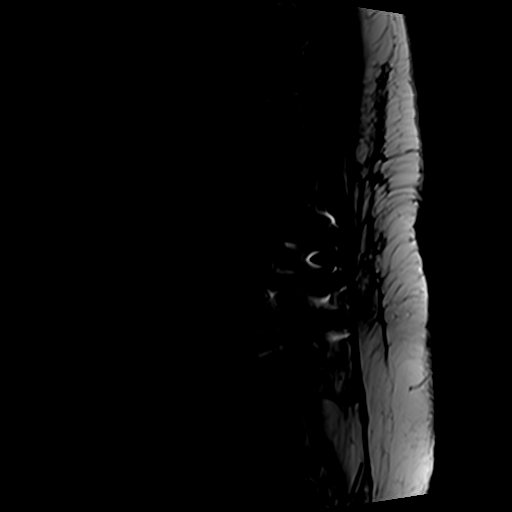
[im 13/16]
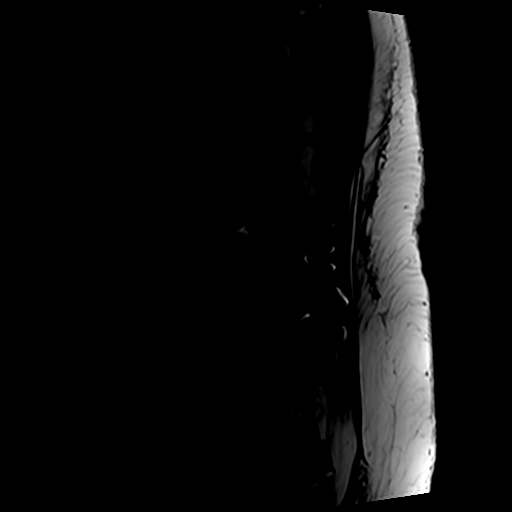

[Series 6: T2 · axial · 4.0mm · 0.70mm/px · z∈[-159,+51]mm · 8 of 36 slices shown (2 of 2)]
[im 1/36]
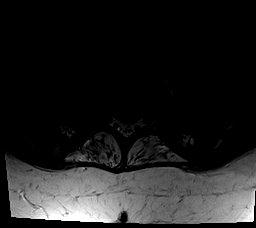
[im 6/36]
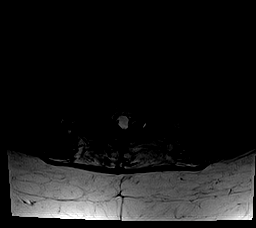
[im 11/36]
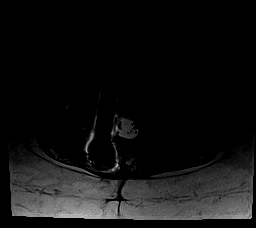
[im 17/36]
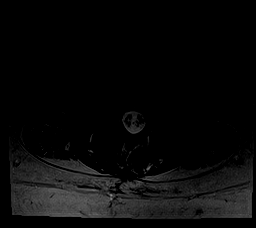
[im 19/36]
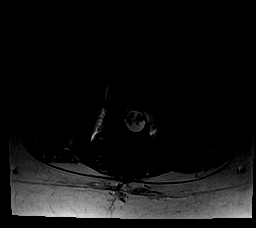
[im 25/36]
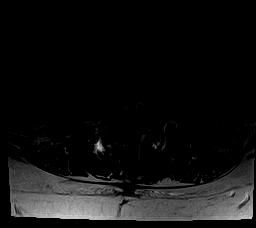
[im 30/36]
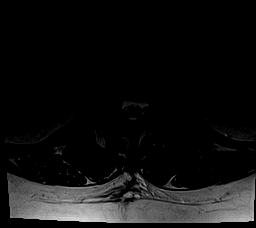
[im 36/36]
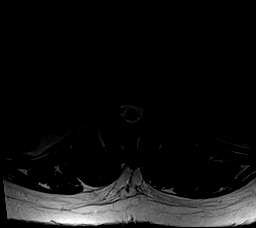

[Series 7: T1 · axial · 4.0mm · 0.35mm/px · z∈[-134,+20]mm · 3 of 36 slices shown (2 of 2)]
[im 6/36]
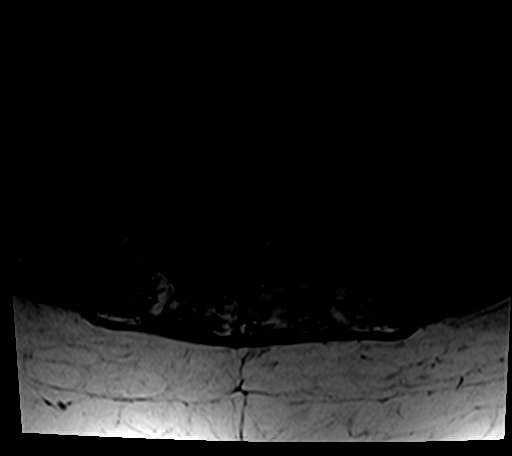
[im 19/36]
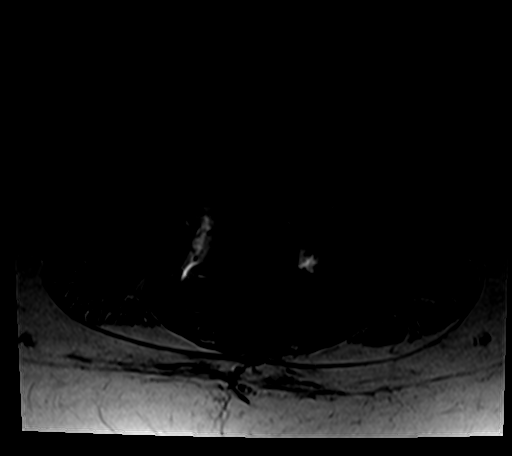
[im 30/36]
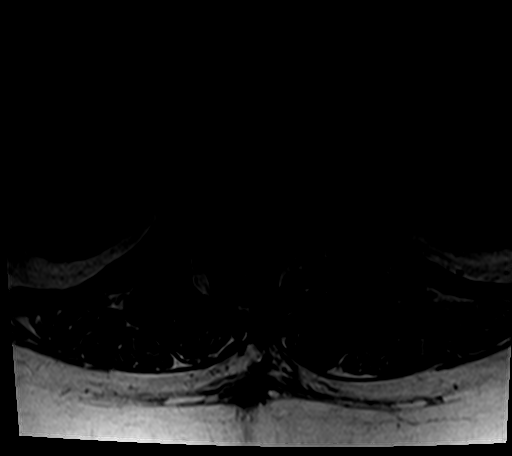

[24 of 48 positions shown; findings below may reference images not displayed]

FINDINGS: Segmentation: Conventional anatomy assumed, with the last open disc
space designated L5-S1.Concordant with previous imaging.

Alignment: Stable grade 1 anterolisthesis at L2-3 and grade 1
retrolisthesis at L5-S1.

Vertebrae: No worrisome osseous lesion, acute fracture or pars
defect. There is a stable hemangioma within the L2 vertebral body.
Multilevel postsurgical changes are stable status post L3-5
posterior decompression and interbody fusion. The visualized
sacroiliac joints appear unremarkable.

Conus medullaris: Extends to the L2 level and appears normal.

Paraspinal and other soft tissues: No significant paraspinal
findings.

Disc levels:

T12-L1: Stable mild disc bulging. There is a small extruded disc
fragment extending superiorly in the left T12 lateral recess which
could contribute to left T12 nerve root encroachment. Mild bilateral
facet hypertrophy.

L1-2: Loss of disc height with annular disc bulging and endplate
osteophytes asymmetric to the right. Mild facet and ligamentous
hypertrophy. Stable asymmetric right foraminal narrowing.

L2-3: Adjacent segment disease with loss of disc height, annular
disc bulging and endplate osteophytes asymmetric to the right.
Moderate facet and ligamentous hypertrophy. Resulting severe
multifactorial spinal stenosis and chronic effacement of the lateral
recesses appears unchanged. There is moderate right and mild left
foraminal narrowing.

L3-4: Status post fusion and posterior decompression. The spinal
canal and neural foramina are patent.

L4-5: Status post fusion and posterior decompression. The spinal
canal and neural foramina are patent.

L5-S1: Remote posterior decompression. Chronic degenerative disc
disease with loss of disc height, annular disc bulging and endplate
osteophytes. Chronic narrowing of the lateral recesses and foramina
appears unchanged.
IMPRESSION: 1. New small left-sided disc extrusion at T12-L1 with cephalad
extension and possible left T12 nerve root encroachment.
2. Stable asymmetric right foraminal narrowing at L1-2.
3. Adjacent segment disease at L2-3 with resulting severe
multifactorial spinal stenosis, moderate right and mild left
foraminal narrowing, similar to previous study.
4. Stable postsurgical changes at the lower 3 levels with chronic
lateral recess and foraminal narrowing bilaterally at L5-S1.

## 2022-01-24 ENCOUNTER — Other Ambulatory Visit: Payer: Self-pay | Admitting: Specialist

## 2022-01-24 DIAGNOSIS — M5451 Vertebrogenic low back pain: Secondary | ICD-10-CM

## 2022-02-02 ENCOUNTER — Other Ambulatory Visit: Payer: Self-pay | Admitting: Student

## 2022-02-02 DIAGNOSIS — N644 Mastodynia: Secondary | ICD-10-CM

## 2022-02-06 ENCOUNTER — Inpatient Hospital Stay: Admission: RE | Admit: 2022-02-06 | Payer: Medicare Other | Source: Ambulatory Visit

## 2022-02-14 ENCOUNTER — Ambulatory Visit
Admission: RE | Admit: 2022-02-14 | Discharge: 2022-02-14 | Disposition: A | Discharge status: Home / Self Care | Payer: Medicare Other | Source: Ambulatory Visit | Attending: Specialist | Admitting: Specialist

## 2022-02-14 DIAGNOSIS — M5451 Vertebrogenic low back pain: Secondary | ICD-10-CM

## 2022-02-14 IMAGING — CT CT L SPINE W/ CM
1 of 6 series · 7 of 14 positions shown, 9 images · non-contrast
Comparison: MRI lumbar spine dated [DATE].

CLINICAL DATA: Chronic, progressively worsening low back pain
radiating down the back of both legs to the calf. History of prior
fusion.
TECHNIQUE: Contiguous axial images were obtained through the lumbar spine after
the intrathecal infusion of contrast. Coronal and sagittal
reconstructions were obtained of the axial image sets.

[Series 3: l spine soft · axial · 0.35mm/px · z∈[-480,-308]mm · 7 of 116 slices shown, 9 images]
[im 15/116  soft-tissue]
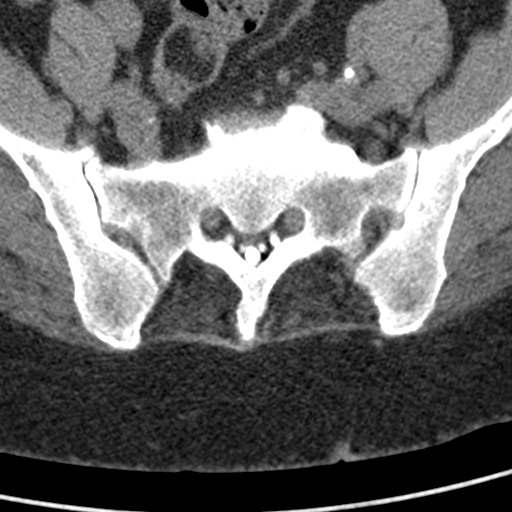
[im 15/116  bone]
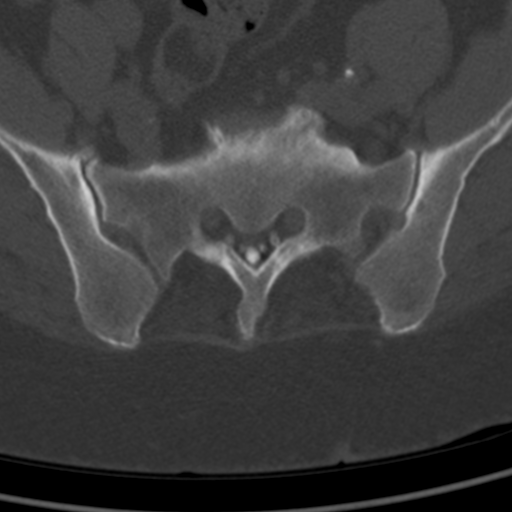
[im 29/116  bone]
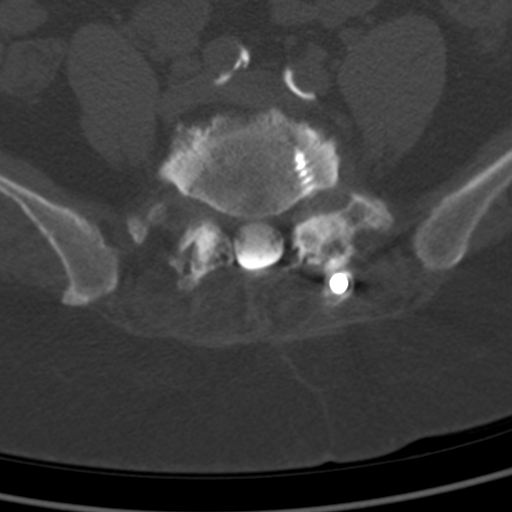
[im 44/116  bone]
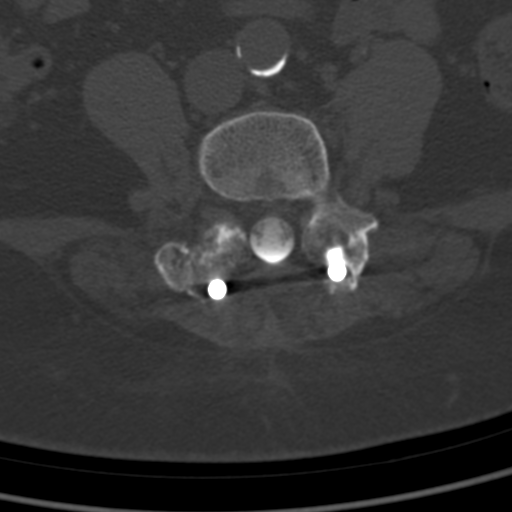
[im 58/116  bone]
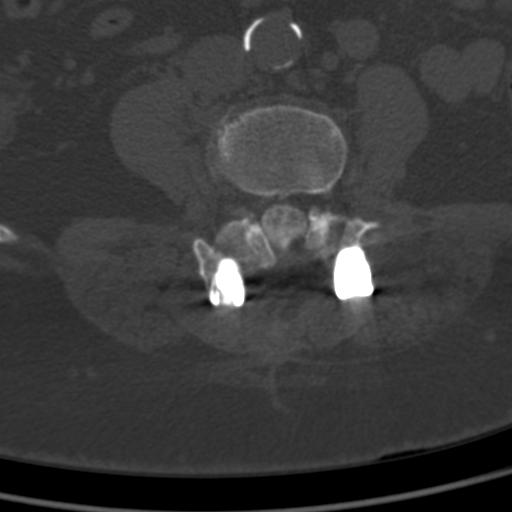
[im 72/116  soft-tissue]
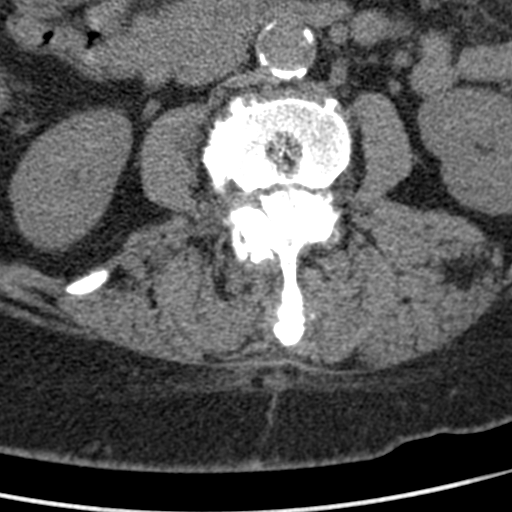
[im 72/116  bone]
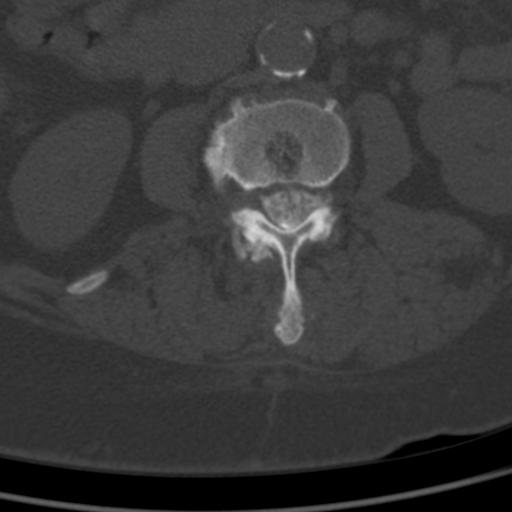
[im 87/116  bone]
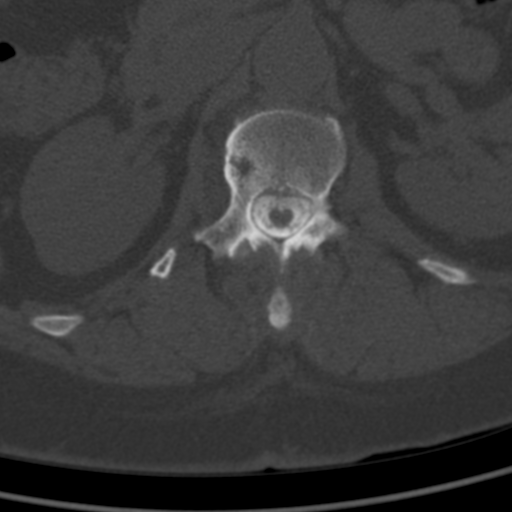
[im 101/116  bone]
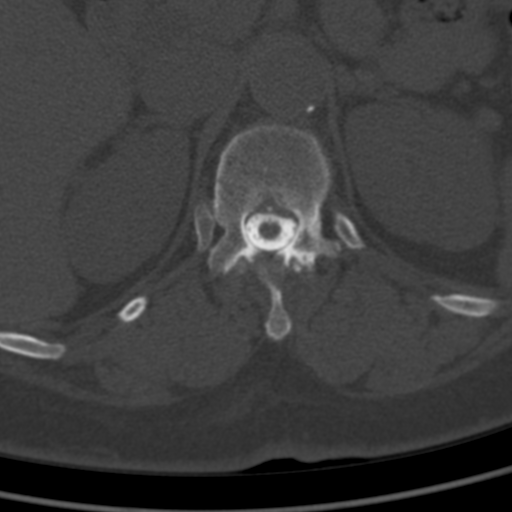

[7 of 14 positions shown; findings below may reference images not displayed]

EXAM:
LUMBAR MYELOGRAM

CT LUMBAR MYELOGRAM

FLUOROSCOPY TIME:  Radiation Exposure Index (as provided by the
fluoroscopic device): 13.5 mGy Kerma

PROCEDURE:
After thorough discussion of risks and benefits of the procedure
including bleeding, infection, injury to nerves, blood vessels,
adjacent structures as well as headache and CSF leak, written and
oral informed consent was obtained. Consent was obtained by Dr.
STYLES. Time out form was completed.

Patient was positioned prone on the fluoroscopy table. Local
anesthesia was provided with 1% lidocaine without epinephrine after
prepped and draped in the usual sterile fashion. Puncture was
performed at L4 using a 5 inch 22-gauge spinal needle via midline
approach. Using a single pass through the dura, the needle was
placed within the thecal sac, with return of clear CSF. 15 mL of
Isovue [J4] was injected into the thecal sac, with normal
opacification of the nerve roots and cauda equina consistent with
free flow within the subarachnoid space.

I personally performed the lumbar puncture and administered the
intrathecal contrast. I also personally supervised acquisition of
the myelogram images.
FINDINGS: LUMBAR MYELOGRAM FINDINGS:

Prior L3-L5 PLIF. Mild levoscoliosis. No significant lateral
translation. 6 mm anterolisthesis at L2-L3 when standing is
unchanged in flexion and improves with extension. 4 mm
retrolisthesis at L5-S1 improves with flexion and is unchanged in
extension.

Small ventral extradural defects from T12-L1 through L2-L3 and at
L5-S1. Severe spinal canal stenosis at L2-L3 with clumping of the
proximal cauda equina nerve roots. Underfilling of the bilateral L5
and S1 nerve roots.

CT LUMBAR MYELOGRAM FINDINGS:

Segmentation: Standard.

Alignment: Mild levoscoliosis. Trace anterolisthesis at L1-L2. 6 mm
anterolisthesis at L2-L3. 4 mm retrolisthesis at L5-S1. Findings are
unchanged from prior.

Vertebrae: Prior L3-L5 PLIF. Solid interbody and posterior fusion at
both levels. The right L3 pedicle screw approaches and slightly
violates the superior endplate (series 7, image 29). No evidence of
hardware loosening. No acute fracture or other focal pathologic
process. Unchanged L1 and L2 vertebral body hemangiomas.

Conus medullaris and cauda equina: Conus extends to the L2 level.
Clumping of the proximal cauda equina nerve roots.

Paraspinal and other soft tissues: Aortoiliac atherosclerotic
vascular disease. Bilateral sacroiliac joint osteoarthritis.

Disc levels:

T11-T12: Unchanged mild disc bulging with moderate left and mild
right facet arthropathy. No stenosis.

T12-L1: Mild disc bulging with interval increase in size of
superimposed left paracentral disc extrusion migrating superiorly.
Unchanged mild-to-moderate bilateral facet arthropathy. Unchanged
mild left lateral recess stenosis. No spinal canal or neuroforaminal
stenosis.

L1-L2: Unchanged mild-to-moderate disc bulging and endplate spurring
asymmetric to the right. Unchanged moderate right mild-to-moderate
left facet arthropathy. Unchanged mild spinal canal and right
greater than left lateral recess stenosis. Unchanged moderate right
and mild left neuroforaminal stenosis.

L2-L3: Unchanged moderate disc bulging and endplate spurring
asymmetric to the right with severe bilateral facet arthropathy.
Unchanged severe spinal canal stenosis with moderate right and mild
left neuroforaminal stenosis.

L3-L4: Prior posterior decompression and PLIF. No residual stenosis.

L4-L5: Prior posterior decompression and PLIF. No residual stenosis.

L5-S1: Prior posterior decompression. Unchanged bulky
circumferential disc osteophyte complex with severe left and
moderate right facet arthropathy. Unchanged moderate bilateral
lateral recess stenosis and severe bilateral neuroforaminal
stenosis. No spinal canal stenosis.
IMPRESSION: 1. Prior L3-L5 PLIF with solid fusion at both levels. The right L3
pedicle screw approaches and slightly violates the superior
endplate. Otherwise, no evidence of hardware complication.
2. Unchanged adjacent segment severe spinal canal stenosis at L2-L3.
3. Unchanged adjacent segment moderate bilateral lateral recess and
severe bilateral neuroforaminal stenosis at L5-S1.
4. Interval increase in size of superimposed left paracentral disc
extrusion at T12-L1 without high-grade stenosis or impingement.
5. Mild dynamic instability at L2-L3 and L5-S1.
6. Aortic Atherosclerosis ([J4]-[J4]).

## 2022-02-14 IMAGING — XA DG MYELOGRAPHY LUMBAR INJ LUMBOSACRAL
12 of 20 series · 12 of 20 positions shown · non-contrast
Comparison: MRI lumbar spine dated [DATE].

CLINICAL DATA: Chronic, progressively worsening low back pain
radiating down the back of both legs to the calf. History of prior
fusion.
TECHNIQUE: Contiguous axial images were obtained through the lumbar spine after
the intrathecal infusion of contrast. Coronal and sagittal
reconstructions were obtained of the axial image sets.

[Series 1: vasc standard · 1 of 1 slices shown (1 of 8)]
[im 1/1]
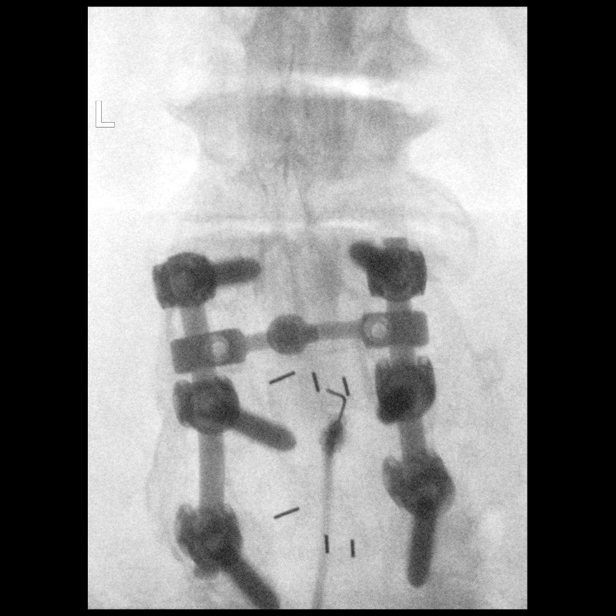

[Series 2: vasc standard · 1 of 1 slices shown (2 of 8)]
[im 1/1]
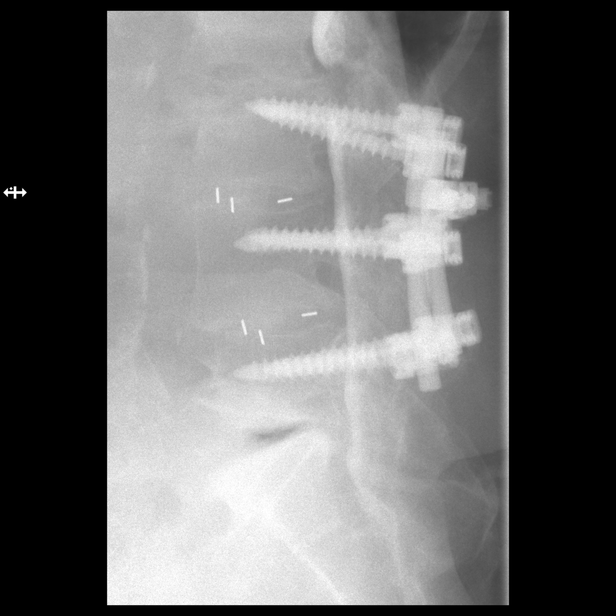

[Series 3: w lumbar spine ap · 0.15mm/px · 1 of 1 slices shown (1 of 2)]
[im 1/1]
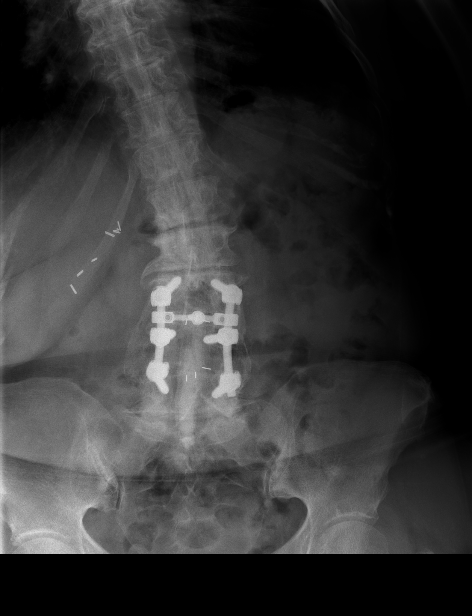

[Series 4: w lumbar spine ap · 0.15mm/px · 1 of 1 slices shown (2 of 2)]
[im 1/1]
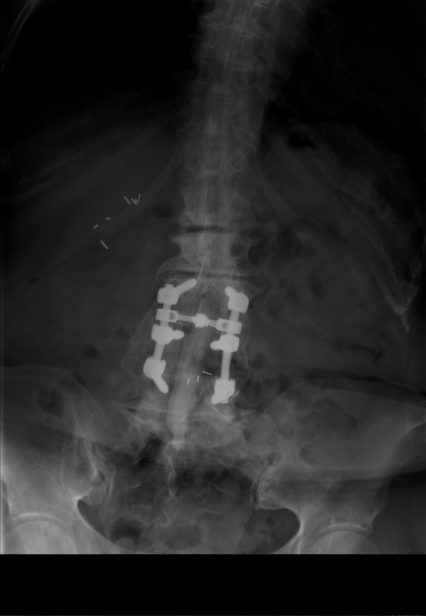

[Series 5: w lumbar spine lat · 0.15mm/px · 1 of 1 slices shown]
[im 1/1]
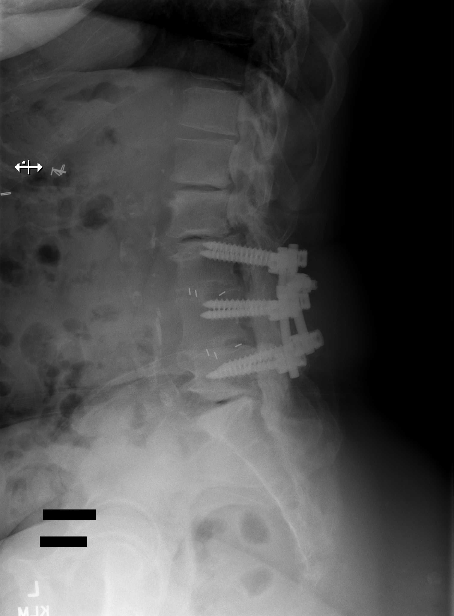

[Series 6: vasc standard · 1 of 1 slices shown (3 of 8)]
[im 1/1]
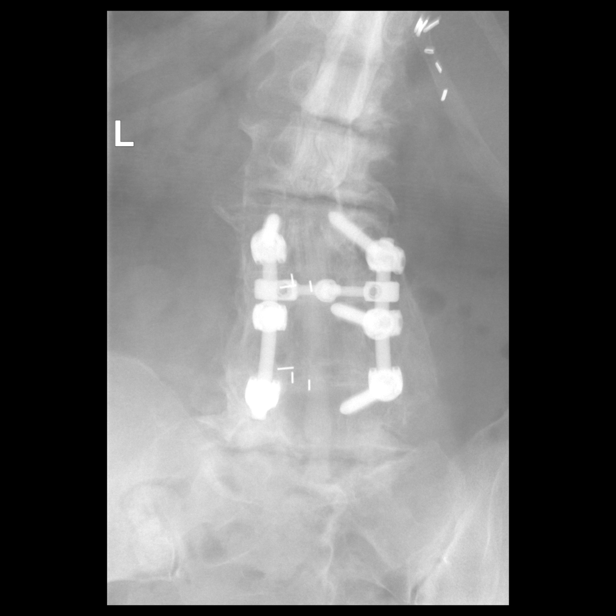

[Series 6: w lumbar spine flexion · 0.15mm/px · 1 of 1 slices shown]
[im 1/1]
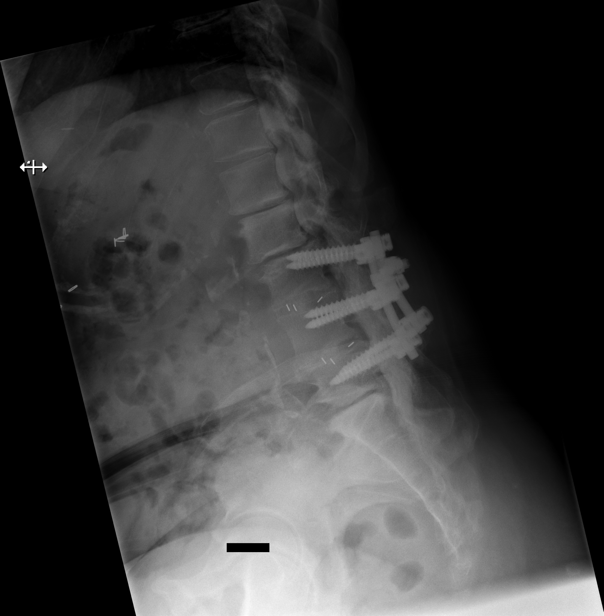

[Series 7: vasc standard · 1 of 1 slices shown (4 of 8)]
[im 1/1]
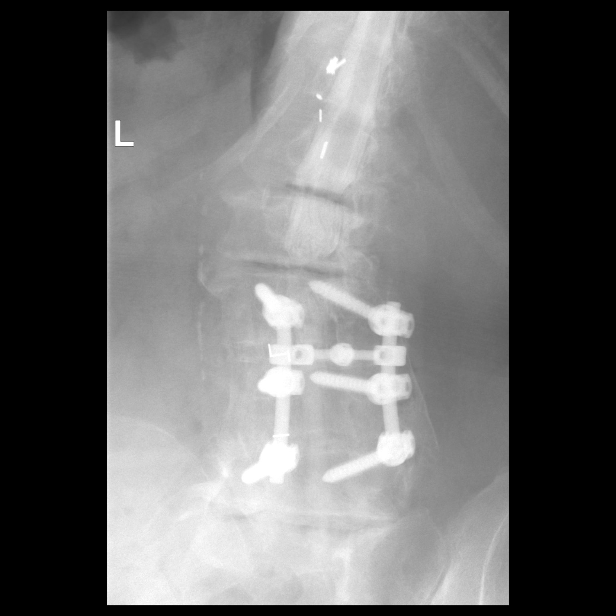

[Series 9: vasc standard · 1 of 1 slices shown (5 of 8)]
[im 1/1]
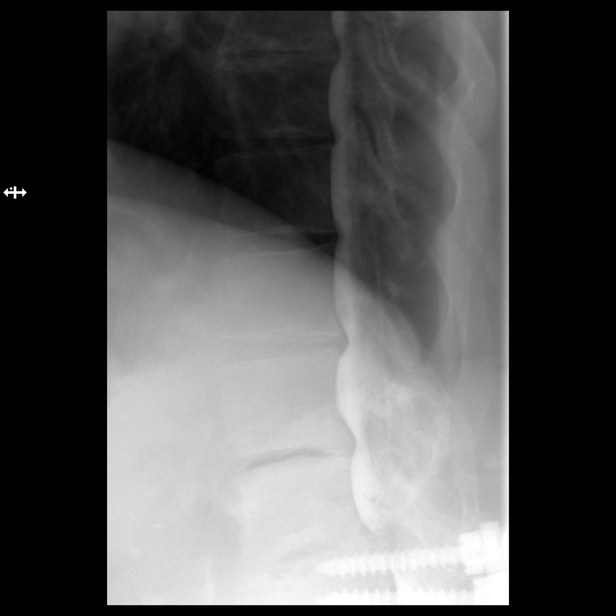

[Series 10: vasc standard · 1 of 1 slices shown (6 of 8)]
[im 1/1]
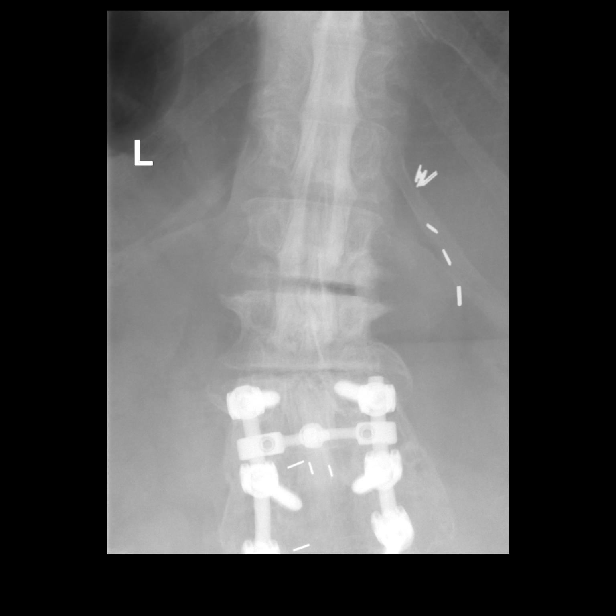

[Series 12: vasc standard · 1 of 1 slices shown (7 of 8)]
[im 1/1]
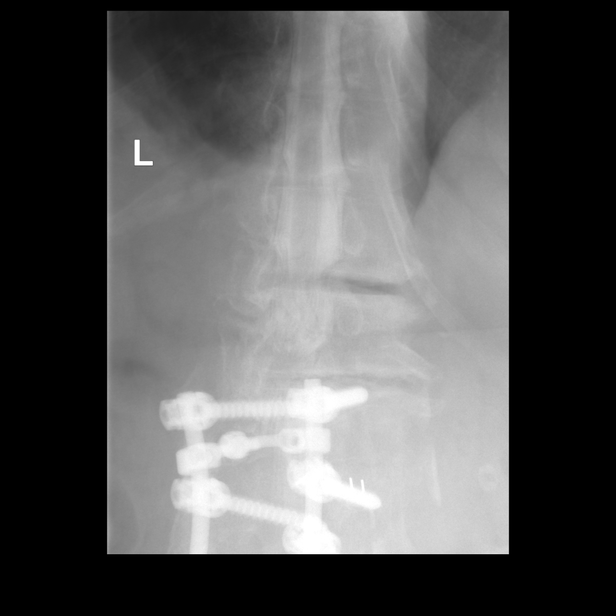

[Series 14: vasc standard · 1 of 1 slices shown (8 of 8)]
[im 1/1]
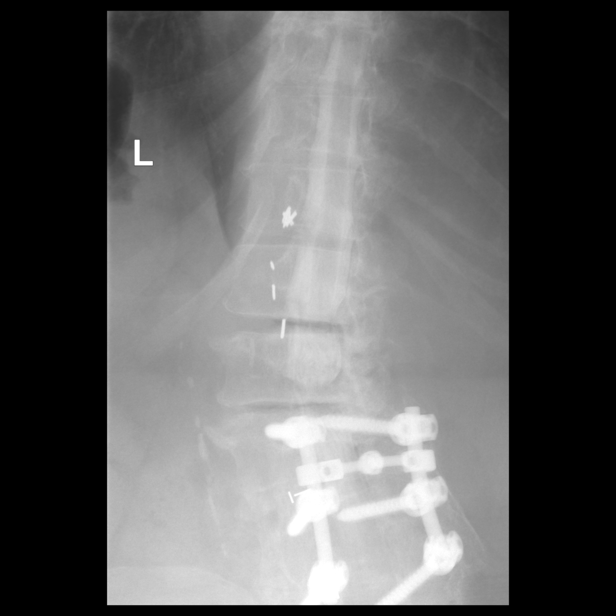

[12 of 20 positions shown; findings below may reference images not displayed]

EXAM:
LUMBAR MYELOGRAM

CT LUMBAR MYELOGRAM

FLUOROSCOPY TIME:  Radiation Exposure Index (as provided by the
fluoroscopic device): 13.5 mGy Kerma

PROCEDURE:
After thorough discussion of risks and benefits of the procedure
including bleeding, infection, injury to nerves, blood vessels,
adjacent structures as well as headache and CSF leak, written and
oral informed consent was obtained. Consent was obtained by Dr.
STYLES. Time out form was completed.

Patient was positioned prone on the fluoroscopy table. Local
anesthesia was provided with 1% lidocaine without epinephrine after
prepped and draped in the usual sterile fashion. Puncture was
performed at L4 using a 5 inch 22-gauge spinal needle via midline
approach. Using a single pass through the dura, the needle was
placed within the thecal sac, with return of clear CSF. 15 mL of
Isovue [J4] was injected into the thecal sac, with normal
opacification of the nerve roots and cauda equina consistent with
free flow within the subarachnoid space.

I personally performed the lumbar puncture and administered the
intrathecal contrast. I also personally supervised acquisition of
the myelogram images.
FINDINGS: LUMBAR MYELOGRAM FINDINGS:

Prior L3-L5 PLIF. Mild levoscoliosis. No significant lateral
translation. 6 mm anterolisthesis at L2-L3 when standing is
unchanged in flexion and improves with extension. 4 mm
retrolisthesis at L5-S1 improves with flexion and is unchanged in
extension.

Small ventral extradural defects from T12-L1 through L2-L3 and at
L5-S1. Severe spinal canal stenosis at L2-L3 with clumping of the
proximal cauda equina nerve roots. Underfilling of the bilateral L5
and S1 nerve roots.

CT LUMBAR MYELOGRAM FINDINGS:

Segmentation: Standard.

Alignment: Mild levoscoliosis. Trace anterolisthesis at L1-L2. 6 mm
anterolisthesis at L2-L3. 4 mm retrolisthesis at L5-S1. Findings are
unchanged from prior.

Vertebrae: Prior L3-L5 PLIF. Solid interbody and posterior fusion at
both levels. The right L3 pedicle screw approaches and slightly
violates the superior endplate (series 7, image 29). No evidence of
hardware loosening. No acute fracture or other focal pathologic
process. Unchanged L1 and L2 vertebral body hemangiomas.

Conus medullaris and cauda equina: Conus extends to the L2 level.
Clumping of the proximal cauda equina nerve roots.

Paraspinal and other soft tissues: Aortoiliac atherosclerotic
vascular disease. Bilateral sacroiliac joint osteoarthritis.

Disc levels:

T11-T12: Unchanged mild disc bulging with moderate left and mild
right facet arthropathy. No stenosis.

T12-L1: Mild disc bulging with interval increase in size of
superimposed left paracentral disc extrusion migrating superiorly.
Unchanged mild-to-moderate bilateral facet arthropathy. Unchanged
mild left lateral recess stenosis. No spinal canal or neuroforaminal
stenosis.

L1-L2: Unchanged mild-to-moderate disc bulging and endplate spurring
asymmetric to the right. Unchanged moderate right mild-to-moderate
left facet arthropathy. Unchanged mild spinal canal and right
greater than left lateral recess stenosis. Unchanged moderate right
and mild left neuroforaminal stenosis.

L2-L3: Unchanged moderate disc bulging and endplate spurring
asymmetric to the right with severe bilateral facet arthropathy.
Unchanged severe spinal canal stenosis with moderate right and mild
left neuroforaminal stenosis.

L3-L4: Prior posterior decompression and PLIF. No residual stenosis.

L4-L5: Prior posterior decompression and PLIF. No residual stenosis.

L5-S1: Prior posterior decompression. Unchanged bulky
circumferential disc osteophyte complex with severe left and
moderate right facet arthropathy. Unchanged moderate bilateral
lateral recess stenosis and severe bilateral neuroforaminal
stenosis. No spinal canal stenosis.
IMPRESSION: 1. Prior L3-L5 PLIF with solid fusion at both levels. The right L3
pedicle screw approaches and slightly violates the superior
endplate. Otherwise, no evidence of hardware complication.
2. Unchanged adjacent segment severe spinal canal stenosis at L2-L3.
3. Unchanged adjacent segment moderate bilateral lateral recess and
severe bilateral neuroforaminal stenosis at L5-S1.
4. Interval increase in size of superimposed left paracentral disc
extrusion at T12-L1 without high-grade stenosis or impingement.
5. Mild dynamic instability at L2-L3 and L5-S1.
6. Aortic Atherosclerosis ([J4]-[J4]).

## 2022-02-14 MED ORDER — MEPERIDINE HCL 50 MG/ML IJ SOLN
50.0000 mg | Freq: Once | INTRAMUSCULAR | Status: AC | PRN
  Administered 2022-02-14: 50 mg via INTRAMUSCULAR

## 2022-02-14 MED ORDER — ONDANSETRON HCL 4 MG/2ML IJ SOLN
4.0000 mg | Freq: Once | INTRAMUSCULAR | Status: AC | PRN
  Administered 2022-02-14: 4 mg via INTRAMUSCULAR

## 2022-02-14 MED ORDER — IOPAMIDOL (ISOVUE-M 200) INJECTION 41%
20.0000 mL | Freq: Once | INTRAMUSCULAR | Status: AC
  Administered 2022-02-14: 20 mL via INTRATHECAL

## 2022-02-14 MED ORDER — DIAZEPAM 5 MG PO TABS
5.0000 mg | ORAL_TABLET | Freq: Once | ORAL | Status: AC
  Administered 2022-02-14: 5 mg via ORAL

## 2022-02-14 NOTE — Discharge Instr - Other Orders (Addendum)
1058: pt reports pain 10/10 post myelogram procedure, see MAR.  ?1150: Pain 4/10, relief noted.  ?

## 2022-02-14 NOTE — Discharge Instructions (Signed)

## 2022-02-15 ENCOUNTER — Other Ambulatory Visit: Payer: Medicare Other

## 2022-03-22 ENCOUNTER — Ambulatory Visit
Admission: RE | Admit: 2022-03-22 | Discharge: 2022-03-22 | Disposition: A | Discharge status: Home / Self Care | Payer: Medicare Other | Source: Ambulatory Visit | Attending: Student | Admitting: Student

## 2022-03-22 DIAGNOSIS — N644 Mastodynia: Secondary | ICD-10-CM

## 2022-03-22 IMAGING — US US BREAST*R* LIMITED INC AXILLA
1 series · 2 of 2 positions shown · non-contrast
Comparison: Prior exams.

CLINICAL DATA: 65-year-old female with history of bilateral
mastectomies presenting for focal pain in the lateral aspect of the
right chest for 5 months.

EXAM:
ULTRASOUND OF THE RIGHT BREAST

[Series 1: us breast*right* limited inc axilla · 0.06mm/px · 2 of 2 slices shown]
[im 1/2]
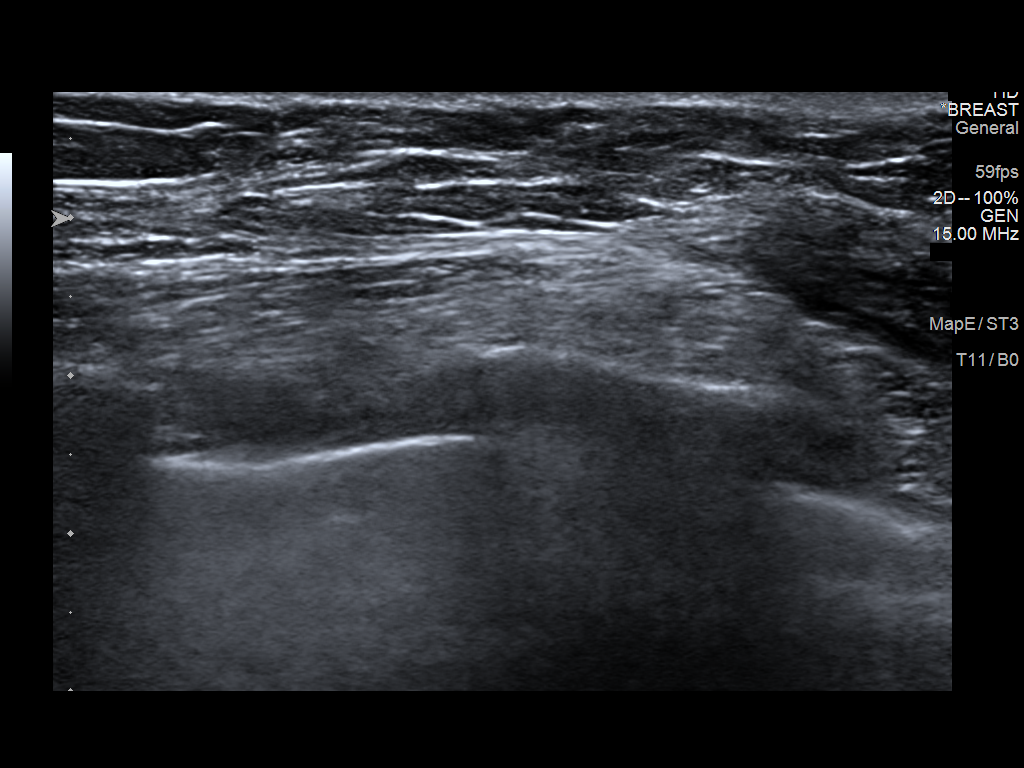
[im 2/2]
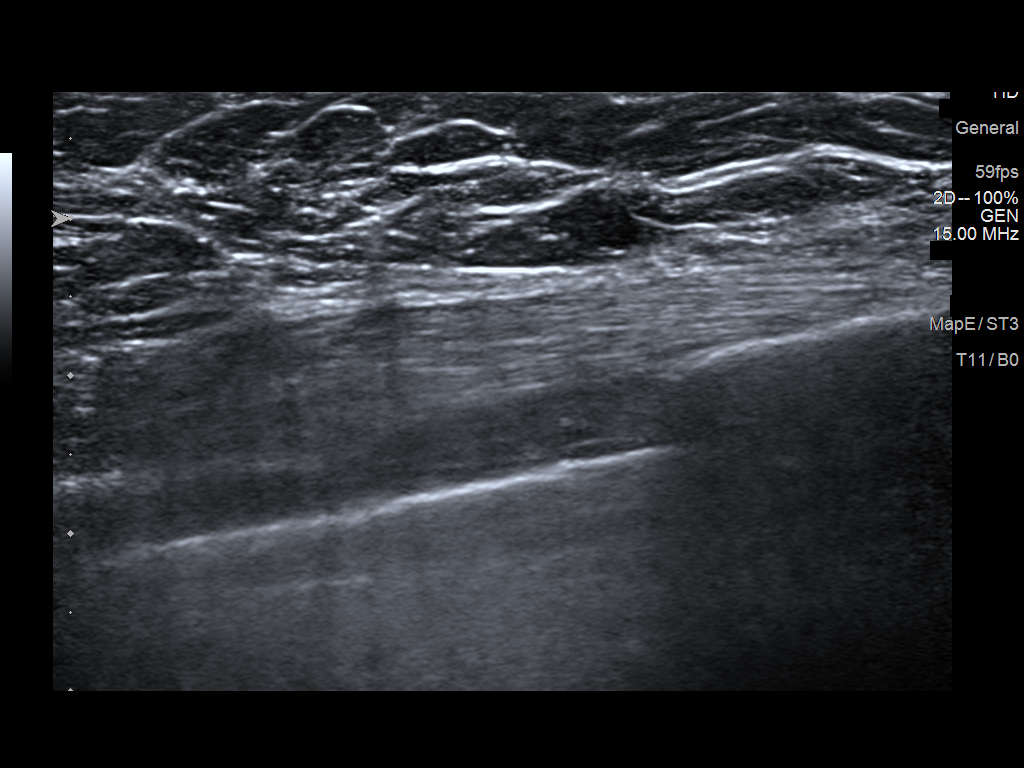

[2 of 2 positions shown; findings below may reference images not displayed]

FINDINGS: On physical exam. Of the site of focal pain, there is a soft fatty
outpouching along the lateral aspect of her mastectomy scar. No
palpable lumps are identified within the breast tissue

Targeted ultrasound is performed, showing normal subcutaneous
tissue. No suspicious masses or other abnormal sonographic findings
are identified.
IMPRESSION: No targeted sonographic abnormalities at the site of focal pain in
the lateral right chest.

RECOMMENDATION:
Clinical follow-up recommended for the focally tender area of
concern in the lateral right chest. Any further workup should be
based on clinical grounds.

I have discussed the findings and recommendations with the patient.
If applicable, a reminder letter will be sent to the patient
regarding the next appointment.

BI-RADS CATEGORY  1: Negative.

## 2022-04-26 ENCOUNTER — Ambulatory Visit: Payer: Self-pay | Admitting: Orthopedic Surgery

## 2022-04-26 DIAGNOSIS — M48062 Spinal stenosis, lumbar region with neurogenic claudication: Secondary | ICD-10-CM

## 2022-04-29 ENCOUNTER — Other Ambulatory Visit: Payer: Self-pay

## 2022-04-29 DIAGNOSIS — S41152A Open bite of left upper arm, initial encounter: Secondary | ICD-10-CM | POA: Insufficient documentation

## 2022-04-29 DIAGNOSIS — R519 Headache, unspecified: Secondary | ICD-10-CM | POA: Diagnosis not present

## 2022-04-29 DIAGNOSIS — E119 Type 2 diabetes mellitus without complications: Secondary | ICD-10-CM | POA: Insufficient documentation

## 2022-04-29 DIAGNOSIS — W57XXXA Bitten or stung by nonvenomous insect and other nonvenomous arthropods, initial encounter: Secondary | ICD-10-CM | POA: Diagnosis not present

## 2022-04-29 NOTE — ED Triage Notes (Addendum)
Found tick under left armpit tonight. Unsure of how long its been there, but noticed the area becoming sore tonight which made her look at site. Attempts at removal tonight unsuccessful.  States had nausea/vomiting on Thursday, but reports sx have resolved. Denies chest, denies sob. Denies fever at home.

## 2022-04-30 ENCOUNTER — Emergency Department
Admission: EM | Admit: 2022-04-30 | Discharge: 2022-04-30 | Disposition: A | Discharge status: Home / Self Care | Payer: Medicare Other | Attending: Emergency Medicine | Admitting: Emergency Medicine

## 2022-04-30 DIAGNOSIS — Z1839 Other retained organic fragments: Secondary | ICD-10-CM

## 2022-04-30 DIAGNOSIS — S41152A Open bite of left upper arm, initial encounter: Secondary | ICD-10-CM | POA: Diagnosis not present

## 2022-04-30 LAB — CBC WITH DIFFERENTIAL/PLATELET
Abs Immature Granulocytes: 0.02 10*3/uL (ref 0.00–0.07)
Basophils Absolute: 0 10*3/uL (ref 0.0–0.1)
Basophils Relative: 1 %
Eosinophils Absolute: 0.1 10*3/uL (ref 0.0–0.5)
Eosinophils Relative: 1 %
HCT: 35.5 % — ABNORMAL LOW (ref 36.0–46.0)
Hemoglobin: 10.8 g/dL — ABNORMAL LOW (ref 12.0–15.0)
Immature Granulocytes: 0 %
Lymphocytes Relative: 34 %
Lymphs Abs: 2.6 10*3/uL (ref 0.7–4.0)
MCH: 25.7 pg — ABNORMAL LOW (ref 26.0–34.0)
MCHC: 30.4 g/dL (ref 30.0–36.0)
MCV: 84.3 fL (ref 80.0–100.0)
Monocytes Absolute: 0.7 10*3/uL (ref 0.1–1.0)
Monocytes Relative: 10 %
Neutro Abs: 4.2 10*3/uL (ref 1.7–7.7)
Neutrophils Relative %: 54 %
Platelets: 267 10*3/uL (ref 150–400)
RBC: 4.21 MIL/uL (ref 3.87–5.11)
RDW: 15.7 % — ABNORMAL HIGH (ref 11.5–15.5)
WBC: 7.6 10*3/uL (ref 4.0–10.5)
nRBC: 0 % (ref 0.0–0.2)

## 2022-04-30 LAB — TROPONIN I (HIGH SENSITIVITY): Troponin I (High Sensitivity): 4 ng/L (ref <18)

## 2022-04-30 LAB — COMPREHENSIVE METABOLIC PANEL
ALT: 11 U/L (ref 0–44)
AST: 16 U/L (ref 15–41)
Albumin: 4 g/dL (ref 3.5–5.0)
Alkaline Phosphatase: 78 U/L (ref 38–126)
Anion gap: 6 (ref 5–15)
BUN: 26 mg/dL — ABNORMAL HIGH (ref 8–23)
CO2: 29 mmol/L (ref 22–32)
Calcium: 9 mg/dL (ref 8.9–10.3)
Chloride: 104 mmol/L (ref 98–111)
Creatinine, Ser: 0.97 mg/dL (ref 0.44–1.00)
GFR, Estimated: >60 mL/min (ref >60)
Glucose, Bld: 101 mg/dL — ABNORMAL HIGH (ref 70–99)
Potassium: 3.7 mmol/L (ref 3.5–5.1)
Sodium: 139 mmol/L (ref 135–145)
Total Bilirubin: 0.7 mg/dL (ref 0.3–1.2)
Total Protein: 7.8 g/dL (ref 6.5–8.1)

## 2022-04-30 MED ORDER — DOXYCYCLINE HYCLATE 100 MG PO TABS
100.0000 mg | ORAL_TABLET | Freq: Once | ORAL | Status: AC
  Administered 2022-04-30: 100 mg via ORAL
  Filled 2022-04-30: qty 1

## 2022-04-30 MED ORDER — DOXYCYCLINE HYCLATE 100 MG PO CAPS: 100.0000 mg | ORAL_CAPSULE | Freq: Two times a day (BID) | ORAL | 0 refills | Status: AC

## 2022-04-30 NOTE — ED Provider Notes (Signed)
[facility] Provider Note    Event Date/Time   First MD Initiated Contact with Patient 04/30/22 0033     (approximate)   History   Chief Complaint: Tick Removal   HPI  Candice Maldonado is a 66 y.o. female with a past history of hyperlipidemia, diabetes, GERD who comes ED complaining of an embedded tick in the left armpit, been there for several days.  She is also had a mild generalized headache for the last 2 days.  Denies fever or chills or rash.  Her daughter attempted to remove the tick but it broke apart.     Physical Exam   Triage Vital Signs: ED Triage Vitals [04/29/22 2352]  Enc Vitals Group     BP (!) 157/98     Pulse Rate 74     Resp 18     Temp 99.4 F (37.4 C)     Temp Source Oral     SpO2 99 %     Weight 210 lb (95.3 kg)     Height '5\' 5"'$  (1.651 m)     Head Circumference      Peak Flow      Pain Score 3     Pain Loc      Pain Edu?      Excl. in [place]?     Most recent vital signs: Vitals:   04/30/22 0139 04/30/22 0141  BP: (!) 145/81   Pulse:  80  Resp:    Temp:    SpO2:      General: Awake, no distress.  CV:  Good peripheral perfusion.  Regular rate and rhythm Resp:  Normal effort.  Abd:  No distention.  Other:  Tick fragment in the left axilla, no target rash.  Very mild erythema immediately surrounding the area.  No induration or tenderness.  No purulent drainage.  No body rash.   ED Results / Procedures / Treatments   Labs (all labs ordered are listed, but only abnormal results are displayed) Labs Reviewed  CBC WITH DIFFERENTIAL/PLATELET - Abnormal; Notable for the following components:      Result Value   Hemoglobin 10.8 (*)    HCT 35.5 (*)    MCH 25.7 (*)    RDW 15.7 (*)    All other components within normal limits  COMPREHENSIVE METABOLIC PANEL - Abnormal; Notable for the following components:   Glucose, Bld 101 (*)    BUN 26 (*)    All other components within normal limits  TROPONIN I (HIGH  SENSITIVITY)     EKG    RADIOLOGY    PROCEDURES:  .Foreign Body Removal  Date/Time: 04/30/2022 1:44 AM  Performed by: Carrie Mew, MD Authorized by: Carrie Mew, MD  Consent: Verbal consent obtained. Consent given by: patient Patient identity confirmed: verbally with patient and arm band Body area: skin General location: trunk Location details: left axilla  Sedation: Patient sedated: no  Patient restrained: no Patient cooperative: yes Removal mechanism: forceps Complexity: simple 1 objects recovered. Objects recovered: tick fragment Post-procedure assessment: foreign body removed Patient tolerance: patient tolerated the procedure well with no immediate complications     MEDICATIONS ORDERED IN ED: Medications  doxycycline (VIBRA-TABS) tablet 100 mg (100 mg Oral Given 04/30/22 0137)     IMPRESSION / MDM / ASSESSMENT AND PLAN / ED COURSE  I reviewed the triage vital signs and the nursing notes.  Patient presents with embedded tick fragment in the left axilla.  No signs of cellulitis of the area, but she does have some constitutional symptoms with mild headache, body temperature very slightly elevated at 99.4, concerning for possible tickborne infection.  We will start her on doxycycline.  Tick remnants were removed from the skin.  Area hemostatic.       FINAL CLINICAL IMPRESSION(S) / ED DIAGNOSES   Final diagnoses:  Embedded tick of axilla, left, initial encounter     Rx / DC Orders   ED Discharge Orders          Ordered    doxycycline (VIBRAMYCIN) 100 MG capsule  2 times daily        04/30/22 0141             Note:  This document was prepared using Dragon voice recognition software and may include unintentional dictation errors.   Carrie Mew, MD 04/30/22 [phone]

## 2022-06-01 ENCOUNTER — Ambulatory Visit: Admit: 2022-06-01 | Payer: Medicare Other | Admitting: Specialist

## 2022-06-01 SURGERY — LUMBAR LAMINECTOMY/DECOMPRESSION MICRODISCECTOMY 2 LEVELS
Anesthesia: General

## 2022-08-15 ENCOUNTER — Emergency Department
Admission: EM | Admit: 2022-08-15 | Discharge: 2022-08-15 | Disposition: A | Discharge status: Home / Self Care | Payer: Medicare Other | Attending: Emergency Medicine | Admitting: Emergency Medicine

## 2022-08-15 ENCOUNTER — Emergency Department: Payer: Medicare Other

## 2022-08-15 ENCOUNTER — Encounter: Payer: Self-pay | Admitting: Emergency Medicine

## 2022-08-15 ENCOUNTER — Other Ambulatory Visit: Payer: Self-pay

## 2022-08-15 DIAGNOSIS — M5030 Other cervical disc degeneration, unspecified cervical region: Secondary | ICD-10-CM | POA: Diagnosis not present

## 2022-08-15 DIAGNOSIS — M5412 Radiculopathy, cervical region: Secondary | ICD-10-CM | POA: Diagnosis not present

## 2022-08-15 DIAGNOSIS — E119 Type 2 diabetes mellitus without complications: Secondary | ICD-10-CM | POA: Insufficient documentation

## 2022-08-15 DIAGNOSIS — R0789 Other chest pain: Secondary | ICD-10-CM | POA: Diagnosis not present

## 2022-08-15 DIAGNOSIS — Z853 Personal history of malignant neoplasm of breast: Secondary | ICD-10-CM | POA: Diagnosis not present

## 2022-08-15 DIAGNOSIS — R202 Paresthesia of skin: Secondary | ICD-10-CM | POA: Diagnosis present

## 2022-08-15 LAB — BASIC METABOLIC PANEL
Anion gap: 8 (ref 5–15)
BUN: 30 mg/dL — ABNORMAL HIGH (ref 8–23)
CO2: 24 mmol/L (ref 22–32)
Calcium: 8.9 mg/dL (ref 8.9–10.3)
Chloride: 109 mmol/L (ref 98–111)
Creatinine, Ser: 1.2 mg/dL — ABNORMAL HIGH (ref 0.44–1.00)
GFR, Estimated: 50 mL/min — ABNORMAL LOW (ref >60)
Glucose, Bld: 127 mg/dL — ABNORMAL HIGH (ref 70–99)
Potassium: 3.8 mmol/L (ref 3.5–5.1)
Sodium: 141 mmol/L (ref 135–145)

## 2022-08-15 LAB — CBC
HCT: 36.2 % (ref 36.0–46.0)
Hemoglobin: 11.5 g/dL — ABNORMAL LOW (ref 12.0–15.0)
MCH: 25.7 pg — ABNORMAL LOW (ref 26.0–34.0)
MCHC: 31.8 g/dL (ref 30.0–36.0)
MCV: 81 fL (ref 80.0–100.0)
Platelets: 247 10*3/uL (ref 150–400)
RBC: 4.47 MIL/uL (ref 3.87–5.11)
RDW: 14.9 % (ref 11.5–15.5)
WBC: 6.2 10*3/uL (ref 4.0–10.5)
nRBC: 0 % (ref 0.0–0.2)

## 2022-08-15 LAB — TROPONIN I (HIGH SENSITIVITY): Troponin I (High Sensitivity): 3 ng/L (ref <18)

## 2022-08-15 MED ORDER — PREDNISONE 20 MG PO TABS: ORAL_TABLET | ORAL | 0 refills | Status: DC

## 2022-08-15 NOTE — Discharge Instructions (Addendum)
Call make appointment with with [facility] orthopedic department if any continued problems with the cervical radiculopathy.  A prescription was sent to the pharmacy for you to begin taking once a day until you have ran out.  Continue with your regular medication.   Follow-up with your primary care provider if any continued problems or concerns.

## 2022-08-15 NOTE — ED Provider Notes (Signed)
[facility] Provider Note    Event Date/Time   First MD Initiated Contact with Patient 08/15/22 1230     (approximate)   History   Chest Pain   HPI  Candice Maldonado is a 66 y.o. female   presents to the ED with complaint of tingling down her left arm that has been ongoing for several days without history of injury.  Patient also complained of some tightness in her chest but denies any cough, congestion, fever, chills, indigestion, shortness of breath or diaphoresis.  Patient denies any recent injury to her cervical spine but does report that recently the orthopedist wanted to do surgery on her lower back and she canceled the surgery.  Patient has history of depression, diabetes, neuromuscular disorder, breast cancer and arthritis.      Physical Exam   Triage Vital Signs: ED Triage Vitals  Enc Vitals Group     BP 08/15/22 1134 129/88     Pulse Rate 08/15/22 1134 97     Resp 08/15/22 1134 18     Temp 08/15/22 1134 98.2 F (36.8 C)     Temp Source 08/15/22 1134 Oral     SpO2 08/15/22 1134 99 %     Weight 08/15/22 1135 215 lb (97.5 kg)     Height --      Head Circumference --      Peak Flow --      Pain Score 08/15/22 1135 8     Pain Loc --      Pain Edu? --      Excl. in [place]? --     Most recent vital signs: Vitals:   08/15/22 1134  BP: 129/88  Pulse: 97  Resp: 18  Temp: 98.2 F (36.8 C)  SpO2: 99%     General: Awake, no distress.  CV:  Good peripheral perfusion.  Heart regular rate and rhythm. Resp:  Normal effort.  Clear bilaterally. Abd:  No distention.  Other:  No point tenderness on palpation of cervical spine posteriorly.  Range of motion is without restriction.  Patient is able to move upper extremities without any difficulty and no tenderness is noted on palpation of the left upper extremity.  Pulses are present.  Grip strength is equal.  Motor or sensory function intact.   ED Results / Procedures / Treatments    Labs (all labs ordered are listed, but only abnormal results are displayed) Labs Reviewed  BASIC METABOLIC PANEL - Abnormal; Notable for the following components:      Result Value   Glucose, Bld 127 (*)    BUN 30 (*)    Creatinine, Ser 1.20 (*)    GFR, Estimated 50 (*)    All other components within normal limits  CBC - Abnormal; Notable for the following components:   Hemoglobin 11.5 (*)    MCH 25.7 (*)    All other components within normal limits  TROPONIN I (HIGH SENSITIVITY)     EKG  Vent. rate 91 BPM PR interval 156 ms QRS duration 96 ms QT/QTcB 382/469 ms P-R-T axes 63 -29 69 Normal sinus rhythm Normal ECG When compared with ECG of 29-Apr-2022 23:59, QRS axis Shifted left   RADIOLOGY Chest x-ray images were reviewed by myself independent of the radiologist and was negative.  Radiology report also confirms no active cardiopulmonary disease. Cervical spine x-rays were reviewed and interpreted by myself and show degenerative changes but no compression fracture noted.  Radiology report confirms degenerative changes  C3-C6.   PROCEDURES:  Critical Care performed:   Procedures   MEDICATIONS ORDERED IN ED: Medications - No data to display   IMPRESSION / MDM / [place] / ED COURSE  I reviewed the triage vital signs and the nursing notes.   Differential diagnosis includes, but is not limited to, chest pain, MI, chest wall pain nonspecific, cervical radiculopathy.  66 year old female presents to the ED with complaint of anterior chest wall pain with left shoulder tingling as she describes feels like she slept on it.  Troponin was reassuring at 3, CBC was essentially negative as was her BMP.  Patient has had elevated creatinines at her PCPs office and today creatinine is 1.2.  Chest x-ray showed no active cardiopulmonary disease.  Cervical spine does show degenerative disc disease C3-C6 which most likely is because of her cervical radiculopathy.  Patient  was made aware.  She agrees to a short course of steroids to help with the sensation and will follow-up with her PCP or Dr. Karel Jarvis, who is on-call for orthopedics if any continued problems.  Patient is agreeable with this plan.      Patient's presentation is most consistent with acute complicated illness / injury requiring diagnostic workup.  FINAL CLINICAL IMPRESSION(S) / ED DIAGNOSES   Final diagnoses:  Degenerative disc disease, cervical  Cervical radiculopathy     Rx / DC Orders   ED Discharge Orders          Ordered    predniSONE (DELTASONE) 20 MG tablet        08/15/22 1354             Note:  This document was prepared using Dragon voice recognition software and may include unintentional dictation errors.   Johnn Hai, PA-C 08/15/22 1525    Harvest Dark, MD 08/16/22 1453

## 2022-08-15 NOTE — ED Triage Notes (Signed)
Pt sts that she has been having tingling going down her left arm and tightness in her chest. Pt sts that she did have chest pain last Tuesday and Wednesday.

## 2022-08-25 ENCOUNTER — Other Ambulatory Visit: Payer: Self-pay | Admitting: Orthopedic Surgery

## 2022-08-25 DIAGNOSIS — M5030 Other cervical disc degeneration, unspecified cervical region: Secondary | ICD-10-CM

## 2022-08-25 DIAGNOSIS — M4802 Spinal stenosis, cervical region: Secondary | ICD-10-CM

## 2022-08-25 DIAGNOSIS — M5412 Radiculopathy, cervical region: Secondary | ICD-10-CM

## 2022-09-08 ENCOUNTER — Ambulatory Visit
Admission: RE | Admit: 2022-09-08 | Discharge: 2022-09-08 | Disposition: A | Discharge status: Home / Self Care | Payer: Medicare Other | Source: Ambulatory Visit | Attending: Orthopedic Surgery | Admitting: Orthopedic Surgery

## 2022-09-08 DIAGNOSIS — M4802 Spinal stenosis, cervical region: Secondary | ICD-10-CM

## 2022-09-08 DIAGNOSIS — M5030 Other cervical disc degeneration, unspecified cervical region: Secondary | ICD-10-CM

## 2022-09-08 DIAGNOSIS — M5412 Radiculopathy, cervical region: Secondary | ICD-10-CM

## 2022-09-25 ENCOUNTER — Emergency Department: Payer: Medicare Other

## 2022-09-25 ENCOUNTER — Emergency Department
Admission: EM | Admit: 2022-09-25 | Discharge: 2022-09-25 | Disposition: A | Discharge status: Home / Self Care | Payer: Medicare Other | Attending: Emergency Medicine | Admitting: Emergency Medicine

## 2022-09-25 DIAGNOSIS — W19XXXA Unspecified fall, initial encounter: Secondary | ICD-10-CM

## 2022-09-25 DIAGNOSIS — R531 Weakness: Secondary | ICD-10-CM | POA: Diagnosis not present

## 2022-09-25 DIAGNOSIS — W109XXA Fall (on) (from) unspecified stairs and steps, initial encounter: Secondary | ICD-10-CM | POA: Diagnosis not present

## 2022-09-25 DIAGNOSIS — M25552 Pain in left hip: Secondary | ICD-10-CM | POA: Diagnosis not present

## 2022-09-25 DIAGNOSIS — M25512 Pain in left shoulder: Secondary | ICD-10-CM | POA: Diagnosis present

## 2022-09-25 LAB — CBC WITH DIFFERENTIAL/PLATELET
Abs Immature Granulocytes: 0.03 10*3/uL (ref 0.00–0.07)
Basophils Absolute: 0 10*3/uL (ref 0.0–0.1)
Basophils Relative: 0 %
Eosinophils Absolute: 0 10*3/uL (ref 0.0–0.5)
Eosinophils Relative: 0 %
HCT: 36.2 % (ref 36.0–46.0)
Hemoglobin: 11.2 g/dL — ABNORMAL LOW (ref 12.0–15.0)
Immature Granulocytes: 0 %
Lymphocytes Relative: 25 %
Lymphs Abs: 2 10*3/uL (ref 0.7–4.0)
MCH: 25.9 pg — ABNORMAL LOW (ref 26.0–34.0)
MCHC: 30.9 g/dL (ref 30.0–36.0)
MCV: 83.6 fL (ref 80.0–100.0)
Monocytes Absolute: 0.7 10*3/uL (ref 0.1–1.0)
Monocytes Relative: 9 %
Neutro Abs: 5.2 10*3/uL (ref 1.7–7.7)
Neutrophils Relative %: 66 %
Platelets: 234 10*3/uL (ref 150–400)
RBC: 4.33 MIL/uL (ref 3.87–5.11)
RDW: 14.6 % (ref 11.5–15.5)
WBC: 8 10*3/uL (ref 4.0–10.5)
nRBC: 0 % (ref 0.0–0.2)

## 2022-09-25 LAB — COMPREHENSIVE METABOLIC PANEL
ALT: 9 U/L (ref 0–44)
AST: 17 U/L (ref 15–41)
Albumin: 3.7 g/dL (ref 3.5–5.0)
Alkaline Phosphatase: 55 U/L (ref 38–126)
Anion gap: 6 (ref 5–15)
BUN: 28 mg/dL — ABNORMAL HIGH (ref 8–23)
CO2: 29 mmol/L (ref 22–32)
Calcium: 9.2 mg/dL (ref 8.9–10.3)
Chloride: 103 mmol/L (ref 98–111)
Creatinine, Ser: 1.38 mg/dL — ABNORMAL HIGH (ref 0.44–1.00)
GFR, Estimated: 42 mL/min — ABNORMAL LOW (ref >60)
Glucose, Bld: 106 mg/dL — ABNORMAL HIGH (ref 70–99)
Potassium: 3.6 mmol/L (ref 3.5–5.1)
Sodium: 138 mmol/L (ref 135–145)
Total Bilirubin: 0.5 mg/dL (ref 0.3–1.2)
Total Protein: 7.2 g/dL (ref 6.5–8.1)

## 2022-09-25 LAB — URINALYSIS, ROUTINE W REFLEX MICROSCOPIC
Bilirubin Urine: NEGATIVE
Glucose, UA: NEGATIVE mg/dL
Ketones, ur: NEGATIVE mg/dL
Leukocytes,Ua: NEGATIVE
Nitrite: NEGATIVE
Protein, ur: 30 mg/dL — AB
Specific Gravity, Urine: 1.018 (ref 1.005–1.030)
pH: 5 (ref 5.0–8.0)

## 2022-09-25 LAB — TROPONIN I (HIGH SENSITIVITY): Troponin I (High Sensitivity): 4 ng/L (ref <18)

## 2022-09-25 LAB — CK: Total CK: 93 U/L (ref 38–234)

## 2022-09-25 MED ORDER — CEPHALEXIN 500 MG PO CAPS: 500.0000 mg | ORAL_CAPSULE | Freq: Four times a day (QID) | ORAL | 0 refills | Status: AC

## 2022-09-25 NOTE — ED Triage Notes (Signed)
Fall today down stairs around 215pm Pt daughter states pt story has changed several times and is unsure if she had +LOC.  Pt takes ASA daily. Pt states she had episode of dizziness and vomiting prior to fall.  Pt currently A&O x 4 in triage.  Provider in triage for further eval at this time.

## 2022-09-25 NOTE — ED Provider Notes (Signed)
[facility] Provider Note  Patient Contact: 9:49 PM (approximate)   History   Fall   HPI  Candice Maldonado is a 66 y.o. female presents to the emergency department after a mechanical fall.  Patient reportedly fell down several steps after she had an episode of vomiting.  She states that she typically has baseline vomiting and diarrhea due to GI issues.  She denies chest pain, chest tightness or shortness of breath.  She did not sustain any abrasions or lacerations during fall.  She is complaining of some left shoulder pain and left hip pain.  Patient was unsure if she hit her head.  She is not currently anticoagulated.      Physical Exam   Triage Vital Signs: ED Triage Vitals  Enc Vitals Group     BP 09/25/22 1953 135/79     Pulse Rate 09/25/22 1953 66     Resp 09/25/22 1953 19     Temp 09/25/22 1953 98.4 F (36.9 C)     Temp Source 09/25/22 1953 Oral     SpO2 09/25/22 1953 98 %     Weight 09/25/22 1951 214 lb (97.1 kg)     Height 09/25/22 1951 '5\' 5"'$  (1.651 m)     Head Circumference --      Peak Flow --      Pain Score 09/25/22 1951 6     Pain Loc --      Pain Edu? --      Excl. in [place]? --     Most recent vital signs: Vitals:   09/25/22 1953  BP: 135/79  Pulse: 66  Resp: 19  Temp: 98.4 F (36.9 C)  SpO2: 98%     General: Alert and in no acute distress. Eyes:  PERRL. EOMI. Head: No acute traumatic findings ENT:      Nose: No congestion/rhinnorhea.      Mouth/Throat: Mucous membranes are moist. Neck: No stridor. No cervical spine tenderness to palpation. Cardiovascular:  Good peripheral perfusion Respiratory: Normal respiratory effort without tachypnea or retractions. Lungs CTAB. Good air entry to the bases with no decreased or absent breath sounds. Gastrointestinal: Bowel sounds 4 quadrants. Soft and nontender to palpation. No guarding or rigidity. No palpable masses. No distention. No CVA tenderness. Musculoskeletal:  Patient has symmetric strength in the upper and lower extremities.  Full range of motion to all extremities.  Neurologic:  No gross focal neurologic deficits are appreciated.  Skin:   No rash noted Other:   ED Results / Procedures / Treatments   Labs (all labs ordered are listed, but only abnormal results are displayed) Labs Reviewed  COMPREHENSIVE METABOLIC PANEL - Abnormal; Notable for the following components:      Result Value   Glucose, Bld 106 (*)    BUN 28 (*)    Creatinine, Ser 1.38 (*)    GFR, Estimated 42 (*)    All other components within normal limits  CBC WITH DIFFERENTIAL/PLATELET - Abnormal; Notable for the following components:   Hemoglobin 11.2 (*)    MCH 25.9 (*)    All other components within normal limits  URINALYSIS, ROUTINE W REFLEX MICROSCOPIC - Abnormal; Notable for the following components:   Color, Urine YELLOW (*)    APPearance HAZY (*)    Hgb urine dipstick SMALL (*)    Protein, ur 30 (*)    Bacteria, UA MANY (*)    All other components within normal limits  CK  TROPONIN I (HIGH  SENSITIVITY)  TROPONIN I (HIGH SENSITIVITY)     EKG  Normal sinus rhythm without ST segment elevation or other apparent arrhythmia.   RADIOLOGY  I personally viewed and evaluated these images as part of my medical decision making, as well as reviewing the written report by the radiologist.  ED Provider Interpretation: CTs of the head and cervical spine unremarkable.  No acute bony abnormality on x-ray of the left hip.  No evidence of pneumothorax on chest x-ray.  No acute bony abnormality on x-ray of the left humerus and left shoulder.   PROCEDURES:  Critical Care performed: No  Procedures   MEDICATIONS ORDERED IN ED: Medications - No data to display   IMPRESSION / MDM / [place] / ED COURSE  I reviewed the triage vital signs and the nursing notes.                              Assessment and plan Fall UTI 66 year old female presents to  the emergency department after a mechanical fall.  Vital signs are reassuring at triage.   CBC and CMP consistent with patient's baseline.  Urinalysis concerning for UTI.  CK within range.  Troponin within range.  CTs of the head and cervical spine show no acute abnormality.  X-rays of the left shoulder and left humerus unremarkable for acute fracture.  No evidence of pneumothorax or rib fracture on chest x-ray.  Patient was discharged with Keflex for UTI.  Recommended Tylenol as needed for discomfort.  Return precautions were given to return with new or worsening symptoms.  FINAL CLINICAL IMPRESSION(S) / ED DIAGNOSES   Final diagnoses:  Fall, initial encounter     Rx / DC Orders   ED Discharge Orders          Ordered    cephALEXin (KEFLEX) 500 MG capsule  4 times daily        09/25/22 2141             Note:  This document was prepared using Dragon voice recognition software and may include unintentional dictation errors.   Vallarie M[place], PA-C 09/25/22 2152    Duffy Bruce, MD 09/26/22 [phone]

## 2022-09-25 NOTE — Discharge Instructions (Addendum)
Take two tablets of Keflex in the morning and two tablets at night.

## 2022-09-25 NOTE — ED Provider Triage Note (Signed)
Emergency Medicine Provider Triage Evaluation Note  Candice Maldonado , a 66 y.o. female  was evaluated in triage.  Pt complains of fall, weakness, MSK pain. Reportedly fell down a flight of stairs. Felt to weak to get up. Potentially in the floor 4-6 hours. C/O HA, shoulder pain, hip pain. Daughter says patient is confused  Review of Systems  Positive: Fall, weakness, AMS Negative: CP, abd pain  Physical Exam  BP 135/79 (BP Location: Right Arm)   Pulse 66   Temp 98.4 F (36.9 C) (Oral)   Resp 19   Ht '5\' 5"'$  (1.651 m)   Wt 97.1 kg   SpO2 98%   BMI 35.61 kg/m  Gen:   Awake, no distress   Resp:  Normal effort  MSK:   Moves extremities without difficulty  Other:    Medical Decision Making  Medically screening exam initiated at 7:54 PM.  Appropriate orders placed.  Candice Maldonado was informed that the remainder of the evaluation will be completed by another provider, this initial triage assessment does not replace that evaluation, and the importance of remaining in the ED until their evaluation is complete.  Fall, confusion. Labs, imaging, urinalysis, EKG   Darletta Moll, PA-C 09/25/22 1954

## 2022-12-14 ENCOUNTER — Other Ambulatory Visit: Payer: Self-pay

## 2022-12-14 ENCOUNTER — Emergency Department
Admission: EM | Admit: 2022-12-14 | Discharge: 2022-12-14 | Disposition: A | Discharge status: Home / Self Care | Payer: 59 | Attending: Physician Assistant | Admitting: Physician Assistant

## 2022-12-14 DIAGNOSIS — M48061 Spinal stenosis, lumbar region without neurogenic claudication: Secondary | ICD-10-CM | POA: Insufficient documentation

## 2022-12-14 DIAGNOSIS — E119 Type 2 diabetes mellitus without complications: Secondary | ICD-10-CM | POA: Diagnosis not present

## 2022-12-14 DIAGNOSIS — M545 Low back pain, unspecified: Secondary | ICD-10-CM | POA: Diagnosis present

## 2022-12-14 MED ORDER — TIZANIDINE HCL 4 MG PO TABS: 4.0000 mg | ORAL_TABLET | Freq: Three times a day (TID) | ORAL | 0 refills | Status: DC | PRN

## 2022-12-14 MED ORDER — KETOROLAC TROMETHAMINE 30 MG/ML IJ SOLN
30.0000 mg | Freq: Once | INTRAMUSCULAR | Status: AC
  Administered 2022-12-14: 30 mg via INTRAMUSCULAR
  Filled 2022-12-14: qty 1

## 2022-12-14 MED ORDER — LIDOCAINE 5 % EX PTCH
1.0000 | MEDICATED_PATCH | Freq: Once | CUTANEOUS | Status: DC
  Administered 2022-12-14: 1 via TRANSDERMAL
  Filled 2022-12-14: qty 1

## 2022-12-14 MED ORDER — PREGABALIN 150 MG PO CAPS: 300.0000 mg | ORAL_CAPSULE | Freq: Two times a day (BID) | ORAL | 0 refills | Status: DC

## 2022-12-14 MED ORDER — PREDNISONE 20 MG PO TABS: 40.0000 mg | ORAL_TABLET | Freq: Every day | ORAL | 0 refills | Status: AC

## 2022-12-14 NOTE — Discharge Instructions (Signed)
Your exam is consistent with acute on chronic exacerbation of your underlying spinal stenosis and DDD.  Take the prescription meds as directed.  Including a trial of Zanaflex and place of your Robaxin/baclofen.  Also we will start the Lyrica at a higher dose intended for nerve pain from the spinal cord.  Take 150 mg twice daily for the next 3 days, and then increase to 2 tablets twice daily for the duration of this new prescription.  Follow-up with your primary provider or pain management specialist for ongoing management and care.  Return to the ED if needed.

## 2022-12-14 NOTE — ED Provider Notes (Signed)
[facility] Emergency Department Provider Note     None    (approximate)   History   Back Pain   HPI  Candice Maldonado is a 67 y.o. female with a history of obesity, arthritis, diabetes, GERD, DDD, and ductal carcinoma in situ of the left breast, presents to the ED for low back pain.  Patient reports pain with referral down both legs, left greater than right.  She reports onset of symptoms yesterday while standing at the sink doing dishes.  Patient denies any bladder or bowel incontinence, foot drop, or saddle anesthesia.  Does give history of multiple back operations.  Physical Exam   Triage Vital Signs: ED Triage Vitals  Enc Vitals Group     BP 12/14/22 2031 (!) 142/82     Pulse Rate 12/14/22 2031 92     Resp 12/14/22 2031 17     Temp 12/14/22 2031 98.2 F (36.8 C)     Temp Source 12/14/22 2031 Oral     SpO2 12/14/22 2031 99 %     Weight 12/14/22 2035 217 lb (98.4 kg)     Height 12/14/22 2035 '5\' 4"'$  (1.626 m)     Head Circumference --      Peak Flow --      Pain Score 12/14/22 2034 10     Pain Loc --      Pain Edu? --      Excl. in [place]? --     Most recent vital signs: Vitals:   12/14/22 2031  BP: (!) 142/82  Pulse: 92  Resp: 17  Temp: 98.2 F (36.8 C)  SpO2: 99%    General Awake, no distress. NAD CV:  Good peripheral perfusion.  RESP:  Normal effort.  ABD:  No distention.  MSK:  Spinal alignment without significant midline tenderness, spasm, deformity, step-off.  Patient normothermic motion of low upper and lower extremities. NEURO: Cranial nerves II to XII grossly intact.  Normal toe dorsiflexion and foot eversion on exam.  ED Results / Procedures / Treatments   Labs (all labs ordered are listed, but only abnormal results are displayed) Labs Reviewed - No data to display   EKG   RADIOLOGY   No results found.   PROCEDURES:  Critical Care performed: No  Procedures   MEDICATIONS ORDERED IN  ED: Medications  lidocaine (LIDODERM) 5 % 1 patch (1 patch Transdermal Patch Applied 12/14/22 2214)  ketorolac (TORADOL) 30 MG/ML injection 30 mg (30 mg Intramuscular Given 12/14/22 2214)     IMPRESSION / MDM / [place] / ED COURSE  I reviewed the triage vital signs and the nursing notes.                              Differential diagnosis includes, but is not limited to, sciatica, radicular pain, spinal stenosis  Patient's presentation is most consistent with acute, uncomplicated illness.  Patient's diagnosis is consistent with acute on chronic back pain with a history of DDD and spinal stenosis.  I spent significant amount of time discussing patient's current treatment plans including options for spinal cord stimulator versus lumbar fusion revision surgery.  Also reviewed the patient medications, and suggested that she make some adjustments including up dosing her Lyrica for neurogenic pain relief.  Patient will be discharged home with prescriptions for Lyrica, tizanidine, and prednisone. Patient is to follow up with her primary provider, pain management specialist, or orthospine  specialist, as needed or otherwise directed. Patient is given ED precautions to return to the ED for any worsening or new symptoms.  FINAL CLINICAL IMPRESSION(S) / ED DIAGNOSES   Final diagnoses:  Spinal stenosis of lumbar region, unspecified whether neurogenic claudication present     Rx / DC Orders   ED Discharge Orders          Ordered    tiZANidine (ZANAFLEX) 4 MG tablet  Every 8 hours PRN        12/14/22 2206    predniSONE (DELTASONE) 20 MG tablet  Daily with breakfast        12/14/22 2206    pregabalin (LYRICA) 150 MG capsule  2 times daily        12/14/22 2248             Note:  This document was prepared using Dragon voice recognition software and may include unintentional dictation errors.    Melvenia Needles, PA-C 12/14/22 2258    Nance Pear, MD 12/14/22  2259

## 2022-12-14 NOTE — ED Triage Notes (Signed)
Pt presents to ER with c/o lower back pain with radiation down BIL legs with left side in greater pain than right.  Pt states pain started yesterday evening while washing dishes.  Pt has some numbness in her left leg on palpation.  Pt has hx of DDD and multiple back operations in past.  Pt is ambulatory on request, A&O x4 and in NAD at this time.

## 2023-06-06 ENCOUNTER — Other Ambulatory Visit: Payer: Self-pay

## 2023-06-06 ENCOUNTER — Emergency Department
Admission: EM | Admit: 2023-06-06 | Discharge: 2023-06-06 | Disposition: A | Discharge status: Home / Self Care | Payer: 59 | Attending: Emergency Medicine | Admitting: Emergency Medicine

## 2023-06-06 DIAGNOSIS — W269XXA Contact with unspecified sharp object(s), initial encounter: Secondary | ICD-10-CM | POA: Insufficient documentation

## 2023-06-06 DIAGNOSIS — S6991XA Unspecified injury of right wrist, hand and finger(s), initial encounter: Secondary | ICD-10-CM | POA: Diagnosis present

## 2023-06-06 DIAGNOSIS — S61210A Laceration without foreign body of right index finger without damage to nail, initial encounter: Secondary | ICD-10-CM | POA: Diagnosis not present

## 2023-06-06 DIAGNOSIS — Z23 Encounter for immunization: Secondary | ICD-10-CM | POA: Diagnosis not present

## 2023-06-06 DIAGNOSIS — E119 Type 2 diabetes mellitus without complications: Secondary | ICD-10-CM | POA: Diagnosis not present

## 2023-06-06 MED ORDER — TETANUS-DIPHTH-ACELL PERTUSSIS 5-2.5-18.5 LF-MCG/0.5 IM SUSY
0.5000 mL | PREFILLED_SYRINGE | Freq: Once | INTRAMUSCULAR | Status: AC
  Administered 2023-06-06: 0.5 mL via INTRAMUSCULAR
  Filled 2023-06-06: qty 0.5

## 2023-06-06 NOTE — ED Provider Notes (Cosign Needed Addendum)
[facility] Emergency Department Provider Note     Event Date/Time   First MD Initiated Contact with Patient 06/06/23 1520     (approximate)   History   Laceration   HPI  Candice Maldonado is a 67 y.o. female with a history of HLD and diabetes presents to the ED with laceration to right index finger.  Patient reports she was picking up a toaster at home and sliced her finger on the inside of the toaster.  Patient immediately cleaned the laceration with soap and water and apply direct pressure.  Bleeding is controlled at this time.  Denies numbness and decreased range of motion.  Unknown tetanus status.      Physical Exam   Triage Vital Signs: ED Triage Vitals  Encounter Vitals Group     BP 06/06/23 1509 (!) 161/106     Systolic BP Percentile --      Diastolic BP Percentile --      Pulse Rate 06/06/23 1527 67     Resp 06/06/23 1527 18     Temp --      Temp src --      SpO2 06/06/23 1509 97 %     Weight --      Height --      Head Circumference --      Peak Flow --      Pain Score 06/06/23 1508 0     Pain Loc --      Pain Education --      Exclude from Growth Chart --     Most recent vital signs: Vitals:   06/06/23 1509 06/06/23 1527  BP: (!) 161/106   Pulse:  67  Resp:  18  SpO2: 97% 97%    General Awake, no distress.  CV:  Good peripheral perfusion.  RESP:  Normal effort.  ABD:  No distention.  Other:  Right index finger reveals approximately 1 cm closely approximated laceration to the palmar aspect distal the DIP joint.  neurovascular status intact.  Full AROM.  Capillary refills brisk and normal.    ED Results / Procedures / Treatments   Labs (all labs ordered are listed, but only abnormal results are displayed) Labs Reviewed - No data to display  No results found.  PROCEDURES:  Critical Care performed: No  ..Laceration Repair  Date/Time: 06/06/2023 4:25 PM  Performed by: Conrad [place], PA-C Authorized  by: Conrad [place], PA-C   Consent:    Consent obtained:  Verbal   Consent given by:  Patient   Risks discussed:  Infection and pain Universal protocol:    Patient identity confirmed:  Verbally with patient Anesthesia:    Anesthesia method:  None Laceration details:    Location:  Finger   Finger location:  R index finger   Length (cm):  1 Exploration:    Hemostasis achieved with:  Direct pressure Treatment:    Area cleansed with:  Chlorhexidine   Amount of cleaning:  Standard Skin repair:    Repair method:  Steri-Strips and tissue adhesive   Number of Steri-Strips:  1 Approximation:    Approximation:  Close Repair type:    Repair type:  Simple Post-procedure details:    Dressing:  Adhesive bandage   Procedure completion:  Tolerated   MEDICATIONS ORDERED IN ED: Medications  Tdap (BOOSTRIX) injection 0.5 mL (0.5 mLs Intramuscular Given 06/06/23 1613)   IMPRESSION / MDM / ASSESSMENT AND PLAN / ED COURSE  I reviewed  the triage vital signs and the nursing notes.                               67 y.o. female presents to the emergency department for evaluation and treatment of right index finger laceration. See HPI for further details.   Patient's presentation is most consistent with acute complicated illness / injury requiring diagnostic workup.  BP noted with elevation at 161/106 from lower limb read.  Patient reports history of double mastectomy given why she cannot have her blood pressure taken on her upper extremity.  Reading is more than likely an accurate read.   Tetanus not up-to-date.  Tetanus will beadministered at today's ED visit.  Please see procedure note.  Given the superficial nature of the laceration.  Compression dressing with Steri-Strips performed.  Patient tolerated well.  Laceration care and wound change information packet provided.  Patient is given ED precautions to return to the ED for any worsening or new symptoms. Patient verbalizes understanding.  All questions and concerns were addressed during ED visit.     FINAL CLINICAL IMPRESSION(S) / ED DIAGNOSES   Final diagnoses:  Laceration of right index finger without foreign body without damage to nail, initial encounter   Rx / DC Orders   ED Discharge Orders     None        Note:  This document was prepared using Dragon voice recognition software and may include unintentional dictation errors.    Kern Reap A, PA-C 06/06/23 1620    Romeo Apple, Tanaiya Kolarik A, PA-C 06/06/23 1627    Corena Herter, MD 06/07/23 [phone]

## 2023-06-06 NOTE — Discharge Instructions (Signed)
Your evaluated in the ED today for a finger laceration.  Please see the packet on nonsutured laceration care provided for you. Keep site clean & dry. Gently use soap & water after first steri strips follow fall off. DO NOT USE alcohol, hydrogen peroxide etc, to clean skin. You may cover the incision with clean gauze & replace it after your daily shower for your comfort. Wear a rubber glove and do not submerge in large pools of water. If you have skin tapes (Steristrips) or skin glue (Dermabond) on your incision, leave them in place. They will fall off on their own like a scab in a few weeks.  You may trim any edges that curl up with clean scissors.

## 2023-06-06 NOTE — ED Notes (Signed)
See triage notes. Patient was trying to get toast out of the toaster when one of the wires cut her right pointer finger. Unsure of last tetanus shot.

## 2023-06-06 NOTE — ED Triage Notes (Signed)
Pt presents to ED with c/o of R pointer finger laceration, bleeding controlled at this time.

## 2023-07-17 ENCOUNTER — Encounter: Payer: Self-pay | Admitting: Student in an Organized Health Care Education/Training Program

## 2023-07-17 ENCOUNTER — Ambulatory Visit
Payer: 59 | Attending: Student in an Organized Health Care Education/Training Program | Admitting: Student in an Organized Health Care Education/Training Program

## 2023-07-17 VITALS — BP 137/83 | HR 76 | Temp 97.4°F | Resp 18 | Ht 63.0 in | Wt 219.0 lb

## 2023-07-17 DIAGNOSIS — Z981 Arthrodesis status: Secondary | ICD-10-CM | POA: Insufficient documentation

## 2023-07-17 DIAGNOSIS — M5416 Radiculopathy, lumbar region: Secondary | ICD-10-CM | POA: Diagnosis not present

## 2023-07-17 DIAGNOSIS — M961 Postlaminectomy syndrome, not elsewhere classified: Secondary | ICD-10-CM | POA: Insufficient documentation

## 2023-07-17 DIAGNOSIS — M48061 Spinal stenosis, lumbar region without neurogenic claudication: Secondary | ICD-10-CM | POA: Insufficient documentation

## 2023-07-17 DIAGNOSIS — M51362 Other intervertebral disc degeneration, lumbar region with discogenic back pain and lower extremity pain: Secondary | ICD-10-CM | POA: Diagnosis present

## 2023-07-17 DIAGNOSIS — G894 Chronic pain syndrome: Secondary | ICD-10-CM | POA: Insufficient documentation

## 2023-07-17 DIAGNOSIS — M48062 Spinal stenosis, lumbar region with neurogenic claudication: Secondary | ICD-10-CM | POA: Diagnosis present

## 2023-07-17 DIAGNOSIS — M51369 Other intervertebral disc degeneration, lumbar region without mention of lumbar back pain or lower extremity pain: Secondary | ICD-10-CM | POA: Diagnosis present

## 2023-07-17 DIAGNOSIS — G8929 Other chronic pain: Secondary | ICD-10-CM | POA: Insufficient documentation

## 2023-07-17 DIAGNOSIS — M5412 Radiculopathy, cervical region: Secondary | ICD-10-CM | POA: Insufficient documentation

## 2023-07-17 NOTE — Patient Instructions (Signed)
______________________________________________________________________    General Risks and Possible Complications  Patient Responsibilities: It is important that you read this as it is part of your informed consent. It is our duty to inform you of the risks and possible complications associated with treatments offered to you. It is your responsibility as a patient to read this and to ask questions about anything that is not clear or that you believe was not covered in this document.  Patient's Rights: You have the right to refuse treatment. You also have the right to change your mind, even after initially having agreed to have the treatment done. However, under this last option, if you wait until the last second to change your mind, you may be charged for the materials used up to that point.  Introduction: Medicine is not an Visual merchandiser. Everything in Medicine, including the lack of treatment(s), carries the potential for danger, harm, or loss (which is by definition: Risk). In Medicine, a complication is a secondary problem, condition, or disease that can aggravate an already existing one. All treatments carry the risk of possible complications. The fact that a side effects or complications occurs, does not imply that the treatment was conducted incorrectly. It must be clearly understood that these can happen even when everything is done following the highest safety standards.  No treatment: You can choose not to proceed with the proposed treatment alternative. The "PRO(s)" would include: avoiding the risk of complications associated with the therapy. The "CON(s)" would include: not getting any of the treatment benefits. These benefits fall under one of three categories: diagnostic; therapeutic; and/or palliative. Diagnostic benefits include: getting information which can ultimately lead to improvement of the disease or symptom(s). Therapeutic benefits are those associated with the successful  treatment of the disease. Finally, palliative benefits are those related to the decrease of the primary symptoms, without necessarily curing the condition (example: decreasing the pain from a flare-up of a chronic condition, such as incurable terminal cancer).  General Risks and Complications: These are associated to most interventional treatments. They can occur alone, or in combination. They fall under one of the following six (6) categories: no benefit or worsening of symptoms; bleeding; infection; nerve damage; allergic reactions; and/or death. No benefits or worsening of symptoms: In Medicine there are no guarantees, only probabilities. No healthcare provider can ever guarantee that a medical treatment will work, they can only state the probability that it may. Furthermore, there is always the possibility that the condition may worsen, either directly, or indirectly, as a consequence of the treatment. Bleeding: This is more common if the patient is taking a blood thinner, either prescription or over the counter (example: Goody Powders, Fish oil, Aspirin, Garlic, etc.), or if suffering a condition associated with impaired coagulation (example: Hemophilia, cirrhosis of the liver, low platelet counts, etc.). However, even if you do not have one on these, it can still happen. If you have any of these conditions, or take one of these drugs, make sure to notify your treating physician. Infection: This is more common in patients with a compromised immune system, either due to disease (example: diabetes, cancer, human immunodeficiency virus [HIV], etc.), or due to medications or treatments (example: therapies used to treat cancer and rheumatological diseases). However, even if you do not have one on these, it can still happen. If you have any of these conditions, or take one of these drugs, make sure to notify your treating physician. Nerve Damage: This is more common when the treatment is  an invasive one, but it  can also happen with the use of medications, such as those used in the treatment of cancer. The damage can occur to small secondary nerves, or to large primary ones, such as those in the spinal cord and brain. This damage may be temporary or permanent and it may lead to impairments that can range from temporary numbness to permanent paralysis and/or brain death. Allergic Reactions: Any time a substance or material comes in contact with our body, there is the possibility of an allergic reaction. These can range from a mild skin rash (contact dermatitis) to a severe systemic reaction (anaphylactic reaction), which can result in death. Death: In general, any medical intervention can result in death, most of the time due to an unforeseen complication. ______________________________________________________________________      ______________________________________________________________________    Preparing for your procedure  Appointments: If you think you may not be able to keep your appointment, call 24-48 hours in advance to cancel. We need time to make it available to others.  During your procedure appointment there will be: No Prescription Refills. No disability issues to discussed. No medication changes or discussions.  Instructions: Food intake: Avoid eating anything solid for at least 8 hours prior to your procedure. Clear liquid intake: You may take clear liquids such as water up to 2 hours prior to your procedure. (No carbonated drinks. No soda.) Transportation: Unless otherwise stated by your physician, bring a driver. (Driver cannot be a Market researcher, Pharmacist, community, or any other form of public transportation.) Morning Medicines: Except for blood thinners, take all of your other morning medications with a sip of water. Make sure to take your heart and blood pressure medicines. If your blood pressure's lower number is above 100, the case will be rescheduled. Blood thinners: Make sure to stop your blood  thinners as instructed.  If you take a blood thinner, but were not instructed to stop it, call our office [phone] and ask to talk to a nurse. Not stopping a blood thinner prior to certain procedures could lead to serious complications. Diabetics on insulin: Notify the staff so that you can be scheduled 1st case in the morning. If your diabetes requires high dose insulin, take only  of your normal insulin dose the morning of the procedure and notify the staff that you have done so. Preventing infections: Shower with an antibacterial soap the morning of your procedure.  Build-up your immune system: Take 1000 mg of Vitamin C with every meal (3 times a day) the day prior to your procedure. Antibiotics: Inform the nursing staff if you are taking any antibiotics or if you have any conditions that may require antibiotics prior to procedures. (Example: recent joint implants)   Pregnancy: If you are pregnant make sure to notify the nursing staff. Not doing so may result in injury to the fetus, including death.  Sickness: If you have a cold, fever, or any active infections, call and cancel or reschedule your procedure. Receiving steroids while having an infection may result in complications. Arrival: You must be in the facility at least 30 minutes prior to your scheduled procedure. Tardiness: Your scheduled time is also the cutoff time. If you do not arrive at least 15 minutes prior to your procedure, you will be rescheduled.  Children: Do not bring any children with you. Make arrangements to keep them home. Dress appropriately: There is always a possibility that your clothing may get soiled. Avoid long dresses. Valuables: Do not bring any jewelry  or valuables.  Reasons to call and reschedule or cancel your procedure: (Following these recommendations will minimize the risk of a serious complication.) Surgeries: Avoid having procedures within 2 weeks of any surgery. (Avoid for 2 weeks before or after any  surgery). Flu Shots: Avoid having procedures within 2 weeks of a flu shots or . (Avoid for 2 weeks before or after immunizations). Barium: Avoid having a procedure within 7-10 days after having had a radiological study involving the use of radiological contrast. (Myelograms, Barium swallow or enema study). Heart attacks: Avoid any elective procedures or surgeries for the initial 6 months after a "Myocardial Infarction" (Heart Attack). Blood thinners: It is imperative that you stop these medications before procedures. Let us know if you if you take any blood thinner.  Infection: Avoid procedures during or within two weeks of an infection (including chest colds or gastrointestinal problems). Symptoms associated with infections include: Localized redness, fever, chills, night sweats or profuse sweating, burning sensation when voiding, cough, congestion, stuffiness, runny nose, sore throat, diarrhea, nausea, vomiting, cold or Flu symptoms, recent or current infections. It is specially important if the infection is over the area that we intend to treat. Heart and lung problems: Symptoms that may suggest an active cardiopulmonary problem include: cough, chest pain, breathing difficulties or shortness of breath, dizziness, ankle swelling, uncontrolled high or unusually low blood pressure, and/or palpitations. If you are experiencing any of these symptoms, cancel your procedure and contact your primary care physician for an evaluation.  Remember:  Regular Business hours are:  Monday to Thursday 8:00 AM to 4:00 PM  Provider's Schedule: Delano Metz, MD:  Procedure days: Tuesday and Thursday 7:30 AM to 4:00 PM  Edward Jolly, MD:  Procedure days: Monday and Wednesday 7:30 AM to 4:00 PM Last  Updated: 05/29/2023 ______________________________________________________________________     Selective Nerve Root Block Patient Information  Description: Specific nerve roots exit the spinal canal and these  nerves can be compressed and inflamed by a bulging disc and bone spurs.  By injecting steroids on the nerve root, we can potentially decrease the inflammation surrounding these nerves, which often leads to decreased pain.  Also, by injecting local anesthesia on the nerve root, this can provide [place] helpful information to give to your referring doctor if it decreases your pain.  Selective nerve root blocks can be done along the spine from the neck to the low back depending on the location of your pain.   After numbing the skin with local anesthesia, a small needle is passed to the nerve root and the position of the needle is verified using x-ray pictures.  After the needle is in correct position, we then deposit the medication.  You may experience a pressure sensation while this is being done.  The entire block usually lasts less than 15 minutes.  Conditions that may be treated with selective nerve root blocks: Low back and leg pain Spinal stenosis Diagnostic block prior to potential surgery Neck and arm pain Post laminectomy syndrome  Preparation for the injection:  Do not eat any solid food or dairy products within 8 hours of your appointment. You may drink clear liquids up to 3 hours before an appointment.  Clear liquids include water, black coffee, juice or soda.  No milk or cream please. You may take your regular medications, including pain medications, with a sip of water before your appointment.  Diabetics should hold regular insulin (if taken separately) and take 1/2 normal NPH dose the morning of the  procedure.  Carry some sugar containing items with you to your appointment. A driver must accompany you and be prepared to drive you home after your procedure. Bring all your current medications with you. An IV may be inserted and sedation may be given at the discretion of the physician. A blood pressure cuff, EKG, and other monitors will often be applied during the procedure.  Some patients may  need to have extra oxygen administered for a short period. You will be asked to provide medical information, including allergies, prior to the procedure.  We must know immediately if you are taking blood  Thinners (like Coumadin) or if you are allergic to IV iodine contrast (dye).  Possible side-effects: All are usually temporary Bleeding from needle site Light headedness Numbness and tingling Decreased blood pressure Weakness in arms/legs Pressure sensation in back/neck Pain at injection site (several days)  Possible complications: All are extremely rare Infection Nerve injury Spinal headache (a headache wore with upright position)  Call if you experience: Fever/chills associated with headache or increased back/neck pain Headache worsened by an upright position New onset weakness or numbness of an extremity below the injection site Hives or difficulty breathing (go to the emergency room) Inflammation or drainage at the injection site(s) Severe back/neck pain greater than usual New symptoms which are concerning to you  Please note:  Although the local anesthetic injected can often make your back or neck feel good for several hours after the injection the pain will likely return.  It takes 3-5 days for steroids to work on the nerve root. You may not notice any pain relief for at least one week.  If effective, we will often do a series of 3 injections spaced 3-6 weeks apart to maximally decrease your pain.    If you have any questions, please call [phone] [facility]

## 2023-07-17 NOTE — Progress Notes (Signed)
Patient: Candice Maldonado  Service Category: E/M  Provider: Edward Jolly, MD  DOB: Jan 04, 1956  DOS: 07/17/2023  Referring Provider: Merryl Hacker, NP  MRN: [id number]  Setting: Ambulatory outpatient  PCP: Dalbert Mayotte III, PA-C  Type: New Patient  Specialty: Interventional Pain Management    Location: Office  Delivery: Face-to-face     Primary Reason(s) for Visit: Encounter for initial evaluation of one or more chronic problems (new to examiner) potentially causing chronic pain, and posing a threat to normal musculoskeletal function. (Level of risk: High) CC: Back Pain, Shoulder Pain, Leg Pain, and Hip Pain  HPI  Candice Maldonado is a 67 y.o. year old, female patient, who comes for the first time to our practice referred by Merryl Hacker, NP for our initial evaluation of her chronic pain. 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 Today she comes in for evaluation of her Back Pain, Shoulder Pain, Leg Pain, and Hip Pain  Pain Assessment: Location: Lower Back Radiating: radiates down both legs in the back to feet Onset: More than a month ago Duration: Chronic pain Quality: Throbbing Severity: 8 /10 (subjective, self-reported pain score)  Effect on ADL: Limits activities Timing: Constant Modifying factors: depends BP: 137/83  HR: 76  Onset and Duration: Gradual and Present longer than 3 months Cause of pain: Unknown Severity: Getting worse, NAS-11 at its  worse: 10/10, NAS-11 at its best: 6/10, NAS-11 now: 9/10, and NAS-11 on the average: 8/10 Timing: Not influenced by the time of the day Aggravating Factors: Lifiting, Prolonged sitting, Twisting, Walking, and Walking uphill Alleviating Factors:  depends Associated Problems: Night-time cramps, Depression, Numbness, Spasms, Swelling, Tingling, and Weakness Quality of Pain: Aching, Numb, and Uncomfortable Previous Examinations or Tests: Orthopedic evaluation and Psychiatric evaluation Previous Treatments: Epidural steroid injections, Narcotic medications, and Radiofrequency  Candice Maldonado is being evaluated for possible interventional pain management therapies for the treatment of her chronic pain.   Patient is a pleasant 67 year old female who presents with a chief complaint of low back pain which radiates into bilateral legs.  She states that her left leg is worse than her right leg.  She also endorses burning and tingling in her left leg along with edema.  She has a history of 2 prior lumbar spine surgeries the first of which was a lumbar laminectomy followed by a lumbar fusion in 2011.  She continues to have persistent low back and bilateral leg pain.  She has difficulty ambulating and ambulates with a walker.  She was previously seen at [facility] however her doctor there is retired.  She has also moved to [facility] and hopes to have her pain management care transferred here.  She is on hydrocodone 10 mg every 8 hours along with methocarbamol 500 mg 3 times daily as needed and Lyrica 200 mg twice daily.  She has had lumbar spinal injections in the past with limited response.  She has also had cervical epidural steroid injections with limited response.  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  Today we discussed a diagnostic lumbar transforaminal  epidural steroid injection at L1-L2 for her adjacent segment disease and her chronic lumbar radicular pain.  Patient has done physical therapy in the past and tries to do stretching exercises when she can.  We also briefly discussed spinal cord stimulation and she may be a candidate for this given her history of lumbar spine fusion.  Historic Controlled Substance Pharmacotherapy Review  PMP and historical list of controlled substances: Hydrocodone 10 mg 3 times daily as needed The patient  reports no history of drug use. List of prior UDS Testing: No results found for: "MDMA", "COCAINSCRNUR", "PCPSCRNUR", "PCPQUANT", "CANNABQUANT", "THCU", "ETH", "CBDTHCR", "D8THCCBX", "D9THCCBX" Historical Background Evaluation: [place] PMP: PDMP not reviewed this encounter. Review of the past 65-months conducted.             [place] Department of public safety, offender search: Engineer, mining Information) Non-contributory Risk Assessment Profile: Aberrant behavior: None observed or detected today Risk factors for fatal opioid overdose: None identified today Fatal overdose hazard ratio (HR): Calculation deferred Non-fatal overdose hazard ratio (HR): Calculation deferred Risk of opioid abuse or dependence: 0.7-3.0% with doses <= 36 MME/day and 6.1-26% with doses >= 120 MME/day. Substance use disorder (SUD) risk level: See below Personal History of Substance Abuse (SUD-Substance use disorder):  Alcohol: Negative  Illegal Drugs: Negative  Rx Drugs: Negative  ORT Risk Level calculation: Low Risk  Opioid Risk Tool - 07/17/23 1351       Family History of Substance Abuse   Alcohol Negative    Illegal Drugs Negative    Rx Drugs Negative      Personal History of Substance Abuse   Alcohol Negative    Illegal Drugs Negative    Rx Drugs Negative      Age   Age between 16-45 years  No      History of Preadolescent Sexual Abuse   History of Preadolescent Sexual Abuse Negative or Female      Psychological Disease    Psychological Disease Negative    Depression Positive      Total Score   Opioid Risk Tool Scoring 1    Opioid Risk Interpretation Low Risk            ORT Scoring interpretation table:  Score <3 = Low Risk for SUD  Score between 4-7 = Moderate Risk for SUD  Score >8 = High Risk for Opioid Abuse   PHQ-2 Depression Scale:  Total score: 0  PHQ-2 Scoring interpretation table: (Score and probability of major depressive disorder)  Score 0 = No depression  Score 1 = 15.4% Probability  Score 2 = 21.1% Probability  Score 3 = 38.4% Probability  Score 4 = 45.5% Probability  Score 5 = 56.4% Probability  Score 6 = 78.6% Probability   PHQ-9 Depression Scale:  Total score: 0  PHQ-9 Scoring interpretation table:  Score 0-4 = No depression  Score 5-9 = Mild depression  Score 10-14 = Moderate depression  Score 15-19 = Moderately severe depression  Score 20-27 = Severe depression (2.4 times higher risk of SUD and 2.89 times higher risk of overuse)   Pharmacologic Plan: As per protocol, I have not taken over any controlled substance management, pending the results of ordered tests and/or consults.            Initial impression: Pending review of available data and ordered tests.  Meds  Current Outpatient Medications:    ascorbic acid (VITAMIN C) 500 MG tablet, Take 500 mg by mouth daily., Disp: , Rfl:    aspirin EC 81 MG tablet, Take 81 mg by mouth daily. Swallow whole., Disp: , Rfl:    atorvastatin (LIPITOR) 10 MG tablet, Take 10 mg by mouth daily., Disp: , Rfl:    Black Cohosh 40 MG CAPS, Take 40 mg by mouth daily., Disp: , Rfl:    cholecalciferol (VITAMIN D3) 25 MCG (1000 UNIT) tablet, Take 1,000 Units by mouth daily., Disp: , Rfl:    ELDERBERRY PO, Take 1 capsule by mouth daily., Disp: , Rfl:    escitalopram (LEXAPRO) 10 MG tablet, Take 10 mg by mouth daily., Disp: , Rfl:    famotidine (PEPCID) 20 MG tablet, Take 1 tablet (20 mg total) by mouth 2 (two) times daily., Disp: 30 tablet,  Rfl: 0   hydrochlorothiazide (HYDRODIURIL) 25 MG tablet, Take 25 mg by mouth daily as needed (if is over 130)., Disp: , Rfl:    HYDROcodone-acetaminophen (NORCO) 10-325 MG tablet, Take 1 tablet by mouth 3 (three) times daily as needed for severe pain., Disp: , Rfl:    methocarbamol (ROBAXIN) 500 MG tablet, Take 500 mg by mouth 3 (three) times daily., Disp: , Rfl:    Multiple Vitamin (MULTIVITAMIN) tablet, Take 1 tablet by mouth daily., Disp: , Rfl:    naloxone (NARCAN) nasal spray 4 mg/0.1 mL, Place 1 spray into the nose once as needed (overdose)., Disp: , Rfl:    omeprazole (PRILOSEC) 20 MG capsule, Take 20 mg by mouth daily., Disp: , Rfl:    pregabalin (LYRICA) 200 MG capsule, Take 200 mg by mouth 2 (two) times daily., Disp: , Rfl:    pyridOXINE (VITAMIN B-6) 100 MG tablet, Take 100 mg by mouth daily., Disp: , Rfl:    vitamin B-12 (CYANOCOBALAMIN) 1000 MCG tablet, Take 1,000 mcg by mouth daily., Disp: , Rfl:   Imaging Review  Cervical Imaging: Cervical MR wo contrast: Results for orders placed during the hospital encounter of 09/08/22  MR CERVICAL SPINE WO CONTRAST  Narrative CLINICAL DATA:  Spinal stenosis, left cervical radiculopathy  EXAM: MRI CERVICAL SPINE WITHOUT CONTRAST  TECHNIQUE: Multiplanar, multisequence MR imaging of the cervical spine was performed. No intravenous contrast was administered.  COMPARISON:  None Available.  FINDINGS: Alignment: Minimal grade 1 anterolisthesis of C7 on T1 and T1 on T2.  Vertebrae: No acute fracture, evidence of discitis, or aggressive bone lesion.  Cord: Normal signal and morphology.  Posterior Fossa, vertebral arteries, paraspinal tissues: Posterior fossa demonstrates no focal abnormality. Vertebral artery flow voids are maintained. Paraspinal soft tissues are unremarkable.  Disc levels:  Discs: Degenerative disease with disc height loss at C3-4, C4-5, C5-6, C6-7 and C7-T1.  C2-3: Mild broad-based disc bulge. Moderate  bilateral foraminal stenosis. No spinal stenosis.  C3-4: Broad-based disc bulge with a broad central disc protrusion contacting the ventral cervical spinal cord. Bilateral uncovertebral degenerative changes. Severe bilateral foraminal stenosis. Mild bilateral facet arthropathy. Mild spinal stenosis.  C4-5: Broad-based disc bulge with a central disc protrusion mildly deforming the ventral cervical spinal cord. Bilateral uncovertebral degenerative changes. Severe bilateral foraminal stenosis. Moderate spinal stenosis.  C5-6: Broad-based disc bulge with a broad central disc protrusion mildly deforming the ventral cervical spinal cord. Bilateral uncovertebral degenerative changes. Severe bilateral foraminal stenosis, left greater than right. Moderate spinal stenosis.  C6-7: Broad-based disc bulge. Bilateral uncovertebral degenerative changes. Moderate bilateral foraminal stenosis. Bilateral perineural cyst. Mild spinal stenosis.  C7-T1:  Mild broad-based disc bulge. Mild bilateral foraminal stenosis. No spinal stenosis.  T1-2: Mild broad-based disc bulge. Moderate-severe left foraminal stenosis. No right foraminal stenosis. No spinal stenosis.  IMPRESSION: 1. At C3-4 there is a broad-based disc bulge with a broad central disc protrusion contacting the ventral cervical spinal cord. Bilateral uncovertebral degenerative changes. Severe bilateral foraminal stenosis. Mild bilateral facet arthropathy. Mild spinal stenosis. 2. At C4-5 there is a broad-based disc bulge with a central disc protrusion mildly deforming the ventral cervical spinal cord. Bilateral uncovertebral degenerative changes. Severe bilateral foraminal stenosis. Moderate spinal stenosis. 3. At C5-6 there is a broad-based disc bulge with a broad central disc protrusion mildly deforming the ventral cervical spinal cord. Bilateral uncovertebral degenerative changes. Severe bilateral foraminal stenosis, left greater than  right. Moderate spinal stenosis. 4. At C6-7 there is a broad-based disc bulge. Bilateral uncovertebral degenerative changes. Moderate bilateral foraminal stenosis. Mild spinal stenosis. 5. No acute osseous injury of the cervical spine.   Electronically Signed By: Elige Ko M.D. On: 09/09/2022 09:52    Narrative CLINICAL DATA:  Blunt poly trauma.  EXAM: CT HEAD WITHOUT CONTRAST  CT CERVICAL SPINE WITHOUT CONTRAST  TECHNIQUE: Multidetector CT imaging of the head and cervical spine was performed following the standard protocol without intravenous contrast. Multiplanar CT image reconstructions of the cervical spine were also generated.  RADIATION DOSE REDUCTION: This exam was performed according to the departmental dose-optimization program which includes automated exposure control, adjustment of the mA and/or kV according to patient size and/or use of iterative reconstruction technique.  COMPARISON:  None Available.  FINDINGS: CT HEAD FINDINGS  Brain: No evidence of acute infarction, hemorrhage, hydrocephalus, extra-axial collection or mass lesion/mass effect.  Vascular: No hyperdense vessel or unexpected calcification.  Skull: Normal. Negative for fracture or focal lesion.  Sinuses/Orbits: No acute finding.  Other: None.  CT CERVICAL SPINE FINDINGS  Alignment: Straightening of the cervical spine.  Skull base and vertebrae: No acute fracture. No primary bone lesion or focal pathologic process.  Soft tissues and spinal canal: No prevertebral fluid or swelling. No visible canal hematoma.  Disc levels:  C2-C3:  No significant findings.  C3-C4: Disc osteophyte complex with mild spinal canal stenosis. Mild right facet joint arthropathy. Uncovertebral joint arthropathy with mild bilateral neural foraminal stenosis.  C4-C5: Disc height loss and disc osteophyte complex with mild spinal canal stenosis. Mild bilateral facet joint arthropathy. Uncovertebral  joint arthropathy with mild bilateral neural foraminal stenosis.  C5-C6: Disc osteophyte complex and uncovertebral joint arthropathy. Mild spinal canal and mild bilateral neural foraminal stenosis.  C6-C7: Disc height loss and uncovertebral joint arthropathy. Mild bilateral neural foraminal stenosis.  C7-T1: Disc height loss and facet joint arthropathy. Significant neural foraminal stenosis.  Upper chest: Negative.  Other: None  IMPRESSION: CT HEAD:  No acute intracranial abnormality.  CT CERVICAL SPINE:  1. No acute fracture or traumatic subluxation. 2. Multilevel degenerative disc and joint disease.   Electronically Signed By: Larose Hires D.O. On: 09/25/2022 20:53   Narrative CLINICAL DATA:  Left arm radiculopathy.  EXAM: CERVICAL SPINE - 2-3 VIEW  COMPARISON:  None Available.  FINDINGS: The cervical spine is imaged through the top half of the C6 vertebral body in the lateral projection. The lower cervical spine is suboptimally assessed due to overlying structures. The imaged vertebral body heights are preserved. There is straightening of the cervical spine curvature without antero or retrolisthesis. There is multilevel disc space narrowing with prominent degenerative endplate change and anterior osteophytes at C3 through  C6.  The prevertebral soft tissues are unremarkable. The imaged lung apices are clear.  IMPRESSION: Disc degeneration with degenerative endplate change and prominent anterior osteophytes at C3 through C6.   Electronically Signed By: Lesia Hausen M.D. On: 08/15/2022 13:18   DG Shoulder Left  Narrative CLINICAL DATA:  Fall, altered mental status.  Left pain  EXAM: LEFT HUMERUS - 2+ VIEW; LEFT SHOULDER - 2+ VIEW  COMPARISON:  None Available.  FINDINGS: Left shoulder: There is no evidence of fracture or other focal bone lesions. Mild glenohumeral and acromioclavicular osteoarthritis. Multiple surgical clips projecting over  the left axilla.  Left humerus: No acute fracture or dislocation. Soft tissues are unremarkable.  IMPRESSION: 1. No acute fracture or dislocation of the left shoulder and left humerus. 2. Mild glenohumeral and acromioclavicular osteoarthritis.   Electronically Signed By: Larose Hires D.O. On: 09/25/2022 20:44   MR LUMBAR SPINE WO CONTRAST  Narrative CLINICAL DATA:  Low back pain radiating into the left leg for 3 months. No known injury. History of breast cancer and lumbar fusion in 2013.  EXAM: MRI LUMBAR SPINE WITHOUT CONTRAST  TECHNIQUE: Multiplanar, multisequence MR imaging of the lumbar spine was performed. No intravenous contrast was administered.  COMPARISON:  Lumbar MRI 05/12/2020.  Abdominopelvic CT 06/30/2021.  FINDINGS: Segmentation: Conventional anatomy assumed, with the last open disc space designated L5-S1.Concordant with previous imaging.  Alignment: Stable grade 1 anterolisthesis at L2-3 and grade 1 retrolisthesis at L5-S1.  Vertebrae: No worrisome osseous lesion, acute fracture or pars defect. There is a stable hemangioma within the L2 vertebral body. Multilevel postsurgical changes are stable status post L3-5 posterior decompression and interbody fusion. The visualized sacroiliac joints appear unremarkable.  Conus medullaris: Extends to the L2 level and appears normal.  Paraspinal and other soft tissues: No significant paraspinal findings.  Disc levels:  T12-L1: Stable mild disc bulging. There is a small extruded disc fragment extending superiorly in the left T12 lateral recess which could contribute to left T12 nerve root encroachment. Mild bilateral facet hypertrophy.  L1-2: Loss of disc height with annular disc bulging and endplate osteophytes asymmetric to the right. Mild facet and ligamentous hypertrophy. Stable asymmetric right foraminal narrowing.  L2-3: Adjacent segment disease with loss of disc height, annular disc bulging and  endplate osteophytes asymmetric to the right. Moderate facet and ligamentous hypertrophy. Resulting severe multifactorial spinal stenosis and chronic effacement of the lateral recesses appears unchanged. There is moderate right and mild left foraminal narrowing.  L3-4: Status post fusion and posterior decompression. The spinal canal and neural foramina are patent.  L4-5: Status post fusion and posterior decompression. The spinal canal and neural foramina are patent.  L5-S1: Remote posterior decompression. Chronic degenerative disc disease with loss of disc height, annular disc bulging and endplate osteophytes. Chronic narrowing of the lateral recesses and foramina appears unchanged.  IMPRESSION: 1. New small left-sided disc extrusion at T12-L1 with cephalad extension and possible left T12 nerve root encroachment. 2. Stable asymmetric right foraminal narrowing at L1-2. 3. Adjacent segment disease at L2-3 with resulting severe multifactorial spinal stenosis, moderate right and mild left foraminal narrowing, similar to previous study. 4. Stable postsurgical changes at the lower 3 levels with chronic lateral recess and foraminal narrowing bilaterally at L5-S1.   Electronically Signed By: Carey Bullocks M.D. On: 09/26/2021 13:10  MR Lumbar Spine W Wo Contrast  Narrative CLINICAL DATA:  Low back pain radiating bilaterally  EXAM: MRI LUMBAR SPINE WITHOUT AND WITH CONTRAST  TECHNIQUE: Multiplanar and multiecho pulse  sequences of the lumbar spine were obtained without and with intravenous contrast.  CONTRAST:  20mL MULTIHANCE GADOBENATE DIMEGLUMINE 529 MG/ML IV SOLN  COMPARISON:  None.  FINDINGS: Motion artifact is present.  Segmentation: Standard.  Alignment: Trace retrolisthesis at T12-L1. Trace anterolisthesis at L2-L3.  Vertebrae: Postoperative changes are present at L3-L5 including rods and pedicle screws, interbody spacers (at L3-L4 and L4-L5), and posterior  decompression. The hardware is not well evaluated on this study and there is associated susceptibility artifact.  Conus medullaris and cauda equina: Conus extends to the L2 level. Conus and cauda equina appear normal. No abnormal intrathecal enhancement.  Paraspinal and other soft tissues: Unremarkable.  Disc levels:  T12-L1: Disc bulge and mild facet arthropathy. No significant canal or foraminal stenosis.  L1-L2: Disc bulge and facet arthropathy with ligamentum flavum infolding. No significant canal stenosis. Mild foraminal stenosis.  L2-L3: Disc bulge with endplate osteophytic ridging and facet arthropathy with ligamentum flavum infolding. Marked canal stenosis with effacement of the lateral recesses moderate right and mild left foraminal stenosis.  L3-L4: Operative level. Endplate osteophytes and facet arthropathy. The canal is decompressed. Foramina are distorted by artifact without apparent stenosis.  L4-L5: Operative level. Endplate osteophytes and facet arthropathy. Canal is decompressed. Foramina are distorted by artifact with probable mild narrowing due to osteophytes.  L5-S1: Operative level. Disc bulge with endplate osteophytic ridging and facet arthropathy with residual ligamentum flavum infolding. No significant canal stenosis. Narrowing of the left greater than right lateral recesses. Foramina are distorted by artifact with probable at least moderate stenosis.  IMPRESSION: Postoperative and degenerative changes as detailed above. Stenosis is present at L2-L3 and L5-S1.   Electronically Signed By: Guadlupe Spanish M.D. On: 05/12/2020 16:02   Narrative CLINICAL DATA:  Chronic, progressively worsening low back pain radiating down the back of both legs to the calf. History of prior fusion.  EXAM: LUMBAR MYELOGRAM  CT LUMBAR MYELOGRAM  FLUOROSCOPY TIME:  Radiation Exposure Index (as provided by the fluoroscopic device): 13.5 mGy  Kerma  PROCEDURE: After thorough discussion of risks and benefits of the procedure including bleeding, infection, injury to nerves, blood vessels, adjacent structures as well as headache and CSF leak, written and oral informed consent was obtained. Consent was obtained by Dr. Malachi Pro. Time out form was completed.  Patient was positioned prone on the fluoroscopy table. Local anesthesia was provided with 1% lidocaine without epinephrine after prepped and draped in the usual sterile fashion. Puncture was performed at L4 using a 5 inch 22-gauge spinal needle via midline approach. Using a single pass through the dura, the needle was placed within the thecal sac, with return of clear CSF. 15 mL of Isovue M-200 was injected into the thecal sac, with normal opacification of the nerve roots and cauda equina consistent with free flow within the subarachnoid space.  I personally performed the lumbar puncture and administered the intrathecal contrast. I also personally supervised acquisition of the myelogram images.  TECHNIQUE: Contiguous axial images were obtained through the lumbar spine after the intrathecal infusion of contrast. Coronal and sagittal reconstructions were obtained of the axial image sets.  COMPARISON:  MRI lumbar spine dated September 25, 2021.  FINDINGS: LUMBAR MYELOGRAM FINDINGS:  Prior L3-L5 PLIF. Mild levoscoliosis. No significant lateral translation. 6 mm anterolisthesis at L2-L3 when standing is unchanged in flexion and improves with extension. 4 mm retrolisthesis at L5-S1 improves with flexion and is unchanged in extension.  Small ventral extradural defects from T12-L1 through L2-L3 and at L5-S1. Severe spinal  canal stenosis at L2-L3 with clumping of the proximal cauda equina nerve roots. Underfilling of the bilateral L5 and S1 nerve roots.  CT LUMBAR MYELOGRAM FINDINGS:  Segmentation: Standard.  Alignment: Mild levoscoliosis. Trace anterolisthesis  at L1-L2. 6 mm anterolisthesis at L2-L3. 4 mm retrolisthesis at L5-S1. Findings are unchanged from prior.  Vertebrae: Prior L3-L5 PLIF. Solid interbody and posterior fusion at both levels. The right L3 pedicle screw approaches and slightly violates the superior endplate (series 7, image 29). No evidence of hardware loosening. No acute fracture or other focal pathologic process. Unchanged L1 and L2 vertebral body hemangiomas.  Conus medullaris and cauda equina: Conus extends to the L2 level. Clumping of the proximal cauda equina nerve roots.  Paraspinal and other soft tissues: Aortoiliac atherosclerotic vascular disease. Bilateral sacroiliac joint osteoarthritis.  Disc levels:  T11-T12: Unchanged mild disc bulging with moderate left and mild right facet arthropathy. No stenosis.  T12-L1: Mild disc bulging with interval increase in size of superimposed left paracentral disc extrusion migrating superiorly. Unchanged mild-to-moderate bilateral facet arthropathy. Unchanged mild left lateral recess stenosis. No spinal canal or neuroforaminal stenosis.  L1-L2: Unchanged mild-to-moderate disc bulging and endplate spurring asymmetric to the right. Unchanged moderate right mild-to-moderate left facet arthropathy. Unchanged mild spinal canal and right greater than left lateral recess stenosis. Unchanged moderate right and mild left neuroforaminal stenosis.  L2-L3: Unchanged moderate disc bulging and endplate spurring asymmetric to the right with severe bilateral facet arthropathy. Unchanged severe spinal canal stenosis with moderate right and mild left neuroforaminal stenosis.  L3-L4: Prior posterior decompression and PLIF. No residual stenosis.  L4-L5: Prior posterior decompression and PLIF. No residual stenosis.  L5-S1: Prior posterior decompression. Unchanged bulky circumferential disc osteophyte complex with severe left and moderate right facet arthropathy. Unchanged moderate  bilateral lateral recess stenosis and severe bilateral neuroforaminal stenosis. No spinal canal stenosis.  IMPRESSION: 1. Prior L3-L5 PLIF with solid fusion at both levels. The right L3 pedicle screw approaches and slightly violates the superior endplate. Otherwise, no evidence of hardware complication. 2. Unchanged adjacent segment severe spinal canal stenosis at L2-L3. 3. Unchanged adjacent segment moderate bilateral lateral recess and severe bilateral neuroforaminal stenosis at L5-S1. 4. Interval increase in size of superimposed left paracentral disc extrusion at T12-L1 without high-grade stenosis or impingement. 5. Mild dynamic instability at L2-L3 and L5-S1. 6. Aortic Atherosclerosis (ICD10-I70.0).   Electronically Signed By: Obie Dredge M.D. On: 02/14/2022 12:14   Narrative CLINICAL DATA:  Chronic, progressively worsening low back pain radiating down the back of both legs to the calf. History of prior fusion.  EXAM: LUMBAR MYELOGRAM  CT LUMBAR MYELOGRAM  FLUOROSCOPY TIME:  Radiation Exposure Index (as provided by the fluoroscopic device): 13.5 mGy Kerma  PROCEDURE: After thorough discussion of risks and benefits of the procedure including bleeding, infection, injury to nerves, blood vessels, adjacent structures as well as headache and CSF leak, written and oral informed consent was obtained. Consent was obtained by Dr. Malachi Pro. Time out form was completed.  Patient was positioned prone on the fluoroscopy table. Local anesthesia was provided with 1% lidocaine without epinephrine after prepped and draped in the usual sterile fashion. Puncture was performed at L4 using a 5 inch 22-gauge spinal needle via midline approach. Using a single pass through the dura, the needle was placed within the thecal sac, with return of clear CSF. 15 mL of Isovue M-200 was injected into the thecal sac, with normal opacification of the nerve roots and cauda equina consistent  with free flow  within the subarachnoid space.  I personally performed the lumbar puncture and administered the intrathecal contrast. I also personally supervised acquisition of the myelogram images.  TECHNIQUE: Contiguous axial images were obtained through the lumbar spine after the intrathecal infusion of contrast. Coronal and sagittal reconstructions were obtained of the axial image sets.  COMPARISON:  MRI lumbar spine dated September 25, 2021.  FINDINGS: LUMBAR MYELOGRAM FINDINGS:  Prior L3-L5 PLIF. Mild levoscoliosis. No significant lateral translation. 6 mm anterolisthesis at L2-L3 when standing is unchanged in flexion and improves with extension. 4 mm retrolisthesis at L5-S1 improves with flexion and is unchanged in extension.  Small ventral extradural defects from T12-L1 through L2-L3 and at L5-S1. Severe spinal canal stenosis at L2-L3 with clumping of the proximal cauda equina nerve roots. Underfilling of the bilateral L5 and S1 nerve roots.  CT LUMBAR MYELOGRAM FINDINGS:  Segmentation: Standard.  Alignment: Mild levoscoliosis. Trace anterolisthesis at L1-L2. 6 mm anterolisthesis at L2-L3. 4 mm retrolisthesis at L5-S1. Findings are unchanged from prior.  Vertebrae: Prior L3-L5 PLIF. Solid interbody and posterior fusion at both levels. The right L3 pedicle screw approaches and slightly violates the superior endplate (series 7, image 29). No evidence of hardware loosening. No acute fracture or other focal pathologic process. Unchanged L1 and L2 vertebral body hemangiomas.  Conus medullaris and cauda equina: Conus extends to the L2 level. Clumping of the proximal cauda equina nerve roots.  Paraspinal and other soft tissues: Aortoiliac atherosclerotic vascular disease. Bilateral sacroiliac joint osteoarthritis.  Disc levels:  T11-T12: Unchanged mild disc bulging with moderate left and mild right facet arthropathy. No stenosis.  T12-L1: Mild disc bulging with  interval increase in size of superimposed left paracentral disc extrusion migrating superiorly. Unchanged mild-to-moderate bilateral facet arthropathy. Unchanged mild left lateral recess stenosis. No spinal canal or neuroforaminal stenosis.  L1-L2: Unchanged mild-to-moderate disc bulging and endplate spurring asymmetric to the right. Unchanged moderate right mild-to-moderate left facet arthropathy. Unchanged mild spinal canal and right greater than left lateral recess stenosis. Unchanged moderate right and mild left neuroforaminal stenosis.  L2-L3: Unchanged moderate disc bulging and endplate spurring asymmetric to the right with severe bilateral facet arthropathy. Unchanged severe spinal canal stenosis with moderate right and mild left neuroforaminal stenosis.  L3-L4: Prior posterior decompression and PLIF. No residual stenosis.  L4-L5: Prior posterior decompression and PLIF. No residual stenosis.  L5-S1: Prior posterior decompression. Unchanged bulky circumferential disc osteophyte complex with severe left and moderate right facet arthropathy. Unchanged moderate bilateral lateral recess stenosis and severe bilateral neuroforaminal stenosis. No spinal canal stenosis.  IMPRESSION: 1. Prior L3-L5 PLIF with solid fusion at both levels. The right L3 pedicle screw approaches and slightly violates the superior endplate. Otherwise, no evidence of hardware complication. 2. Unchanged adjacent segment severe spinal canal stenosis at L2-L3. 3. Unchanged adjacent segment moderate bilateral lateral recess and severe bilateral neuroforaminal stenosis at L5-S1. 4. Interval increase in size of superimposed left paracentral disc extrusion at T12-L1 without high-grade stenosis or impingement. 5. Mild dynamic instability at L2-L3 and L5-S1. 6. Aortic Atherosclerosis (ICD10-I70.0).   Electronically Signed By: Obie Dredge M.D. On: 02/14/2022 12:14  CLINICAL DATA:  Fall, left hip  pain  EXAM: DG HIP (WITH OR WITHOUT PELVIS) 2-3V LEFT  COMPARISON:  None Available.  FINDINGS: There is no evidence of hip fracture or dislocation. There is no evidence of arthropathy or other focal bone abnormality.  IMPRESSION: Negative.   Electronically Signed By: Larose Hires D.O. On: 09/25/2022 20:45  Complexity Note: Imaging results reviewed.  ROS  Cardiovascular: No reported cardiovascular signs or symptoms such as High blood pressure, coronary artery disease, abnormal heart rate or rhythm, heart attack, blood thinner therapy or heart weakness and/or failure Pulmonary or Respiratory: No reported pulmonary signs or symptoms such as wheezing and difficulty taking a deep full breath (Asthma), difficulty blowing air out (Emphysema), coughing up mucus (Bronchitis), persistent dry cough, or temporary stoppage of breathing during sleep Neurological: Incontinence:  Urinary Psychological-Psychiatric: Depressed Gastrointestinal: Reflux or heatburn Genitourinary: No reported renal or genitourinary signs or symptoms such as difficulty voiding or producing urine, peeing blood, non-functioning kidney, kidney stones, difficulty emptying the bladder, difficulty controlling the flow of urine, or chronic kidney disease Hematological: No reported hematological signs or symptoms such as prolonged bleeding, low or poor functioning platelets, bruising or bleeding easily, hereditary bleeding problems, low energy levels due to low hemoglobin or being anemic Endocrine: No reported endocrine signs or symptoms such as high or low blood sugar, rapid heart rate due to high thyroid levels, obesity or weight gain due to slow thyroid or thyroid disease Rheumatologic: No reported rheumatological signs and symptoms such as fatigue, joint pain, tenderness, swelling, redness, heat, stiffness, decreased range of motion, with or without associated rash Musculoskeletal: Negative for myasthenia  gravis, muscular dystrophy, multiple sclerosis or malignant hyperthermia Work History: Retired  Allergies  Candice Maldonado is allergic to tape.  Laboratory Chemistry Profile   Renal Lab Results  Component Value Date   BUN 28 (H) 09/25/2022   CREATININE 1.38 (H) 09/25/2022   GFRNONAA 42 (L) 09/25/2022   PROTEINUR 30 (A) 09/25/2022     Electrolytes Lab Results  Component Value Date   NA 138 09/25/2022   K 3.6 09/25/2022   CL 103 09/25/2022   CALCIUM 9.2 09/25/2022     Hepatic Lab Results  Component Value Date   AST 17 09/25/2022   ALT 9 09/25/2022   ALBUMIN 3.7 09/25/2022   ALKPHOS 55 09/25/2022   LIPASE 24 08/03/2021     ID Lab Results  Component Value Date   SARSCOV2NAA NEGATIVE 11/22/2020     Bone No results found for: "VD25OH", "VD125OH2TOT", "[id number]", "NW2956OZ3", "25OHVITD1", "25OHVITD2", "25OHVITD3", "TESTOFREE", "TESTOSTERONE"   Endocrine Lab Results  Component Value Date   GLUCOSE 106 (H) 09/25/2022   GLUCOSEU NEGATIVE 09/25/2022     Neuropathy No results found for: "VITAMINB12", "FOLATE", "HGBA1C", "HIV"   CNS No results found for: "COLORCSF", "APPEARCSF", "RBCCOUNTCSF", "WBCCSF", "POLYSCSF", "LYMPHSCSF", "EOSCSF", "PROTEINCSF", "GLUCCSF", "JCVIRUS", "CSFOLI", "IGGCSF", "LABACHR", "ACETBL"   Inflammation (CRP: Acute  ESR: Chronic) No results found for: "CRP", "ESRSEDRATE", "LATICACIDVEN"   Rheumatology No results found for: "RF", "ANA", "LABURIC", "URICUR", "LYMEIGGIGMAB", "LYMEABIGMQN", "HLAB27"   Coagulation Lab Results  Component Value Date   PLT 234 09/25/2022     Cardiovascular Lab Results  Component Value Date   CKTOTAL 93 09/25/2022   HGB 11.2 (L) 09/25/2022   HCT 36.2 09/25/2022     Screening Lab Results  Component Value Date   SARSCOV2NAA NEGATIVE 11/22/2020     Cancer No results found for: "CEA", "CA125", "LABCA2"   Allergens No results found for: "ALMOND", "APPLE", "ASPARAGUS", "AVOCADO", "BANANA", "BARLEY", "BASIL",  "BAYLEAF", "GREENBEAN", "LIMABEAN", "WHITEBEAN", "BEEFIGE", "REDBEET", "BLUEBERRY", "BROCCOLI", "CABBAGE", "MELON", "CARROT", "CASEIN", "CASHEWNUT", "CAULIFLOWER", "CELERY"     Note: Lab results reviewed.  PFSH  Drug: Candice Maldonado  reports no history of drug use. Alcohol:  reports that she does not currently use alcohol. Tobacco:  reports that she has never smoked. Her smokeless tobacco use includes chew. Medical:  has a past medical history of Anemia, Arthritis, Breast cancer (HCC) (2000), Colon polyps (2015), Depression, Diabetes (HCC), Ductal carcinoma in situ of breast, Family history of breast cancer, Gallstones, GERD (gastroesophageal reflux disease), HLD (hyperlipidemia), Localized swelling of both lower legs, Neuromuscular disorder (HCC), Obesity, Personal history of chemotherapy, Personal history of malignant neoplasm of breast, and Personal history of radiation therapy. Family: family history includes Breast cancer in her maternal grandmother, mother, and another family member; Breast cancer (age of onset: 60) in her daughter; Breast cancer (age of onset: 42) in her sister; Diabetes in her brother, father, mother, and sister; Irritable bowel syndrome in her daughter; Stroke in her father.  Past Surgical History:  Procedure Laterality Date   BACK SURGERY  2011   BREAST LUMPECTOMY     CHOLECYSTECTOMY  1980   EVACUATION BREAST HEMATOMA Bilateral 11/26/2020   Procedure: EVACUATION POST-OP CHEST WALL HEMATOMA;  Surgeon: Manus Rudd, MD;  Location: [facility] OR;  Service: General;  Laterality: Bilateral;   LEFT MASTECTOMY AND RIGHT PROPHYLACTIC MASTECTOMY (Bilateral Breast)  11/25/2020   TOTAL MASTECTOMY Bilateral 11/25/2020   Procedure: LEFT MASTECTOMY AND RIGHT PROPHYLACTIC MASTECTOMY;  Surgeon: Manus Rudd, MD;  Location: [facility] OR;  Service: General;  Laterality: Bilateral;  LMA, PEC BLOCK; START TIME OF 7:30 AM FOR 120 MINUTES TSUEI IQ   TUBAL LIGATION  1990   Active Ambulatory Problems     Diagnosis Date Noted   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   Family history of breast cancer    Personal history of malignant neoplasm of breast    Ductal carcinoma in situ of breast    Genetic testing 10/14/2020   Neoplasm of left breast, primary tumor staging category Tis: ductal carcinoma in situ (DCIS) 11/25/2020   Failed back surgical syndrome 07/17/2023   Spinal stenosis, lumbar region, with neurogenic claudication 07/17/2023   Chronic radicular lumbar pain (left>right) 07/17/2023   Degeneration of intervertebral disc of lumbar region with discogenic back pain and lower extremity pain 07/17/2023   Chronic pain syndrome 07/17/2023   Resolved Ambulatory Problems    Diagnosis Date Noted   No Resolved Ambulatory Problems   Past Medical History:  Diagnosis Date   Anemia    Arthritis    Breast cancer (HCC) 2000   Colon polyps 2015   Depression    Diabetes (HCC)    Gallstones    GERD (gastroesophageal reflux disease)    HLD (hyperlipidemia)    Localized swelling of both lower legs    Neuromuscular disorder (HCC)    Obesity    Personal history of chemotherapy    Personal history of radiation therapy    Constitutional Exam  General appearance: Well nourished, well developed, and well hydrated. In no apparent acute distress Vitals:   07/17/23 1340  BP: 137/83  Pulse: 76  Resp: 18  Temp: (!) 97.4 F (36.3 C)  SpO2: 98%  Weight: 219 lb (99.3 kg)  Height: 5\' 3"  (1.6 m)   BMI Assessment: Estimated body mass index is 38.79 kg/m as calculated from the following:   Height as of this encounter: 5\' 3"  (1.6 m).   Weight as of this encounter: 219 lb (99.3 kg).  BMI interpretation table: BMI level Category Range association with higher incidence of chronic pain  <18 kg/m2 Underweight   18.5-24.9 kg/m2 Ideal body weight   25-29.9 kg/m2 Overweight Increased incidence by 20%  30-34.9 kg/m2 Obese (Class I) Increased incidence by 68%  35-39.9 kg/m2 Severe  obesity (Class  II) Increased incidence by 136%  >40 kg/m2 Extreme obesity (Class III) Increased incidence by 254%   Patient's current BMI Ideal Body weight  Body mass index is 38.79 kg/m. Ideal body weight: 52.4 kg (115 lb 8.3 oz) Adjusted ideal body weight: 71.2 kg (156 lb 14.6 oz)   BMI Readings from Last 4 Encounters:  07/17/23 38.79 kg/m  12/14/22 37.25 kg/m  09/25/22 35.61 kg/m  08/15/22 35.77 kg/m   Wt Readings from Last 4 Encounters:  07/17/23 219 lb (99.3 kg)  12/14/22 217 lb (98.4 kg)  09/25/22 214 lb (97.1 kg)  08/15/22 214 lb 15.2 oz (97.5 kg)    Psych/Mental status: Alert, oriented x 3 (person, place, & time)       Eyes: PERLA Respiratory: No evidence of acute respiratory distress  Cervical Spine Area Exam  Skin & Axial Inspection: No masses, redness, edema, swelling, or associated skin lesions Alignment: Symmetrical Functional ROM: Pain restricted ROM      Stability: No instability detected Muscle Tone/Strength: Functionally intact. No obvious neuro-muscular anomalies detected. Sensory (Neurological): Dermatomal pain pattern left C5/6 Palpation: No palpable anomalies             Upper Extremity (UE) Exam    Side: Right upper extremity  Side: Left upper extremity  Skin & Extremity Inspection: Skin color, temperature, and hair growth are WNL. No peripheral edema or cyanosis. No masses, redness, swelling, asymmetry, or associated skin lesions. No contractures.  Skin & Extremity Inspection: Skin color, temperature, and hair growth are WNL. No peripheral edema or cyanosis. No masses, redness, swelling, asymmetry, or associated skin lesions. No contractures.  Functional ROM: Unrestricted ROM          Functional ROM: Pain restricted ROM for shoulder and elbow  Muscle Tone/Strength: Functionally intact. No obvious neuro-muscular anomalies detected.  Muscle Tone/Strength: Functionally intact. No obvious neuro-muscular anomalies detected.  Sensory (Neurological):  Unimpaired          Sensory (Neurological): Dermatomal pain pattern          Palpation: No palpable anomalies              Palpation: No palpable anomalies              Provocative Test(s):  Phalen's test: deferred Tinel's test: deferred Apley's scratch test (touch opposite shoulder):  Action 1 (Across chest): deferred Action 2 (Overhead): deferred Action 3 (LB reach): deferred   Provocative Test(s):  Phalen's test: deferred Tinel's test: deferred Apley's scratch test (touch opposite shoulder):  Action 1 (Across chest): Decreased ROM Action 2 (Overhead): Decreased ROM Action 3 (LB reach): Decreased ROM    Thoracic Spine Area Exam  Skin & Axial Inspection: No masses, redness, or swelling Alignment: Symmetrical Functional ROM: Unrestricted ROM Stability: No instability detected Muscle Tone/Strength: Functionally intact. No obvious neuro-muscular anomalies detected. Sensory (Neurological): Unimpaired Muscle strength & Tone: No palpable anomalies Lumbar Spine Area Exam  Skin & Axial Inspection: Well healed scar from previous spine surgery detected Alignment: Symmetrical Functional ROM: Pain restricted ROM affecting both sides Stability: No instability detected Muscle Tone/Strength: Functionally intact. No obvious neuro-muscular anomalies detected. Sensory (Neurological): Dermatomal pain pattern Palpation: Complains of area being tender to palpation       Provocative Tests: Hyperextension/rotation test: (+) due to fusion restriction. Lumbar quadrant test (Kemp's test): (+) bilateral for foraminal stenosis Lateral bending test: (+) ipsilateral radicular pain, bilaterally. Positive for bilateral foraminal stenosis.  Gait & Posture Assessment  Ambulation: Patient ambulates using a cane Gait: Antalgic gait (limping)  Posture: Difficulty standing up straight, due to pain  Lower Extremity Exam    Side: Right lower extremity  Side: Left lower extremity  Stability: No instability  observed          Stability: No instability observed          Skin & Extremity Inspection: Skin color, temperature, and hair growth are WNL. No peripheral edema or cyanosis. No masses, redness, swelling, asymmetry, or associated skin lesions. No contractures.  Skin & Extremity Inspection: Skin color, temperature, and hair growth are WNL. No peripheral edema or cyanosis. No masses, redness, swelling, asymmetry, or associated skin lesions. No contractures.  Functional ROM: Pain restricted ROM for hip and knee joints          Functional ROM: Pain restricted ROM for hip and knee joints          Muscle Tone/Strength: Functionally intact. No obvious neuro-muscular anomalies detected.  Muscle Tone/Strength: Functionally intact. No obvious neuro-muscular anomalies detected.  Sensory (Neurological): Dermatomal pain pattern        Sensory (Neurological): Dermatomal pain pattern        DTR: Patellar: deferred today Achilles: deferred today Plantar: deferred today  DTR: Patellar: deferred today Achilles: deferred today Plantar: deferred today  Palpation: No palpable anomalies  Palpation: No palpable anomalies    Assessment  Primary Diagnosis & Pertinent Problem List: The primary encounter diagnosis was Failed back surgical syndrome. 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  Visit Diagnosis (New problems to examiner): 1. Failed back surgical syndrome   2. 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 Spinal stenosis, lumbar region, with neurogenic claudication   5.  Degeneration of intervertebral disc of lumbar region with discogenic back pain and lower extremity pain   6. Cervical radicular pain (left)   7. Chronic pain syndrome    Plan of Care (Initial workup plan)  Note: Candice Maldonado was reminded that as per protocol, today's visit has been an evaluation only. We have not taken over the patient's controlled substance management.  General Recommendations: The pain condition that the patient suffers from is best treated with a multidisciplinary approach that involves an increase in physical activity to prevent de-conditioning and worsening of the pain cycle, as well as psychological counseling (formal and/or informal) to address the co-morbid psychological affects of pain. Treatment will often involve judicious use of pain medications and interventional procedures to decrease the pain, allowing the patient to participate in the physical activity that will ultimately produce long-lasting pain reductions. The goal of the multidisciplinary approach is to return the patient to a higher level of overall function and to restore their ability to perform activities of daily living.   Lab Orders         Compliance Drug Analysis, Ur     Procedure Orders         Lumbar Transforaminal Epidural     Lumbar TF ESI for lumbar radicular pain secondary to adjacent segment disease Discussed SCS  Consider left cervical ESI for cervical radicular pain  Analgesic Pharmacotherapy:  Opioid Analgesics: Norco 10 mg TID prn, will take over   Membrane stabilizer: Adequate regimen  Muscle relaxant: To be determined at a later time  NSAID: To be determined at a later time  Other analgesic(s): To be determined at a later time   Provider-requested follow-up: Return in about 13 days (around 07/30/2023) for B/L L2 TF ESI, in clinic [facility].  No future appointments.  Duration of encounter: .  Total time on encounter, as per AMA guidelines included both the face-to-face and  non-face-to-face time personally spent by the physician and/or other qualified health care professional(s) on the day of the encounter (includes time in activities that require the physician or other qualified health care professional and does not include time in activities normally performed by clinical staff). Physician's time may include the following activities when performed: Preparing to see the patient (e.g., pre-charting review of records, searching for previously ordered imaging, lab work, and nerve conduction tests) Review of prior analgesic pharmacotherapies. Reviewing PMP Interpreting ordered tests (e.g., lab work, imaging, nerve conduction tests) Performing post-procedure evaluations, including interpretation of diagnostic procedures Obtaining and/or reviewing separately obtained history Performing a medically appropriate examination and/or evaluation Counseling and educating the patient/family/caregiver Ordering medications, tests, or procedures Referring and communicating with other health care professionals (when not separately reported) Documenting clinical information in the electronic or other health record Independently interpreting results (not separately reported) and communicating results to the patient/ family/caregiver Care coordination (not separately reported)  Note by: Edward Jolly, MD (TTS technology used. I apologize for any typographical errors that were not detected and corrected.) Date: 07/17/2023; Time: 3:22 PM

## 2023-07-17 NOTE — Progress Notes (Signed)
Safety precautions to be maintained throughout the outpatient stay will include: orient to surroundings, keep bed in low position, maintain call bell within reach at all times, provide assistance with transfer out of bed and ambulation.  

## 2023-07-21 LAB — COMPLIANCE DRUG ANALYSIS, UR

## 2023-07-30 ENCOUNTER — Ambulatory Visit
Payer: 59 | Attending: Student in an Organized Health Care Education/Training Program | Admitting: Student in an Organized Health Care Education/Training Program

## 2023-07-30 ENCOUNTER — Encounter: Payer: Self-pay | Admitting: Student in an Organized Health Care Education/Training Program

## 2023-07-30 ENCOUNTER — Ambulatory Visit
Admission: RE | Admit: 2023-07-30 | Discharge: 2023-07-30 | Disposition: A | Discharge status: Home / Self Care | Payer: 59 | Source: Ambulatory Visit | Attending: Student in an Organized Health Care Education/Training Program | Admitting: Student in an Organized Health Care Education/Training Program

## 2023-07-30 DIAGNOSIS — M48061 Spinal stenosis, lumbar region without neurogenic claudication: Secondary | ICD-10-CM | POA: Diagnosis present

## 2023-07-30 DIAGNOSIS — G8929 Other chronic pain: Secondary | ICD-10-CM | POA: Insufficient documentation

## 2023-07-30 DIAGNOSIS — Z981 Arthrodesis status: Secondary | ICD-10-CM | POA: Insufficient documentation

## 2023-07-30 DIAGNOSIS — M5416 Radiculopathy, lumbar region: Secondary | ICD-10-CM | POA: Insufficient documentation

## 2023-07-30 DIAGNOSIS — M961 Postlaminectomy syndrome, not elsewhere classified: Secondary | ICD-10-CM | POA: Diagnosis not present

## 2023-07-30 DIAGNOSIS — M51369 Other intervertebral disc degeneration, lumbar region without mention of lumbar back pain or lower extremity pain: Secondary | ICD-10-CM | POA: Diagnosis present

## 2023-07-30 DIAGNOSIS — M48062 Spinal stenosis, lumbar region with neurogenic claudication: Secondary | ICD-10-CM | POA: Diagnosis not present

## 2023-07-30 MED ORDER — MIDAZOLAM HCL 2 MG/2ML IJ SOLN: 0.5000 mg | Freq: Once | INTRAMUSCULAR | Status: DC

## 2023-07-30 MED ORDER — LIDOCAINE HCL 2 % IJ SOLN
20.0000 mL | Freq: Once | INTRAMUSCULAR | Status: AC
  Administered 2023-07-30: 400 mg

## 2023-07-30 MED ORDER — DEXAMETHASONE SODIUM PHOSPHATE 10 MG/ML IJ SOLN
10.0000 mg | Freq: Once | INTRAMUSCULAR | Status: AC
  Administered 2023-07-30: 10 mg

## 2023-07-30 MED ORDER — ROPIVACAINE HCL 2 MG/ML IJ SOLN
INTRAMUSCULAR | Status: AC
  Filled 2023-07-30: qty 20

## 2023-07-30 MED ORDER — IOHEXOL 180 MG/ML  SOLN
INTRAMUSCULAR | Status: AC
  Filled 2023-07-30: qty 20

## 2023-07-30 MED ORDER — LIDOCAINE HCL 2 % IJ SOLN
INTRAMUSCULAR | Status: AC
  Filled 2023-07-30: qty 20

## 2023-07-30 MED ORDER — DEXAMETHASONE SODIUM PHOSPHATE 10 MG/ML IJ SOLN
INTRAMUSCULAR | Status: AC
  Filled 2023-07-30: qty 2

## 2023-07-30 MED ORDER — SODIUM CHLORIDE 0.9% FLUSH: 2.0000 mL | Freq: Once | INTRAVENOUS | Status: DC

## 2023-07-30 MED ORDER — LACTATED RINGERS IV SOLN: Freq: Once | INTRAVENOUS | Status: DC

## 2023-07-30 MED ORDER — IOHEXOL 180 MG/ML  SOLN
10.0000 mL | Freq: Once | INTRAMUSCULAR | Status: AC
  Administered 2023-07-30: 10 mL via EPIDURAL

## 2023-07-30 MED ORDER — ROPIVACAINE HCL 2 MG/ML IJ SOLN
2.0000 mL | Freq: Once | INTRAMUSCULAR | Status: AC
  Administered 2023-07-30: 2 mL via EPIDURAL

## 2023-07-30 NOTE — Progress Notes (Signed)
Safety precautions to be maintained throughout the outpatient stay will include: orient to surroundings, keep bed in low position, maintain call bell within reach at all times, provide assistance with transfer out of bed and ambulation.  

## 2023-07-30 NOTE — Patient Instructions (Signed)
Pain Management Discharge Instructions  General Discharge Instructions :  If you need to reach your doctor call: Monday-Friday 8:00 am - 4:00 pm at [phone] or toll free [phone].  After clinic hours [phone] to have operator reach doctor.  Bring all of your medication bottles to all your appointments in the pain clinic.  To cancel or reschedule your appointment with Pain Management please remember to call 24 hours in advance to avoid a fee.  Refer to the educational materials which you have been given on: General Risks, I had my Procedure. Discharge Instructions, Post Sedation.  Post Procedure Instructions:  The drugs you were given will stay in your system until tomorrow, so for the next 24 hours you should not drive, make any legal decisions or drink any alcoholic beverages.  You may eat anything you prefer, but it is better to start with liquids then soups and crackers, and gradually work up to solid foods.  Please notify your doctor immediately if you have any unusual bleeding, trouble breathing or pain that is not related to your normal pain.  Depending on the type of procedure that was done, some parts of your body may feel week and/or numb.  This usually clears up by tonight or the next day.  Walk with the use of an assistive device or accompanied by an adult for the 24 hours.  You may use ice on the affected area for the first 24 hours.  Put ice in a Ziploc bag and cover with a towel and place against area 15 minutes on 15 minutes off.  You may switch to heat after 24 hours.Selective Nerve Root Block Patient Information  Description: Specific nerve roots exit the spinal canal and these nerves can be compressed and inflamed by a bulging disc and bone spurs.  By injecting steroids on the nerve root, we can potentially decrease the inflammation surrounding these nerves, which often leads to decreased pain.  Also, by injecting local anesthesia on the nerve root, this can  provide us helpful information to give to your referring doctor if it decreases your pain.  Selective nerve root blocks can be done along the spine from the neck to the low back depending on the location of your pain.   After numbing the skin with local anesthesia, a small needle is passed to the nerve root and the position of the needle is verified using x-ray pictures.  After the needle is in correct position, we then deposit the medication.  You may experience a pressure sensation while this is being done.  The entire block usually lasts less than 15 minutes.  Conditions that may be treated with selective nerve root blocks: Low back and leg pain Spinal stenosis Diagnostic block prior to potential surgery Neck and arm pain Post laminectomy syndrome  Preparation for the injection:  Do not eat any solid food or dairy products within 8 hours of your appointment. You may drink clear liquids up to 3 hours before an appointment.  Clear liquids include water, black coffee, juice or soda.  No milk or cream please. You may take your regular medications, including pain medications, with a sip of water before your appointment.  Diabetics should hold regular insulin (if taken separately) and take 1/2 normal NPH dose the morning of the procedure.  Carry some sugar containing items with you to your appointment. A driver must accompany you and be prepared to drive you home after your procedure. Bring all your current medications with you. An IV   may be inserted and sedation may be given at the discretion of the physician. A blood pressure cuff, EKG, and other monitors will often be applied during the procedure.  Some patients may need to have extra oxygen administered for a short period. You will be asked to provide medical information, including allergies, prior to the procedure.  We must know immediately if you are taking blood  Thinners (like Coumadin) or if you are allergic to IV iodine contrast  (dye).  Possible side-effects: All are usually temporary Bleeding from needle site Light headedness Numbness and tingling Decreased blood pressure Weakness in arms/legs Pressure sensation in back/neck Pain at injection site (several days)  Possible complications: All are extremely rare Infection Nerve injury Spinal headache (a headache wore with upright position)  Call if you experience: Fever/chills associated with headache or increased back/neck pain Headache worsened by an upright position New onset weakness or numbness of an extremity below the injection site Hives or difficulty breathing (go to the emergency room) Inflammation or drainage at the injection site(s) Severe back/neck pain greater than usual New symptoms which are concerning to you  Please note:  Although the local anesthetic injected can often make your back or neck feel good for several hours after the injection the pain will likely return.  It takes 3-5 days for steroids to work on the nerve root. You may not notice any pain relief for at least one week.  If effective, we will often do a series of 3 injections spaced 3-6 weeks apart to maximally decrease your pain.    If you have any questions, please call [phone]  [facility] 

## 2023-07-30 NOTE — Progress Notes (Signed)
PROVIDER NOTE: Interpretation of information contained herein should be left to medically-trained personnel. Specific patient instructions are provided elsewhere under "Patient Instructions" section of medical record. This document was created in part using STT-dictation technology, any transcriptional errors that may result from this process are unintentional.  Patient: Candice Maldonado Type: Established DOB: May 16, 1956 MRN: [id number] PCP: Dalbert Mayotte III, PA-C  Service: Procedure DOS: 07/30/2023 Setting: Ambulatory Location: Ambulatory outpatient facility Delivery: Face-to-face Provider: Edward Jolly, MD Specialty: Interventional Pain Management Specialty designation: 09 Location: Outpatient facility Ref. Prov.: Edward Jolly, MD       Interventional Therapy   Procedure: Lumbar trans-foraminal epidural steroid injection (L-TFESI) #1  Laterality: Bilateral (-50)  Level: L2 nerve root(s) Imaging: Fluoroscopy-guided         Anesthesia: Local anesthesia (1-2% Lidocaine) Sedation: No Sedation                       DOS: 07/30/2023  Performed by: Edward Jolly, MD  Purpose: Diagnostic/Therapeutic Indications: Lumbar radicular pain severe enough to impact quality of life or function. 1. Failed back surgical syndrome   2. 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 Spinal stenosis, lumbar region, with neurogenic claudication    NAS-11 Pain score:   Pre-procedure: 7 /10   Post-procedure: 2  (hips bilateral)/10     Position / Prep / Materials:  Position: Prone  Prep solution: ChloraPrep (2% chlorhexidine gluconate and 70% isopropyl alcohol) Prep Area: Entire Posterior Lumbosacral Area.  From the lower tip of the scapula down to the tailbone and from flank to flank. Materials:  Tray: Block Needle(s):  Type: Spinal  Gauge (G): 22  Length: 5-in  Qty: 2      H&P (Pre-op Assessment):   Candice Maldonado is a 67 y.o. (year old), female patient, seen today for interventional treatment. 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 Candice Maldonado has a current medication list which includes the following prescription(s): ascorbic acid, aspirin ec, atorvastatin, black cohosh, cholecalciferol, elderberry, escitalopram, famotidine, hydrochlorothiazide, hydrocodone-acetaminophen, methocarbamol, multivitamin, naloxone, omeprazole, pregabalin, pyridoxine, and cyanocobalamin, and the following Facility-Administered Medications: sodium chloride flush. Her primarily concern today is the Back Pain (lower)  Initial Vital Signs:  Pulse/HCG Rate: 77ECG Heart Rate: 68 Temp: (!) 97.2 F (36.2 C) Resp: 18 BP: 129/82 SpO2: 99 %  BMI: Estimated body mass index is 37.73 kg/m as calculated from the following:   Height as of this encounter: 5\' 3"  (1.6 m).   Weight as of this encounter: 213 lb (96.6 kg).  Risk Assessment: Allergies: Reviewed. She is allergic to tape.  Allergy Precautions: None required Coagulopathies: Reviewed. None identified.  Blood-thinner therapy: None at this time Active Infection(s): Reviewed. None identified. Candice Maldonado is afebrile  Site Confirmation: Candice Maldonado was asked to confirm the procedure and laterality before marking the site Procedure checklist: Completed Consent: Before the procedure and under the influence of no sedative(s), amnesic(s), or anxiolytics, the patient was informed of the treatment options, risks and possible complications. To fulfill our ethical and legal obligations, as recommended by the American Medical Association's Code of Ethics, I have informed the patient of my clinical impression; the  nature and purpose of the treatment or procedure; the risks, benefits, and possible  complications of the intervention; the alternatives, including doing nothing; the risk(s) and benefit(s) of the alternative treatment(s) or procedure(s); and the risk(s) and benefit(s) of doing nothing. The patient was provided information about the general risks and possible complications associated with the procedure. These may include, but are not limited to: failure to achieve desired goals, infection, bleeding, organ or nerve damage, allergic reactions, paralysis, and death. In addition, the patient was informed of those risks and complications associated to Spine-related procedures, such as failure to decrease pain; infection (i.e.: Meningitis, epidural or intraspinal abscess); bleeding (i.e.: epidural hematoma, subarachnoid hemorrhage, or any other type of intraspinal or peri-dural bleeding); organ or nerve damage (i.e.: Any type of peripheral nerve, nerve root, or spinal cord injury) with subsequent damage to sensory, motor, and/or autonomic systems, resulting in permanent pain, numbness, and/or weakness of one or several areas of the body; allergic reactions; (i.e.: anaphylactic reaction); and/or death. Furthermore, the patient was informed of those risks and complications associated with the medications. These include, but are not limited to: allergic reactions (i.e.: anaphylactic or anaphylactoid reaction(s)); adrenal axis suppression; blood sugar elevation that in diabetics may result in ketoacidosis or comma; water retention that in patients with history of congestive heart failure may result in shortness of breath, pulmonary edema, and decompensation with resultant heart failure; weight gain; swelling or edema; medication-induced neural toxicity; particulate matter embolism and blood vessel occlusion with resultant organ, and/or nervous system infarction; and/or aseptic necrosis of one or more joints. Finally, the  patient was informed that Medicine is not an exact science; therefore, there is also the possibility of unforeseen or unpredictable risks and/or possible complications that may result in a catastrophic outcome. The patient indicated having understood very clearly. We have given the patient no guarantees and we have made no promises. Enough time was given to the patient to ask questions, all of which were answered to the patient's satisfaction. Ms. Sether has indicated that she wanted to continue with the procedure. Attestation: I, the ordering provider, attest that I have discussed with the patient the benefits, risks, side-effects, alternatives, likelihood of achieving goals, and potential problems during recovery for the procedure that I have provided informed consent. Date  Time: 07/30/2023  8:55 AM   Pre-Procedure Preparation:  Monitoring: As per clinic protocol. Respiration, ETCO2, SpO2, BP, heart rate and rhythm monitor placed and checked for adequate function Safety Precautions: Patient was assessed for positional comfort and pressure points before starting the procedure. Time-out: I initiated and conducted the "Time-out" before starting the procedure, as per protocol. The patient was asked to participate by confirming the accuracy of the "Time Out" information. Verification of the correct person, site, and procedure were performed and confirmed by me, the nursing staff, and the patient. "Time-out" conducted as per Joint Commission's Universal Protocol (UP.01.01.01). Time: 0935 Start Time: 0935 hrs.  Description/Narrative of Procedure:          Target: The 6 o'clock position under the pedicle, on the affected side. Region: Posterolateral Lumbosacral Approach: Posterior Percutaneous Paravertebral approach.  Rationale (medical necessity): procedure needed and proper for the diagnosis and/or treatment of the patient's medical symptoms and needs. Procedural Technique Safety Precautions:  Aspiration looking for blood return was conducted prior to all injections. At no point did we inject any substances, as a needle was being advanced. No attempts were made at seeking any paresthesias. Safe injection practices and needle disposal techniques used. Medications properly checked for expiration dates. SDV (single dose vial) medications used. Description of  the Procedure: Protocol guidelines were followed. The patient was placed in position over the procedure table. The target area was identified and the area prepped in the usual manner. Skin & deeper tissues infiltrated with local anesthetic. Appropriate amount of time allowed to pass for local anesthetics to take effect. The procedure needles were then advanced to the target area. Proper needle placement secured. Negative aspiration confirmed. Solution injected in intermittent fashion, asking for systemic symptoms every 0.5cc of injectate. The needles were then removed and the area cleansed, making sure to leave some of the prepping solution back to take advantage of its long term bactericidal properties.  5 cc solution made of 2 cc of 0.2% ropivacaine, 2 cc of Decadron 10 mg/cc, 1 cc of preservative-free saline.  2.5 cc injected to the left L2 nerve, 2.5 cc injected for the right L2 nerve  Vitals:   07/30/23 0935 07/30/23 0941 07/30/23 0949 07/30/23 0958  BP: (!) 141/95 (!) 150/98 (!) 178/109 (!) 150/101  Pulse:      Resp: 18 18 16 16   Temp:      TempSrc:      SpO2: 99% 100% 99% 98%  Weight:      Height:        Start Time: 0935 hrs. End Time:   hrs.  Imaging Guidance (Spinal):          Type of Imaging Technique: Fluoroscopy Guidance (Spinal) Indication(s): Assistance in needle guidance and placement for procedures requiring needle placement in or near specific anatomical locations not easily accessible without such assistance. Exposure Time: Please see nurses notes. Contrast: Before injecting any contrast, we confirmed that the  patient did not have an allergy to iodine, shellfish, or radiological contrast. Once satisfactory needle placement was completed at the desired level, radiological contrast was injected. Contrast injected under live fluoroscopy. No contrast complications. See chart for type and volume of contrast used. Fluoroscopic Guidance: I was personally present during the use of fluoroscopy. "Tunnel Vision Technique" used to obtain the best possible view of the target area. Parallax error corrected before commencing the procedure. "Direction-depth-direction" technique used to introduce the needle under continuous pulsed fluoroscopy. Once target was reached, antero-posterior, oblique, and lateral fluoroscopic projection used confirm needle placement in all planes. Images permanently stored in EMR. Interpretation: I personally interpreted the imaging intraoperatively. Adequate needle placement confirmed in multiple planes. Appropriate spread of contrast into desired area was observed. No evidence of afferent or efferent intravascular uptake. No intrathecal or subarachnoid spread observed. Permanent images saved into the patient's record.  Post-operative Assessment:  Post-procedure Vital Signs:  Pulse/HCG Rate: 7772 Temp: (!) 97.2 F (36.2 C) Resp: 16 BP: (!) 150/101 SpO2: 98 %  EBL: None  Complications: No immediate post-treatment complications observed by team, or reported by patient.  Note: The patient tolerated the entire procedure well. A repeat set of vitals were taken after the procedure and the patient was kept under observation following institutional policy, for this type of procedure. Post-procedural neurological assessment was performed, showing return to baseline, prior to discharge. The patient was provided with post-procedure discharge instructions, including a section on how to identify potential problems. Should any problems arise concerning this procedure, the patient was given instructions to  immediately contact us, at any time, without hesitation. In any case, we plan to contact the patient by telephone for a follow-up status report regarding this interventional procedure.  Comments:  No additional relevant information.  Plan of Care (POC)  Orders:  Orders Placed This Encounter  Procedures   DG PAIN CLINIC C-ARM 1-60 MIN NO REPORT    Intraoperative interpretation by procedural physician at [facility].    Standing Status:   Standing    Number of Occurrences:   1    Order Specific Question:   Reason for exam:    Answer:   Assistance in needle guidance and placement for procedures requiring needle placement in or near specific anatomical locations not easily accessible without such assistance.     Medications ordered for procedure: Meds ordered this encounter  Medications   iohexol (OMNIPAQUE) 180 MG/ML injection 10 mL    Must be Myelogram-compatible. If not available, you may substitute with a water-soluble, non-ionic, hypoallergenic, myelogram-compatible radiological contrast medium.   lidocaine (XYLOCAINE) 2 % (with pres) injection 400 mg   DISCONTD: lactated ringers infusion   DISCONTD: midazolam (VERSED) injection 0.5-2 mg    Make sure Flumazenil is available in the pyxis when using this medication. If oversedation occurs, administer 0.2 mg IV over 15 sec. If after 45 sec no response, administer 0.2 mg again over 1 min; may repeat at 1 min intervals; not to exceed 4 doses (1 mg)   ropivacaine (PF) 2 mg/mL (0.2%) (NAROPIN) injection 2 mL   sodium chloride flush (NS) 0.9 % injection 2 mL   dexamethasone (DECADRON) injection 10 mg   dexamethasone (DECADRON) injection 10 mg   Medications administered: We administered iohexol, lidocaine, ropivacaine (PF) 2 mg/mL (0.2%), dexamethasone, and dexamethasone.  See the medical record for exact dosing, route, and time of administration.  Follow-up plan:   Return in about 3 weeks (around 08/20/2023) for PPE, F2F.       Bilateral L2 lumbar transforaminal epidural steroid injection 07/30/2023    Recent Visits Date Type Provider Dept  07/17/23 Office Visit Edward Jolly, MD [facility]  Showing recent visits within past 90 days and meeting all other requirements Today's Visits Date Type Provider Dept  07/30/23 Procedure visit Edward Jolly, MD [facility]  Showing today's visits and meeting all other requirements Future Appointments No visits were found meeting these conditions. Showing future appointments within next 90 days and meeting all other requirements  Disposition: Discharge home  Discharge (Date  Time): 07/30/2023; 1001 hrs.   Primary Care Physician: Dalbert Mayotte III, PA-C Location: [facility] Note by: Edward Jolly, MD (TTS technology used. I apologize for any typographical errors that were not detected and corrected.) Date: 07/30/2023; Time: 10:03 AM  Disclaimer:  Medicine is not an Visual merchandiser. The only guarantee in medicine is that nothing is guaranteed. It is important to note that the decision to proceed with this intervention was based on the information collected from the patient. The Data and conclusions were drawn from the patient's questionnaire, the interview, and the physical examination. Because the information was provided in large part by the patient, it cannot be guaranteed that it has not been purposely or unconsciously manipulated. Every effort has been made to obtain as much relevant data as possible for this evaluation. It is important to note that the conclusions that lead to this procedure are derived in large part from the available data. Always take into account that the treatment will also be dependent on availability of resources and existing treatment guidelines, considered by other Pain Management Practitioners as being common knowledge and practice, at the time of the intervention. For Medico-Legal purposes, it  is also important to point out that variation in procedural techniques and pharmacological choices  are the acceptable norm. The indications, contraindications, technique, and results of the above procedure should only be interpreted and judged by a Board-Certified Interventional Pain Specialist with extensive familiarity and expertise in the same exact procedure and technique.

## 2023-07-31 ENCOUNTER — Telehealth: Payer: Self-pay

## 2023-07-31 NOTE — Telephone Encounter (Signed)
Post procedure follow up.  Patient states she is doing good.  

## 2023-08-27 ENCOUNTER — Ambulatory Visit: Payer: 59 | Admitting: Student in an Organized Health Care Education/Training Program

## 2024-01-08 ENCOUNTER — Emergency Department
Admission: EM | Admit: 2024-01-08 | Discharge: 2024-01-08 | Disposition: A | Discharge status: Home / Self Care | Attending: Emergency Medicine | Admitting: Emergency Medicine

## 2024-01-08 ENCOUNTER — Emergency Department

## 2024-01-08 ENCOUNTER — Other Ambulatory Visit: Payer: Self-pay

## 2024-01-08 DIAGNOSIS — M1990 Unspecified osteoarthritis, unspecified site: Secondary | ICD-10-CM

## 2024-01-08 DIAGNOSIS — M25511 Pain in right shoulder: Secondary | ICD-10-CM | POA: Diagnosis present

## 2024-01-08 DIAGNOSIS — M19011 Primary osteoarthritis, right shoulder: Secondary | ICD-10-CM | POA: Insufficient documentation

## 2024-01-08 DIAGNOSIS — E119 Type 2 diabetes mellitus without complications: Secondary | ICD-10-CM | POA: Insufficient documentation

## 2024-01-08 MED ORDER — MELOXICAM 7.5 MG PO TABS: 7.5000 mg | ORAL_TABLET | Freq: Every day | ORAL | 2 refills | Status: DC

## 2024-01-08 NOTE — ED Notes (Signed)
 Pt d/c home per EDP order. Discharge summary reviewed, verbalize understanding,. NAD. Ambulatory

## 2024-01-08 NOTE — ED Triage Notes (Signed)
 Pt to ED via POV. Pt was visiting and reports his brother made her get checked since she was already here visiting. Pt reports got steroid shot  in her right shoulder 3 weeks ago and has been having pain since.

## 2024-01-08 NOTE — ED Provider Notes (Signed)
 [facility] Provider Note    Event Date/Time   First MD Initiated Contact with Patient 01/08/24 1803     (approximate)  History   Chief Complaint: Shoulder Pain  HPI  Candice Maldonado is a 68 y.o. female with a past history of anemia, arthritis, diabetes, gastric reflux, presents to the emergency department for right shoulder pain.  According to the patient for the past several weeks she has been experiencing worsening pain in the right shoulder.  Patient has a history of arthritis and states she will occasionally get pain in the right shoulder.  Patient states she recently saw her doctor who is giving her a steroid injection in the back mentioned that her right shoulder has been hurting so they gave her an injection in the shoulder as well.  She states this was approximately 3 weeks ago but said she has had continued pain in the right shoulder.  Physical Exam   Triage Vital Signs: ED Triage Vitals  Encounter Vitals Group     BP 01/08/24 1608 (!) 143/92     Systolic BP Percentile --      Diastolic BP Percentile --      Pulse Rate 01/08/24 1608 94     Resp 01/08/24 1608 20     Temp 01/08/24 1608 98.1 F (36.7 C)     Temp Source 01/08/24 1608 Oral     SpO2 01/08/24 1608 100 %     Weight --      Height --      Head Circumference --      Peak Flow --      Pain Score 01/08/24 1607 7     Pain Loc --      Pain Education --      Exclude from Growth Chart --     Most recent vital signs: Vitals:   01/08/24 1608  BP: (!) 143/92  Pulse: 94  Resp: 20  Temp: 98.1 F (36.7 C)  SpO2: 100%    General: Awake, no distress.  CV:  Good peripheral perfusion.  Regular rate and rhythm  Resp:  Normal effort.  Equal breath sounds bilaterally.  Abd:  No distention.   Other:  Patient has pain with range of motion of the shoulder, active and passive, neurovascularly intact distally.  Suspect this is more arthritic discomfort.   ED Results / Procedures /  Treatments   RADIOLOGY  I have reviewed and interpreted the shoulder x-ray images.  No fracture or dislocation seen on my evaluation.     MEDICATIONS ORDERED IN ED: Medications - No data to display   IMPRESSION / MDM / ASSESSMENT AND PLAN / ED COURSE  I reviewed the triage vital signs and the nursing notes.  Patient's presentation is most consistent with acute illness / injury with system symptoms.  Patient presents emergency department for several months of right shoulder pain although worse over the last few weeks since getting a corticosteroid injection per patient.  Overall the patient appears well, reassuring exam, reassuring x-rays.  Patient has a history of arthritis I suspect that the patient could be experiencing an arthritic flare especially given her pain with range of motion active and passive.  Neurovascularly intact distally with good sensation and strength.  We will place the patient on meloxicam have the patient follow-up with her doctor.  Patient agreeable to plan of care.  FINAL CLINICAL IMPRESSION(S) / ED DIAGNOSES   Osteoarthritis Shoulder pain    Note:  This  document was prepared using Conservation officer, historic buildings and may include unintentional dictation errors.   Minna Antis, MD 01/08/24 1850

## 2024-01-08 NOTE — ED Provider Triage Note (Signed)
 Emergency Medicine Provider Triage Evaluation Note  Candice Maldonado , a 68 y.o. female  was evaluated in triage.  Pt complains of right shoulder pain for a while, had a shot in it 3 weeks ago, and has difficulty lifting her right arm above her head. Pt states the shot was a steroid injection.  Review of Systems  Positive: Right shoulder pain Negative:   Physical Exam  There were no vitals taken for this visit. Gen:   Awake, no distress   Resp:  Normal effort  MSK:   Moves extremities without difficulty  Other:    Medical Decision Making  Medically screening exam initiated at 4:06 PM.  Appropriate orders placed.  [name] was informed that the remainder of the evaluation will be completed by another provider, this initial triage assessment does not replace that evaluation, and the importance of remaining in the ED until their evaluation is complete.     Cameron Ali, PA-C 01/08/24 1608

## 2024-05-05 ENCOUNTER — Other Ambulatory Visit: Payer: Self-pay | Admitting: Student

## 2024-05-05 DIAGNOSIS — M19011 Primary osteoarthritis, right shoulder: Secondary | ICD-10-CM

## 2024-05-12 ENCOUNTER — Other Ambulatory Visit: Payer: Self-pay | Admitting: Student

## 2024-05-12 ENCOUNTER — Ambulatory Visit
Admission: RE | Admit: 2024-05-12 | Discharge: 2024-05-12 | Disposition: A | Discharge status: Home / Self Care | Source: Ambulatory Visit | Attending: Student | Admitting: Student

## 2024-05-12 ENCOUNTER — Ambulatory Visit
Admission: RE | Admit: 2024-05-12 | Discharge: 2024-05-12 | Disposition: A | Discharge status: Home / Self Care | Attending: Student | Admitting: Student

## 2024-05-12 DIAGNOSIS — M1712 Unilateral primary osteoarthritis, left knee: Secondary | ICD-10-CM | POA: Insufficient documentation

## 2024-05-12 DIAGNOSIS — M19011 Primary osteoarthritis, right shoulder: Secondary | ICD-10-CM

## 2024-05-24 ENCOUNTER — Emergency Department
Admission: EM | Admit: 2024-05-24 | Discharge: 2024-05-24 | Disposition: A | Discharge status: Home / Self Care | Attending: Emergency Medicine | Admitting: Emergency Medicine

## 2024-05-24 ENCOUNTER — Emergency Department

## 2024-05-24 ENCOUNTER — Other Ambulatory Visit: Payer: Self-pay

## 2024-05-24 DIAGNOSIS — R519 Headache, unspecified: Secondary | ICD-10-CM | POA: Insufficient documentation

## 2024-05-24 DIAGNOSIS — E119 Type 2 diabetes mellitus without complications: Secondary | ICD-10-CM | POA: Diagnosis not present

## 2024-05-24 DIAGNOSIS — R051 Acute cough: Secondary | ICD-10-CM | POA: Insufficient documentation

## 2024-05-24 DIAGNOSIS — M25511 Pain in right shoulder: Secondary | ICD-10-CM | POA: Diagnosis present

## 2024-05-24 LAB — RESP PANEL BY RT-PCR (RSV, FLU A&B, COVID)  RVPGX2
Influenza A by PCR: NEGATIVE
Influenza B by PCR: NEGATIVE
Resp Syncytial Virus by PCR: NEGATIVE
SARS Coronavirus 2 by RT PCR: NEGATIVE

## 2024-05-24 MED ORDER — IPRATROPIUM-ALBUTEROL 0.5-2.5 (3) MG/3ML IN SOLN
3.0000 mL | Freq: Once | RESPIRATORY_TRACT | Status: AC
  Administered 2024-05-24: 3 mL via RESPIRATORY_TRACT
  Filled 2024-05-24: qty 3

## 2024-05-24 MED ORDER — KETOROLAC TROMETHAMINE 15 MG/ML IJ SOLN
15.0000 mg | Freq: Once | INTRAMUSCULAR | Status: AC
  Administered 2024-05-24: 15 mg via INTRAMUSCULAR
  Filled 2024-05-24: qty 1

## 2024-05-24 NOTE — Discharge Instructions (Addendum)
 Your evaluated in the ED for right shoulder pain and a cough.  Your respiratory panel which includes COVID, influenza and RSV is negative.  Your chest x-ray is normal and shows no pneumonia.  Your right shoulder x-ray shows arthritis.  You have been provided with a sling for comfort.  Please schedule appointment with orthopedic for further evaluation.  A viral cough may last up to 2-3 weeks.   Take tylenol  or ibuprofen for pain or fever as directed.   Stay hydrated by drinking plenty of fluids to thin mucus. Get adequate amount of sleep and avoid overexertion. Consider a humidifier at night. Warm teas and a spoonful of honey may help reduce cough frequency. Follow up with your primary care provider as needed.

## 2024-05-24 NOTE — ED Provider Notes (Signed)
 [facility] Emergency Department Provider Note     Event Date/Time   First MD Initiated Contact with Patient 05/24/24 2019     (approximate)   History   Shoulder Pain   HPI  Candice Maldonado is a 68 y.o. female with a history of diabetes and arthritis presents to the ED for evaluation of right shoulder pain for a couple of months.  She reports no injuries or falls on the shoulder.  She was seen by her PCP and had x-rays performed on 08/04.  X-rays were negative.  She reports pain has increased.   Patient also notes productive cough and headache that started yesterday.  Patient reports grandson has similar symptoms.  Denies fever, chest pain, shortness of breath or nausea vomiting.    Physical Exam   Triage Vital Signs: ED Triage Vitals  Encounter Vitals Group     BP 05/24/24 1945 122/72     Girls Systolic BP Percentile --      Girls Diastolic BP Percentile --      Boys Systolic BP Percentile --      Boys Diastolic BP Percentile --      Pulse Rate 05/24/24 1945 (!) 106     Resp 05/24/24 1945 20     Temp 05/24/24 1945 98.6 F (37 C)     Temp Source 05/24/24 1945 Oral     SpO2 05/24/24 1945 95 %     Weight 05/24/24 1946 217 lb (98.4 kg)     Height 05/24/24 1946 5' 3 (1.6 m)     Head Circumference --      Peak Flow --      Pain Score 05/24/24 1945 0     Pain Loc --      Pain Education --      Exclude from Growth Chart --     Most recent vital signs: Vitals:   05/24/24 1945  BP: 122/72  Pulse: (!) 106  Resp: 20  Temp: 98.6 F (37 C)  SpO2: 95%   General: Well appearing and comfortable. Alert and oriented. INAD.  Skin:  Warm, dry and intact. No rashes or lesions noted.     Head:  NCAT.  Eyes:  PERRLA. EOMI.  Throat: Oropharynx clear. No erythema or exudates. Tonsils not enlarged. Uvula is midline. CV:  Good peripheral perfusion. RRR.  RESP:  Normal effort. LCTAB. No retractions.  ABD:  No distention. Soft, Non tender.   MSK:   Limited range of motion to right shoulder with abduction.  No erythema or warmth.  Mild tenderness to posterior lateral humeral head.  Neurovascular status intact all throughout.  ED Results / Procedures / Treatments   Labs (all labs ordered are listed, but only abnormal results are displayed) Labs Reviewed  RESP PANEL BY RT-PCR (RSV, FLU A&B, COVID)  [id number]    RADIOLOGY  I personally viewed and evaluated these images as part of my medical decision making, as well as reviewing the written report by the radiologist.  ED Provider Interpretation: No acute bony abnormality noted to right shoulder x-ray  DG Chest 1 View Result Date: 05/24/2024 CLINICAL DATA:  Cough and right shoulder pain EXAM: CHEST  1 VIEW COMPARISON:  09/25/2022 FINDINGS: Stable cardiomediastinal silhouette. No focal consolidation, pleural effusion, or pneumothorax. No displaced rib fractures. IMPRESSION: No active disease. Electronically Signed   By: Norman Gatlin M.D.   On: 05/24/2024 22:19   DG Shoulder Right Result Date: 05/24/2024 CLINICAL DATA:  Shoulder  pain, no known injury EXAM: RIGHT SHOULDER - 2+ VIEW COMPARISON:  05/12/2024 FINDINGS: Osteoarthritic changes in the right [facility] and glenohumeral joints again noted. No acute bony abnormality. Specifically, no fracture, subluxation, or dislocation. IMPRESSION: Osteoarthritic changes in the right shoulder. No acute bony abnormality. No change since prior study. Electronically Signed   By: Franky Crease M.D.   On: 05/24/2024 20:35    PROCEDURES:  Critical Care performed: No  Procedures   MEDICATIONS ORDERED IN ED: Medications  ketorolac  (TORADOL ) 15 MG/ML injection 15 mg (15 mg Intramuscular Given 05/24/24 2146)  ipratropium-albuterol  (DUONEB) 0.5-2.5 (3) MG/3ML nebulizer solution 3 mL (3 mLs Nebulization Given 05/24/24 2146)     IMPRESSION / MDM / ASSESSMENT AND PLAN / ED COURSE  I reviewed the triage vital signs and the nursing notes.                               Clinical Course as of 05/24/24 2307  Sat May 24, 2024  2235 DG Shoulder Right No acute fracture  [MH]  2236 DG Chest 1 View IMPRESSION: No active disease.   [MH]    Clinical Course User Index [MH] Margrette Rebbeca LABOR, PA-C    68 y.o. female presents to the emergency department for evaluation and treatment of acute on chronic right shoulder pain and cough. See HPI for further details.   Differential diagnosis includes, but is not limited to fracture, strain, adhesive capsulitis, viral URI, PNA  Patient's presentation is most consistent with acute complicated illness / injury requiring diagnostic workup.  Patient is alert and oriented.  She is hemodynamically stable.  Physical exam findings are as stated above. Right shoulder x-ray and chest x-ray are reassuring.  Respiratory panel is negative, however I suspect this to be a viral upper respiratory infection given similar symptoms of her grandson at home.  Sling provided for comfort and advised to follow-up with orthopedic for further management of right shoulder pain.  She verbalized understanding.  She is in stable and satisfactory condition for discharge home at this time.  ED precautions discussed.  FINAL CLINICAL IMPRESSION(S) / ED DIAGNOSES   Final diagnoses:  Acute pain of right shoulder  Acute cough   Rx / DC Orders   ED Discharge Orders     None      Note:  This document was prepared using Dragon voice recognition software and may include unintentional dictation errors.    Margrette, Miko Markwood A, PA-C 05/24/24 2307    Waymond Lorelle Cummins, MD 05/25/24 1728

## 2024-05-24 NOTE — ED Triage Notes (Signed)
 Pt presented to ED POV with c/o right shoulder pain x months. Has been seen by PCP and had xrays done on 05/12/24. States take takes hydrocodone for pain, last dose last night. Pt states right shoulder pain worse last couple of days. Denies trauma/ injury.

## 2024-07-16 ENCOUNTER — Other Ambulatory Visit: Payer: Self-pay | Admitting: Student

## 2024-07-16 DIAGNOSIS — M7521 Bicipital tendinitis, right shoulder: Secondary | ICD-10-CM

## 2024-07-16 DIAGNOSIS — M25811 Other specified joint disorders, right shoulder: Secondary | ICD-10-CM

## 2024-07-16 DIAGNOSIS — M19011 Primary osteoarthritis, right shoulder: Secondary | ICD-10-CM

## 2024-07-16 DIAGNOSIS — G8929 Other chronic pain: Secondary | ICD-10-CM

## 2024-07-16 DIAGNOSIS — M7581 Other shoulder lesions, right shoulder: Secondary | ICD-10-CM

## 2024-07-25 ENCOUNTER — Ambulatory Visit
Admission: RE | Admit: 2024-07-25 | Discharge: 2024-07-25 | Disposition: A | Source: Ambulatory Visit | Attending: Student

## 2024-07-25 DIAGNOSIS — M19011 Primary osteoarthritis, right shoulder: Secondary | ICD-10-CM | POA: Insufficient documentation

## 2024-07-25 DIAGNOSIS — M25811 Other specified joint disorders, right shoulder: Secondary | ICD-10-CM | POA: Insufficient documentation

## 2024-07-25 DIAGNOSIS — G8929 Other chronic pain: Secondary | ICD-10-CM | POA: Insufficient documentation

## 2024-07-25 DIAGNOSIS — M7521 Bicipital tendinitis, right shoulder: Secondary | ICD-10-CM | POA: Insufficient documentation

## 2024-07-25 DIAGNOSIS — M7581 Other shoulder lesions, right shoulder: Secondary | ICD-10-CM | POA: Insufficient documentation

## 2024-07-25 DIAGNOSIS — M25511 Pain in right shoulder: Secondary | ICD-10-CM | POA: Diagnosis present

## 2024-09-26 DIAGNOSIS — S199XXA Unspecified injury of neck, initial encounter: Secondary | ICD-10-CM | POA: Diagnosis present

## 2024-09-26 DIAGNOSIS — S161XXA Strain of muscle, fascia and tendon at neck level, initial encounter: Secondary | ICD-10-CM | POA: Diagnosis not present

## 2024-09-26 DIAGNOSIS — X58XXXA Exposure to other specified factors, initial encounter: Secondary | ICD-10-CM | POA: Diagnosis not present

## 2024-09-26 LAB — CBC WITH DIFFERENTIAL/PLATELET
Abs Immature Granulocytes: 0.03 K/uL (ref 0.00–0.07)
Basophils Absolute: 0 K/uL (ref 0.0–0.1)
Basophils Relative: 0 %
Eosinophils Absolute: 0.1 K/uL (ref 0.0–0.5)
Eosinophils Relative: 1 %
HCT: 36.2 % (ref 36.0–46.0)
Hemoglobin: 11.2 g/dL — ABNORMAL LOW (ref 12.0–15.0)
Immature Granulocytes: 0 %
Lymphocytes Relative: 24 %
Lymphs Abs: 1.9 K/uL (ref 0.7–4.0)
MCH: 25.6 pg — ABNORMAL LOW (ref 26.0–34.0)
MCHC: 30.9 g/dL (ref 30.0–36.0)
MCV: 82.6 fL (ref 80.0–100.0)
Monocytes Absolute: 0.8 K/uL (ref 0.1–1.0)
Monocytes Relative: 10 %
Neutro Abs: 5.2 K/uL (ref 1.7–7.7)
Neutrophils Relative %: 65 %
Platelets: 275 K/uL (ref 150–400)
RBC: 4.38 MIL/uL (ref 3.87–5.11)
RDW: 14.3 % (ref 11.5–15.5)
WBC: 8 K/uL (ref 4.0–10.5)
nRBC: 0 % (ref 0.0–0.2)

## 2024-09-26 NOTE — ED Triage Notes (Signed)
 Pt to ed from home via POV for sudden onset of sharp neck pain and tailbone pain radiating to her legs. Pt is caox4 and in no acute distress. Pt was on her way to [employer] to get her medicine when this happened.

## 2024-09-27 ENCOUNTER — Emergency Department: Admission: EM | Admit: 2024-09-27 | Discharge: 2024-09-27 | Disposition: A

## 2024-09-27 ENCOUNTER — Other Ambulatory Visit: Payer: Self-pay

## 2024-09-27 DIAGNOSIS — S161XXA Strain of muscle, fascia and tendon at neck level, initial encounter: Secondary | ICD-10-CM | POA: Diagnosis not present

## 2024-09-27 LAB — COMPREHENSIVE METABOLIC PANEL WITH GFR
ALT: 8 U/L (ref 0–44)
AST: 18 U/L (ref 15–41)
Albumin: 4.2 g/dL (ref 3.5–5.0)
Alkaline Phosphatase: 89 U/L (ref 38–126)
Anion gap: 16 — ABNORMAL HIGH (ref 5–15)
BUN: 16 mg/dL (ref 8–23)
CO2: 27 mmol/L (ref 22–32)
Calcium: 9.1 mg/dL (ref 8.9–10.3)
Chloride: 100 mmol/L (ref 98–111)
Creatinine, Ser: 0.88 mg/dL (ref 0.44–1.00)
GFR, Estimated: >60 mL/min
Glucose, Bld: 139 mg/dL — ABNORMAL HIGH (ref 70–99)
Potassium: 3.4 mmol/L — ABNORMAL LOW (ref 3.5–5.1)
Sodium: 143 mmol/L (ref 135–145)
Total Bilirubin: 0.3 mg/dL (ref 0.0–1.2)
Total Protein: 7.6 g/dL (ref 6.5–8.1)

## 2024-09-27 MED ORDER — IBUPROFEN 600 MG PO TABS
600.0000 mg | ORAL_TABLET | Freq: Once | ORAL | Status: AC
  Administered 2024-09-27: 600 mg via ORAL
  Filled 2024-09-27: qty 1

## 2024-09-27 MED ORDER — ACETAMINOPHEN 500 MG PO TABS
1000.0000 mg | ORAL_TABLET | Freq: Once | ORAL | Status: AC
  Administered 2024-09-27: 1000 mg via ORAL
  Filled 2024-09-27: qty 2

## 2024-09-27 MED ORDER — DIAZEPAM 5 MG PO TABS
5.0000 mg | ORAL_TABLET | Freq: Once | ORAL | Status: AC
  Administered 2024-09-27: 5 mg via ORAL
  Filled 2024-09-27: qty 1

## 2024-09-27 MED ORDER — METHOCARBAMOL 500 MG PO TABS
500.0000 mg | ORAL_TABLET | Freq: Once | ORAL | Status: AC
  Administered 2024-09-27: 500 mg via ORAL
  Filled 2024-09-27: qty 1

## 2024-09-27 MED ORDER — METHOCARBAMOL 500 MG PO TABS: 500.0000 mg | ORAL_TABLET | Freq: Three times a day (TID) | ORAL | 0 refills | Status: DC | PRN

## 2024-09-27 NOTE — ED Notes (Signed)
 See triage note  Presents with pain from her neck and into tailbone area Denies any fall  States this happened while at [facility]

## 2024-09-27 NOTE — ED Provider Notes (Signed)
 "  [facility] Provider Note    Event Date/Time   First MD Initiated Contact with Patient 09/27/24 0703     (approximate)   History   Torticollis and Tailbone Pain   HPI  Candice Maldonado is a 68 y.o. female presents today with neck pain.  Started earlier last night, sudden onset of right-sided neck pain, initially felt like a crick in the neck, she tried placing some heating pad along the area, but this caused significant increase in pain limited to the right side of the neck.  No visual changes no numbness tingling weakness down the arm, to significant pain.  Did not try taking anything for the pain no history of similar in the past no associated trauma.  She was given some medication while in the waiting room and it did seem to have helped her pain since the initial onset.  She complains of chronic left-sided sciatic pain which has been ongoing for a long period now as well as pain down her right upper extremity that has been a chronic issue but nothing acutely changed today.  No other complaints at this time.     Physical Exam   Triage Vital Signs: ED Triage Vitals  Encounter Vitals Group     BP 09/26/24 2307 (!) 144/88     Girls Systolic BP Percentile --      Girls Diastolic BP Percentile --      Boys Systolic BP Percentile --      Boys Diastolic BP Percentile --      Pulse Rate 09/26/24 2307 100     Resp 09/26/24 2307 18     Temp 09/26/24 2307 99.5 F (37.5 C)     Temp Source 09/26/24 2307 Oral     SpO2 09/26/24 2307 98 %     Weight --      Height 09/26/24 2305 5' 3 (1.6 m)     Head Circumference --      Peak Flow --      Pain Score 09/26/24 2305 10     Pain Loc --      Pain Education --      Exclude from Growth Chart --     Most recent vital signs: Vitals:   09/26/24 2307 09/27/24 0457  BP: (!) 144/88 136/77  Pulse: 100 86  Resp: 18 15  Temp: 99.5 F (37.5 C) 99 F (37.2 C)  SpO2: 98% 98%     General: Awake, no distress.   Vision grossly intact, pupils equal round reactive light bilaterally Neck:  Tenderness along the lateral aspect of the neck particularly along the sternocleidomastoid, I do not appreciate any crepitus or deformity CV:  Good peripheral perfusion.  No appreciated carotid bruits Resp:  Normal effort.  Abd:  No distention.  Other:     ED Results / Procedures / Treatments   Labs (all labs ordered are listed, but only abnormal results are displayed) Labs Reviewed  CBC WITH DIFFERENTIAL/PLATELET - Abnormal; Notable for the following components:      Result Value   Hemoglobin 11.2 (*)    MCH 25.6 (*)    All other components within normal limits  COMPREHENSIVE METABOLIC PANEL WITH GFR - Abnormal; Notable for the following components:   Potassium 3.4 (*)    Glucose, Bld 139 (*)    Anion gap 16 (*)    All other components within normal limits     EKG     RADIOLOGY  PROCEDURES:  Critical Care performed: No  Procedures   MEDICATIONS ORDERED IN ED: Medications  diazepam  (VALIUM ) tablet 5 mg (has no administration in time range)  acetaminophen  (TYLENOL ) tablet 1,000 mg (1,000 mg Oral Given 09/27/24 0112)  ibuprofen  (ADVIL ) tablet 600 mg (600 mg Oral Given 09/27/24 0112)  methocarbamol  (ROBAXIN ) tablet 500 mg (500 mg Oral Given 09/27/24 0112)     IMPRESSION / MDM / ASSESSMENT AND PLAN / ED COURSE  I reviewed the triage vital signs and the nursing notes.                               Patient's presentation is most consistent with acute, uncomplicated illness.  68 year old female who presents today with concern of right-sided neck pain.  She appears well she is not in any acute distress, difficulty rotating her neck towards the right side, seems clinically consistent with a muscular strain involving the sternocleidomastoid.  I do not appreciate significant mastoid tenderness particularly.  Potassium is slightly low but remaining lab work is reassuring here.  I feel reasonable  to hold off on CT imaging given the lack of neurosymptoms and current clinical exam.  Will give a dose of Valium  here, otherwise believe reasonable for discharge home.  I discussed return precautions, she verbalized understanding and is agreeable with the plan.       FINAL CLINICAL IMPRESSION(S) / ED DIAGNOSES   Final diagnoses:  Strain of neck muscle, initial encounter     Rx / DC Orders   ED Discharge Orders          Ordered    methocarbamol  (ROBAXIN ) 500 MG tablet  Every 8 hours PRN        09/27/24 0713             Note:  This document was prepared using Dragon voice recognition software and may include unintentional dictation errors.   Fernand Rossie HERO, MD 09/27/24 [phone]  "

## 2024-09-27 NOTE — Discharge Instructions (Signed)
 You were seen today due to concern of neck pain.  At this time I suspect your symptoms are due to a strain of the muscle in your neck.  I have written for some medication for you to take, please take this as instructed.  Unfortunately this can take a few days before you have improvement of symptoms.  You may take Tylenol  and ibuprofen  as well to help manage your pain.  If you have any worsening of symptoms such as change in vision, lightheadedness, numbness or weakness down your arm, or any other symptoms you find concerning please return to the emergency department immediately for further medical management.  You should otherwise follow-up with your primary doctor for further assessment and evaluation.

## 2024-09-27 NOTE — ED Notes (Signed)
 Pt gave security permission to move her car from circle to parking spot.

## 2024-10-30 ENCOUNTER — Other Ambulatory Visit: Payer: Self-pay | Admitting: Surgery

## 2024-11-10 ENCOUNTER — Inpatient Hospital Stay
Admission: RE | Admit: 2024-11-10 | Discharge: 2024-11-10 | Disposition: A | Source: Ambulatory Visit | Attending: Surgery

## 2024-11-10 ENCOUNTER — Other Ambulatory Visit: Payer: Self-pay

## 2024-11-10 VITALS — BP 134/78 | HR 85 | Resp 14 | Ht 63.0 in | Wt 227.2 lb

## 2024-11-10 DIAGNOSIS — Z01818 Encounter for other preprocedural examination: Secondary | ICD-10-CM | POA: Insufficient documentation

## 2024-11-10 DIAGNOSIS — R8290 Unspecified abnormal findings in urine: Secondary | ICD-10-CM | POA: Insufficient documentation

## 2024-11-10 DIAGNOSIS — R8271 Bacteriuria: Secondary | ICD-10-CM | POA: Insufficient documentation

## 2024-11-10 HISTORY — DX: Unspecified rotator cuff tear or rupture of right shoulder, not specified as traumatic: M75.101

## 2024-11-10 HISTORY — DX: Postlaminectomy syndrome, not elsewhere classified: M96.1

## 2024-11-10 HISTORY — DX: Chronic pain syndrome: G89.4

## 2024-11-10 HISTORY — DX: Type 2 diabetes mellitus without complications: E11.9

## 2024-11-10 HISTORY — DX: Spinal stenosis, lumbar region with neurogenic claudication: M48.062

## 2024-11-10 HISTORY — DX: Family history of other specified conditions: Z84.89

## 2024-11-10 LAB — URINALYSIS, ROUTINE W REFLEX MICROSCOPIC
Glucose, UA: NEGATIVE mg/dL
Hgb urine dipstick: NEGATIVE
Ketones, ur: NEGATIVE mg/dL
Leukocytes,Ua: NEGATIVE
Nitrite: NEGATIVE
Protein, ur: 30 mg/dL — AB
Specific Gravity, Urine: 1.031 — ABNORMAL HIGH (ref 1.005–1.030)
pH: 5 (ref 5.0–8.0)

## 2024-11-10 LAB — SURGICAL PCR SCREEN
MRSA, PCR: NEGATIVE
Staphylococcus aureus: NEGATIVE

## 2024-11-10 NOTE — Patient Instructions (Addendum)
 Your procedure is scheduled on:11-13-24 Thursday Report to the Registration Desk on the 1st floor of the [facility].Then proceed to the 2nd floor Surgery Desk To find out your arrival time, please call [phone] between 1PM - 3PM on:11-12-24 Wednesday If your arrival time is 6:00 am, do not arrive before that time as the [facility] entrance doors do not open until 6:00 am.  REMEMBER: Instructions that are not followed completely may result in serious medical risk, up to and including death; or upon the discretion of your surgeon and anesthesiologist your surgery may need to be rescheduled.  Do not eat food after midnight the night before surgery.  No gum chewing or hard candies.  You may however, drink Water up to 2 hours before you are scheduled to arrive for your surgery. Do not drink anything within 2 hours of your scheduled arrival time.  In addition, your doctor has ordered for you to drink the provided:  Gatorade G2 Drinking this carbohydrate drink up to two hours before surgery helps to reduce insulin resistance and improve patient outcomes. Please complete drinking 2 hours before scheduled arrival time.  One week prior to surgery:Stop NOW (11-10-24) Stop Anti-inflammatories (NSAIDS) such as Advil , Aleve, Ibuprofen , Motrin , Naproxen, Naprosyn and Aspirin based products such as Excedrin, Goody's Powder, BC Powder. You may continue your celecoxib (CELEBREX) up until the day prior to surgery Stop ANY OVER THE COUNTER supplements until after surgery (Vitamin C, Black Cohosh, Elderberry, Multivitamin, Hair Skin and Nails, Super Omega 3, Vitamin B12, Vitamin D)  You may however, continue to take Tylenol  if needed for pain up until the day of surgery.  Continue taking all of your other prescription medications up until the day of surgery.  ON THE DAY OF SURGERY ONLY TAKE THESE MEDICATIONS WITH SIPS OF WATER: -escitalopram (LEXAPRO)  -omeprazole (PRILOSEC)  -pregabalin  (LYRICA )   -HYDROcodone-acetaminophen  (NORCO)   You may continue your 81 mg Aspirin up until the day prior to surgery-Do NOT take the day of surgery  No Alcohol for 24 hours before or after surgery.  No Smoking including e-cigarettes for 24 hours before surgery.  No chewable tobacco products for at least 6 hours before surgery.  No nicotine patches on the day of surgery.  Do not use any recreational drugs for at least a week (preferably 2 weeks) before your surgery.  Please be advised that the combination of cocaine and anesthesia may have negative outcomes, up to and including death. If you test positive for cocaine, your surgery will be cancelled.  On the morning of surgery brush your teeth with toothpaste and water, you may rinse your mouth with mouthwash if you wish. Do not swallow any toothpaste or mouthwash.  Use CHG Soap as directed on instruction sheet.  Do not wear jewelry, make-up, hairpins, clips or nail polish.  For welded (permanent) jewelry: bracelets, anklets, waist bands, etc.  Please have this removed prior to surgery.  If it is not removed, there is a chance that hospital personnel will need to cut it off on the day of surgery.  Do not wear lotions, powders, or perfumes.   Do not shave body hair from the neck down 48 hours before surgery.  Contact lenses, hearing aids and dentures may not be worn into surgery.  Do not bring valuables to the hospital. [facility] is not responsible for any missing/lost belongings or valuables.   Total Shoulder Arthroplasty:  use Benzoyl Peroxide 5% Gel as directed on instruction sheet.  Notify  your doctor if there is any change in your medical condition (cold, fever, infection).  Wear comfortable clothing (specific to your surgery type) to the hospital.  After surgery, you can help prevent lung complications by doing breathing exercises.  Take deep breaths and cough every 1-2 hours. Your doctor may order a device called an Incentive  Spirometer to help you take deep breaths. When coughing or sneezing, hold a pillow firmly against your incision with both hands. This is called splinting. Doing this helps protect your incision. It also decreases belly discomfort.  If you are being admitted to the hospital overnight, leave your suitcase in the car. After surgery it may be brought to your room.  In case of increased patient census, it may be necessary for you, the patient, to continue your postoperative care in the Same Day Surgery department.  If you are being discharged the day of surgery, you will not be allowed to drive home. You will need a responsible individual to drive you home and stay with you for 24 hours after surgery.   If you are taking public transportation, you will need to have a responsible individual with you.  Please call the Pre-admissions Testing Dept. at [phone] if you have any questions about these instructions.  Surgery Visitation Policy:  Patients having surgery or a procedure may have two visitors.  Children under the age of 49 must have an adult with them who is not the patient.  Inpatient Visitation:    Visiting hours are 7 a.m. to 8 p.m. Up to four visitors are allowed at one time in a patient room. The visitors may rotate out with other people during the day.  One visitor age 22 or older may stay with the patient overnight and must be in the room by 8 p.m.    Pre-operative 4 CHG Bath Instructions   You can play a key role in reducing the risk of infection after surgery. Your skin needs to be as free of germs as possible. You can reduce the number of germs on your skin by washing with CHG (chlorhexidine  gluconate) soap before surgery. CHG is an antiseptic soap that kills germs and continues to kill germs even after washing.   DO NOT use if you have an allergy to chlorhexidine /CHG or antibacterial soaps. If your skin becomes reddened or irritated, stop using the CHG and notify one  of our RNs at [phone].   Please shower with the CHG soap starting 4 days before surgery using the following schedule:     Please keep in mind the following:  DO NOT shave, including legs and underarms, starting the day of your first shower.   You may shave your face at any point before/day of surgery.  Place clean sheets on your bed the day you start using CHG soap. Use a clean washcloth (not used since being washed) for each shower. DO NOT sleep with pets once you start using the CHG.   CHG Shower Instructions:  If you choose to wash your hair and private area, wash first with your normal shampoo/soap.  After you use shampoo/soap, rinse your hair and body thoroughly to remove shampoo/soap residue.  Turn the water OFF and apply about 3 tablespoons (45 ml) of CHG soap to a CLEAN washcloth.  Apply CHG soap ONLY FROM YOUR NECK DOWN TO YOUR TOES (washing for 3-5 minutes)  DO NOT use CHG soap on face, private areas, open wounds, or sores.  Pay special attention to the  area where your surgery is being performed.  If you are having back surgery, having someone wash your back for you may be helpful. Wait 2 minutes after CHG soap is applied, then you may rinse off the CHG soap.  Pat dry with a clean towel  Put on clean clothes/pajamas   If you choose to wear lotion, please use ONLY the CHG-compatible lotions on the back of this paper.     Additional instructions for the day of surgery: DO NOT APPLY any lotions, deodorants, cologne, or perfumes.   Put on clean/comfortable clothes.  Brush your teeth.  Ask your nurse before applying any prescription medications to the skin.      CHG Compatible Lotions   Aveeno Moisturizing lotion  Cetaphil Moisturizing Cream  Cetaphil Moisturizing Lotion  Clairol Herbal Essence Moisturizing Lotion, Dry Skin  Clairol Herbal Essence Moisturizing Lotion, Extra Dry Skin  Clairol Herbal Essence Moisturizing Lotion, Normal Skin  Curel Age Defying  Therapeutic Moisturizing Lotion with Alpha Hydroxy  Curel Extreme Care Body Lotion  Curel Soothing Hands Moisturizing Hand Lotion  Curel Therapeutic Moisturizing Cream, Fragrance-Free  Curel Therapeutic Moisturizing Lotion, Fragrance-Free  Curel Therapeutic Moisturizing Lotion, Original Formula  Eucerin Daily Replenishing Lotion  Eucerin Dry Skin Therapy Plus Alpha Hydroxy Crme  Eucerin Dry Skin Therapy Plus Alpha Hydroxy Lotion  Eucerin Original Crme  Eucerin Original Lotion  Eucerin Plus Crme Eucerin Plus Lotion  Eucerin TriLipid Replenishing Lotion  Keri Anti-Bacterial Hand Lotion  Keri Deep Conditioning Original Lotion Dry Skin Formula Softly Scented  Keri Deep Conditioning Original Lotion, Fragrance Free Sensitive Skin Formula  Keri Lotion Fast Absorbing Fragrance Free Sensitive Skin Formula  Keri Lotion Fast Absorbing Softly Scented Dry Skin Formula  Keri Original Lotion  Keri Skin Renewal Lotion Keri Silky Smooth Lotion  Keri Silky Smooth Sensitive Skin Lotion  Nivea Body Creamy Conditioning Oil  Nivea Body Extra Enriched Lotion  Nivea Body Original Lotion  Nivea Body Sheer Moisturizing Lotion Nivea Crme  Nivea Skin Firming Lotion  NutraDerm 30 Skin Lotion  NutraDerm Skin Lotion  NutraDerm Therapeutic Skin Cream  NutraDerm Therapeutic Skin Lotion  ProShield Protective Hand Cream  Provon moisturizing lotion  Preparing for Total Shoulder Arthroplasty  Before surgery, you can play an important role by reducing the number of germs on your skin by using the following products:  Benzoyl Peroxide Gel  o Reduces the number of germs present on the skin  o Applied twice a day to shoulder area starting two days before surgery  Chlorhexidine  Gluconate (CHG) Soap  o An antiseptic cleaner that kills germs and bonds with the skin to continue killing germs even after washing  o Used for showering the night before surgery and morning of surgery  BENZOYL PEROXIDE 5% GEL                                Please do not use if you have an allergy to benzoyl peroxide. If your skin becomes reddened/irritated stop using the benzoyl peroxide.  Starting two days before surgery, apply as follows:  1. Apply benzoyl peroxide in the morning and at night. Apply after taking a shower. If you are not taking a shower, clean entire shoulder front, back, and side along with the armpit with a clean wet washcloth.  2. Place a quarter-sized dollop on your shoulder and rub in thoroughly, making sure to cover the front, back, and side of your shoulder, along with  the armpit.  2 days before ____ AM ____ PM 1 day before ____ AM ____ PM  3. Do this twice a day for two days. (Last application is the night before surgery, AFTER using the CHG soap).  4. Do NOT apply benzoyl peroxide gel on the day of surgery.   Preoperative Educational Videos for Total Hip, Knee and Shoulder Replacements  To better prepare for surgery, please view our videos that explain the physical activity and discharge planning required to have the best surgical recovery at [facility]  [email or web address]  Questions? Call [phone] or email [email or web address]        Community Resource Directory to address health-related social needs:  [email or web address]

## 2024-11-11 LAB — URINE CULTURE

## 2024-11-13 ENCOUNTER — Encounter: Admitting: Urgent Care

## 2024-11-13 ENCOUNTER — Encounter: Payer: Self-pay | Admitting: Surgery

## 2024-11-13 ENCOUNTER — Ambulatory Visit
Admission: RE | Admit: 2024-11-13 | Discharge: 2024-11-13 | Disposition: A | Source: Home / Self Care | Attending: Surgery | Admitting: Surgery

## 2024-11-13 ENCOUNTER — Other Ambulatory Visit: Payer: Self-pay

## 2024-11-13 ENCOUNTER — Ambulatory Visit

## 2024-11-13 ENCOUNTER — Encounter
Admission: RE | Disposition: A | Discharge status: Home / Self Care | Payer: Self-pay | Source: Home / Self Care | Attending: Surgery

## 2024-11-13 DIAGNOSIS — R8271 Bacteriuria: Secondary | ICD-10-CM

## 2024-11-13 DIAGNOSIS — Z01818 Encounter for other preprocedural examination: Secondary | ICD-10-CM

## 2024-11-13 DIAGNOSIS — M75100 Unspecified rotator cuff tear or rupture of unspecified shoulder, not specified as traumatic: Secondary | ICD-10-CM

## 2024-11-13 DIAGNOSIS — R8290 Unspecified abnormal findings in urine: Secondary | ICD-10-CM

## 2024-11-13 DIAGNOSIS — Z01812 Encounter for preprocedural laboratory examination: Secondary | ICD-10-CM

## 2024-11-13 MED ORDER — CEFAZOLIN SODIUM-DEXTROSE 2-4 GM/100ML-% IV SOLN
2.0000 g | INTRAVENOUS | Status: AC
  Administered 2024-11-13: 2 g via INTRAVENOUS

## 2024-11-13 MED ORDER — BUPIVACAINE-EPINEPHRINE (PF) 0.5% -1:200000 IJ SOLN
INTRAMUSCULAR | Status: DC | PRN
  Administered 2024-11-13: 30 mL via INTRAMUSCULAR

## 2024-11-13 MED ORDER — ACETAMINOPHEN 325 MG PO TABS
325.0000 mg | ORAL_TABLET | Freq: Four times a day (QID) | ORAL | Status: DC | PRN
  Administered 2024-11-13: 650 mg via ORAL

## 2024-11-13 MED ORDER — BUPIVACAINE LIPOSOME 1.3 % IJ SUSP
INTRAMUSCULAR | Status: DC | PRN
  Administered 2024-11-13: 10 mL

## 2024-11-13 MED ORDER — CEFAZOLIN SODIUM-DEXTROSE 2-4 GM/100ML-% IV SOLN
INTRAVENOUS | Status: AC
  Filled 2024-11-13: qty 100

## 2024-11-13 MED ORDER — CHLORHEXIDINE GLUCONATE 0.12 % MT SOLN
OROMUCOSAL | Status: AC
  Filled 2024-11-13: qty 15

## 2024-11-13 MED ORDER — ROCURONIUM BROMIDE 10 MG/ML (PF) SYRINGE
PREFILLED_SYRINGE | INTRAVENOUS | Status: AC
  Filled 2024-11-13: qty 20

## 2024-11-13 MED ORDER — METOCLOPRAMIDE HCL 5 MG/ML IJ SOLN: 10.0000 mg | Freq: Once | INTRAMUSCULAR | Status: DC | PRN

## 2024-11-13 MED ORDER — ONDANSETRON HCL 4 MG/2ML IJ SOLN: 4.0000 mg | Freq: Four times a day (QID) | INTRAMUSCULAR | Status: DC | PRN

## 2024-11-13 MED ORDER — KETOROLAC TROMETHAMINE 15 MG/ML IJ SOLN
INTRAMUSCULAR | Status: AC
  Filled 2024-11-13: qty 1

## 2024-11-13 MED ORDER — OXYCODONE HCL 5 MG PO TABS: 5.0000 mg | ORAL_TABLET | ORAL | 0 refills | Status: AC | PRN

## 2024-11-13 MED ORDER — PROPOFOL 1000 MG/100ML IV EMUL
INTRAVENOUS | Status: AC
  Filled 2024-11-13: qty 100

## 2024-11-13 MED ORDER — OXYCODONE HCL 5 MG/5ML PO SOLN: 5.0000 mg | Freq: Once | ORAL | Status: DC | PRN

## 2024-11-13 MED ORDER — LIDOCAINE HCL (CARDIAC) PF 100 MG/5ML IV SOSY
PREFILLED_SYRINGE | INTRAVENOUS | Status: DC | PRN
  Administered 2024-11-13: 60 mg via INTRAVENOUS

## 2024-11-13 MED ORDER — ROCURONIUM BROMIDE 100 MG/10ML IV SOLN
INTRAVENOUS | Status: DC | PRN
  Administered 2024-11-13: 50 mg via INTRAVENOUS
  Administered 2024-11-13: 10 mg via INTRAVENOUS

## 2024-11-13 MED ORDER — DEXAMETHASONE SOD PHOSPHATE PF 10 MG/ML IJ SOLN
INTRAMUSCULAR | Status: AC
  Filled 2024-11-13: qty 3

## 2024-11-13 MED ORDER — OXYCODONE HCL 5 MG PO TABS: 5.0000 mg | ORAL_TABLET | ORAL | Status: DC | PRN

## 2024-11-13 MED ORDER — METOCLOPRAMIDE HCL 10 MG PO TABS: 5.0000 mg | ORAL_TABLET | Freq: Three times a day (TID) | ORAL | Status: DC | PRN

## 2024-11-13 MED ORDER — ARTIFICIAL TEARS OPHTHALMIC OINT
TOPICAL_OINTMENT | OPHTHALMIC | Status: AC
  Filled 2024-11-13: qty 3.5

## 2024-11-13 MED ORDER — ONDANSETRON HCL 4 MG/2ML IJ SOLN
INTRAMUSCULAR | Status: AC
  Filled 2024-11-13: qty 4

## 2024-11-13 MED ORDER — BUPIVACAINE HCL (PF) 0.5 % IJ SOLN
INTRAMUSCULAR | Status: AC
  Filled 2024-11-13: qty 20

## 2024-11-13 MED ORDER — CHLORHEXIDINE GLUCONATE 0.12 % MT SOLN: 15.0000 mL | Freq: Once | OROMUCOSAL | Status: DC

## 2024-11-13 MED ORDER — FENTANYL CITRATE (PF) 50 MCG/ML IJ SOSY
PREFILLED_SYRINGE | INTRAMUSCULAR | Status: AC
  Filled 2024-11-13: qty 1

## 2024-11-13 MED ORDER — LIDOCAINE HCL (PF) 2 % IJ SOLN
INTRAMUSCULAR | Status: AC
  Filled 2024-11-13: qty 10

## 2024-11-13 MED ORDER — DICLOFENAC SODIUM 1 % EX GEL: 1.0000 | CUTANEOUS | Status: AC

## 2024-11-13 MED ORDER — METOCLOPRAMIDE HCL 5 MG/ML IJ SOLN: 5.0000 mg | Freq: Three times a day (TID) | INTRAMUSCULAR | Status: DC | PRN

## 2024-11-13 MED ORDER — SODIUM CHLORIDE 0.9 % IR SOLN
Status: DC | PRN
  Administered 2024-11-13: 3000 mL

## 2024-11-13 MED ORDER — SUGAMMADEX SODIUM 200 MG/2ML IV SOLN
INTRAVENOUS | Status: DC | PRN
  Administered 2024-11-13: 200 mg via INTRAVENOUS

## 2024-11-13 MED ORDER — CEFAZOLIN SODIUM-DEXTROSE 2-4 GM/100ML-% IV SOLN
2.0000 g | Freq: Four times a day (QID) | INTRAVENOUS | Status: DC
  Administered 2024-11-13: 2 g via INTRAVENOUS

## 2024-11-13 MED ORDER — PHENYLEPHRINE HCL-NACL 20-0.9 MG/250ML-% IV SOLN
INTRAVENOUS | Status: DC | PRN
  Administered 2024-11-13: 20 ug/min via INTRAVENOUS

## 2024-11-13 MED ORDER — ORAL CARE MOUTH RINSE: 15.0000 mL | Freq: Once | OROMUCOSAL | Status: DC

## 2024-11-13 MED ORDER — MIDAZOLAM HCL (PF) 2 MG/2ML IJ SOLN
INTRAMUSCULAR | Status: DC | PRN
  Administered 2024-11-13: 2 mg via INTRAVENOUS

## 2024-11-13 MED ORDER — ONDANSETRON HCL 4 MG/2ML IJ SOLN
INTRAMUSCULAR | Status: DC | PRN
  Administered 2024-11-13: 4 mg via INTRAVENOUS

## 2024-11-13 MED ORDER — CHLORHEXIDINE GLUCONATE 0.12 % MT SOLN
15.0000 mL | Freq: Once | OROMUCOSAL | Status: AC
  Administered 2024-11-13: 15 mL via OROMUCOSAL

## 2024-11-13 MED ORDER — PHENYLEPHRINE HCL-NACL 20-0.9 MG/250ML-% IV SOLN
INTRAVENOUS | Status: AC
  Filled 2024-11-13: qty 250

## 2024-11-13 MED ORDER — SODIUM CHLORIDE 0.9 % IV SOLN: INTRAVENOUS | Status: DC

## 2024-11-13 MED ORDER — ONDANSETRON HCL 4 MG PO TABS: 4.0000 mg | ORAL_TABLET | Freq: Four times a day (QID) | ORAL | Status: DC | PRN

## 2024-11-13 MED ORDER — OXYCODONE HCL 5 MG PO TABS: 5.0000 mg | ORAL_TABLET | Freq: Once | ORAL | Status: DC | PRN

## 2024-11-13 MED ORDER — KETOROLAC TROMETHAMINE 15 MG/ML IJ SOLN
15.0000 mg | Freq: Once | INTRAMUSCULAR | Status: AC
  Administered 2024-11-13: 15 mg via INTRAVENOUS

## 2024-11-13 MED ORDER — PROPOFOL 10 MG/ML IV BOLUS
INTRAVENOUS | Status: DC | PRN
  Administered 2024-11-13: 140 mg via INTRAVENOUS
  Administered 2024-11-13: 50 mg via INTRAVENOUS

## 2024-11-13 MED ORDER — DEXAMETHASONE SOD PHOSPHATE PF 10 MG/ML IJ SOLN
INTRAMUSCULAR | Status: DC | PRN
  Administered 2024-11-13: 5 mg via INTRAVENOUS

## 2024-11-13 MED ORDER — KETOROLAC TROMETHAMINE 30 MG/ML IJ SOLN
INTRAMUSCULAR | Status: AC
  Filled 2024-11-13: qty 1

## 2024-11-13 MED ORDER — BUPIVACAINE HCL (PF) 0.5 % IJ SOLN
INTRAMUSCULAR | Status: DC | PRN
  Administered 2024-11-13: 10 mL

## 2024-11-13 MED ORDER — ORAL CARE MOUTH RINSE: 15.0000 mL | Freq: Once | OROMUCOSAL | Status: AC

## 2024-11-13 MED ORDER — BUPIVACAINE LIPOSOME 1.3 % IJ SUSP
INTRAMUSCULAR | Status: AC
  Filled 2024-11-13: qty 10

## 2024-11-13 MED ORDER — FENTANYL CITRATE (PF) 100 MCG/2ML IJ SOLN
INTRAMUSCULAR | Status: DC | PRN
  Administered 2024-11-13: 50 ug via INTRAVENOUS

## 2024-11-13 MED ORDER — PROPOFOL 10 MG/ML IV BOLUS
INTRAVENOUS | Status: AC
  Filled 2024-11-13: qty 20

## 2024-11-13 MED ORDER — PHENYLEPHRINE 80 MCG/ML (10ML) SYRINGE FOR IV PUSH (FOR BLOOD PRESSURE SUPPORT)
PREFILLED_SYRINGE | INTRAVENOUS | Status: AC
  Filled 2024-11-13: qty 10

## 2024-11-13 MED ORDER — LACTATED RINGERS IV SOLN: INTRAVENOUS | Status: DC

## 2024-11-13 MED ORDER — TRANEXAMIC ACID-NACL 1000-0.7 MG/100ML-% IV SOLN
INTRAVENOUS | Status: DC | PRN
  Administered 2024-11-13: 1000 mg via INTRAVENOUS

## 2024-11-13 MED ORDER — METHOCARBAMOL 500 MG PO TABS: 1000.0000 mg | ORAL_TABLET | Freq: Three times a day (TID) | ORAL | Status: AC | PRN

## 2024-11-13 MED ORDER — BUPIVACAINE-EPINEPHRINE (PF) 0.5% -1:200000 IJ SOLN
INTRAMUSCULAR | Status: AC
  Filled 2024-11-13: qty 30

## 2024-11-13 MED ORDER — FENTANYL CITRATE (PF) 100 MCG/2ML IJ SOLN: 25.0000 ug | INTRAMUSCULAR | Status: DC | PRN

## 2024-11-13 MED ORDER — ACETAMINOPHEN 325 MG PO TABS
ORAL_TABLET | ORAL | Status: AC
  Filled 2024-11-13: qty 2

## 2024-11-13 MED ORDER — DEXMEDETOMIDINE HCL IN NACL 80 MCG/20ML IV SOLN
INTRAVENOUS | Status: DC | PRN
  Administered 2024-11-13: 8 ug via INTRAVENOUS

## 2024-11-13 MED ORDER — MIDAZOLAM HCL 2 MG/2ML IJ SOLN
INTRAMUSCULAR | Status: AC
  Filled 2024-11-13: qty 2

## 2024-11-13 MED ORDER — TRANEXAMIC ACID-NACL 1000-0.7 MG/100ML-% IV SOLN
INTRAVENOUS | Status: AC
  Filled 2024-11-13: qty 100

## 2024-11-13 NOTE — Transfer of Care (Signed)
 Immediate Anesthesia Transfer of Care Note  Patient: Candice Maldonado  Procedure(s) Performed: ARTHROPLASTY, SHOULDER, TOTAL, REVERSE (Right: Shoulder) TENODESIS, BICEPS (Right: Shoulder)  Patient Location: PACU  Anesthesia Type:GA combined with regional for post-op pain  Level of Consciousness: awake, alert , and sedated  Airway & Oxygen Therapy: Patient Spontanous Breathing and Patient connected to face mask oxygen  Post-op Assessment: Report given to RN and Post -op Vital signs reviewed and stable  Post vital signs: Reviewed and stable  Last Vitals:  Vitals Value Taken Time  BP 128/83 11/13/24 13:00  Temp    Pulse 80 11/13/24 13:00  Resp 25 11/13/24 13:00  SpO2 100 % 11/13/24 13:00    Last Pain:  Vitals:   11/13/24 0855  TempSrc:   PainSc: 0-No pain         Complications: No notable events documented.

## 2024-11-13 NOTE — H&P (Signed)
 History of Present Illness: Candice Maldonado is a 69 y.o. female who presents today for her surgical history and physical for upcoming right reverse total shoulder arthroplasty scheduled with Dr. Edie on 11/13/2024. The patient denies any changes in her medical history since her last evaluation. She denies any recent trauma or injury affecting the right shoulder. She denies any personal history of heart attack, stroke, asthma or COPD. No history of DVT. She does report a history of diabetes but is currently not on any medications, she reports that she is controlling with diet. She continues to report an aching throbbing pain and limited range of motion to the right shoulder.  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  Family history of breast cancer  Gall stones  GERD (gastroesophageal reflux disease)  Hyperlipidemia  Localized swelling of both lower legs  Neoplasm of left breast, primary tumor staging category Tis: ductal carcinoma in situ (DCIS) 11/25/2020  Personal history of chemotherapy  Personal history of radiation therapy  Spinal stenosis, 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 No pertinent family history.  Medications: LINZESS 72 mcg capsule Take 72 mcg by mouth once daily  ascorbic acid, vitamin C, (VITAMIN C)  500 MG tablet Take by mouth  aspirin 81 MG EC tablet Take by mouth  atorvastatin  (LIPITOR) 10 MG tablet  baclofen  (LIORESAL ) 10 MG tablet  black cohosh root extract 40 mg Cap Take 40 mg by mouth once daily  celecoxib (CELEBREX) 100 MG capsule Take 1 capsule by mouth twice daily 60 capsule 0  cholecalciferol (VITAMIN D3) 1000 unit tablet Take by mouth  cyanocobalamin (VITAMIN B12) 1000 MCG tablet Take by mouth  diazePAM  (VALIUM ) 5 MG tablet Take 1 tablet one hour prior to MRI and one at time of MRI as needed for anxiety. 2 tablet 0  diethylpropion 25 mg Tab TAKE 1 TABLET BY MOUTH BEFORE EVERY MEAL  ELDERBERRY FRUIT ORAL Take 1 capsule by mouth once daily  escitalopram oxalate (LEXAPRO) 10 MG tablet Take 10 mg by mouth once daily  famotidine  (PEPCID ) 20 MG tablet Take 20 mg by mouth 2 (two) times daily  hydroCHLOROthiazide  (HYDRODIURIL ) 25 MG tablet Take by mouth  HYDROcodone-acetaminophen  (NORCO) 10-325 mg tablet Take 1 tablet by mouth 3 (three) times daily as needed  meloxicam  (MOBIC ) 7.5 MG tablet Take 7.5 mg by mouth once daily  methocarbamoL  (ROBAXIN ) 500 MG tablet Take 500 mg by mouth 3 (three) times daily  naproxen (NAPROSYN) 500 MG tablet Take 500 mg by mouth 2 (two) times daily with meals  nortriptyline (PAMELOR) 10 MG capsule TAKE 1 CAPSULE BY MOUTH NIGHTLY FOR 7 DAYS, THEN INCREASE TO 2 CAPSULES NIGHTLY THEREAFTER 60 capsule 0  omeprazole (PRILOSEC) 10 MG DR capsule  pregabalin  (LYRICA ) 200 MG capsule Take 200 mg by mouth 2 (two) times daily  pyridoxine, vitamin B6, (B-6) 100 MG tablet Take 100 mg by mouth once daily  traZODone (DESYREL) 50 MG tablet TAKE 1  2 TO 1 (ONE HALF TO ONE) TABLET BY MOUTH IN THE EVENING   Allergies: No Known Allergies   Review of Systems:  A comprehensive 14 point ROS was performed, reviewed by me today, and the pertinent orthopaedic findings are documented in the HPI.  Physical Exam: BP 118/70  Ht 162.6 cm (5' 4)  Wt (!) 103.9 kg (229 lb)  BMI  39.31 kg/m  General/Constitutional: The patient appears to be well-nourished, well-developed, and in no acute distress. Neuro/Psych: Normal mood and affect, oriented to person, place and time. Eyes: Non-icteric. Pupils are equal, round, and reactive to light, and exhibit synchronous movement. ENT: Unremarkable. Lymphatic: No palpable adenopathy. Respiratory: Lungs clear to auscultation, Normal chest excursion, No wheezes, and Non-labored breathing Cardiovascular: Regular rate and rhythm. No murmurs. and No edema, swelling or tenderness, except as noted in detailed exam. Integumentary: No impressive skin lesions present, except as noted in detailed exam. Musculoskeletal: Unremarkable, except as noted in detailed exam.  Right shoulder exam: SKIN: normal SWELLING: none WARMTH: none LYMPH NODES: no adenopathy palpable CREPITUS: none TENDERNESS: Mildly tender over anterolateral shoulder ROM (active):  Forward flexion: 30 degrees Abduction: 20 degrees Internal rotation: Right buttock ROM (passive):  Forward flexion: 120 degrees Abduction: 95 degrees  ER/IR at 90 abd: 75 degrees / 30 degrees  She experiences moderate to severe pain with all motions.  STRENGTH: Forward flexion: 2/5 Abduction: 2/5 External rotation: 2/5 Internal rotation: 3-3+/5 Pain with RC testing: Moderate to severe pain with resisted strength testing  STABILITY: Normal  SPECIAL TESTS: Vonzell' test: positive, moderate Speed's test: positive Capsulitis - pain w/ passive ER: no Crossed arm test: Mildly positive Crank: Not evaluated Anterior apprehension: Negative Posterior apprehension: Not evaluated  She is neurovascularly intact to the right upper extremity.  Imaging: MRI OF THE RIGHT SHOULDER WITHOUT CONTRAST:  1. Complete full-thickness retracted insertional tears of the infraspinatus and  supraspinatus tendons with severe and moderate fatty atrophy, respectively.  Increased T2 signal within the  supraspinatus and infraspinatus muscles possibly  reflecting edema or chronic denervation change.  2. Subscapularis tendinosis with suspected high-grade superior insertional tear.  3. Marrow edema along the superior posterior humeral head with mild subcortical  low signal, suspicious for a nondisplaced subacute or chronic fracture.  4. Suspected tendinosis and partial-thickness tear of the intra-articular  biceps tendon extending to the bicipital groove entrance.  5. Superior labral degeneration.  6. Mild-to-moderate glenohumeral joint osteoarthritis with joint effusion.  7. Moderate acromioclavicular joint osteoarthritis.   Impression: 1. Nontraumatic complete tear of right rotator cuff. 2. Tendinitis of upper biceps tendon of right shoulder. 3. Rotator cuff tendinitis, right.  Plan:  1. Treatment options were discussed today with the patient. 2. The patient is scheduled for a right reverse total shoulder arthroplasty with Dr. Edie on 11/13/2024. 3. The patient was instructed on the risk and benefits of surgical intervention at today's appointment. After discussion of the risks and benefits the patient would like to proceed with surgery. 4. This document will serve as a surgical history and physical for the patient. 5. The patient will follow-up per standard postop protocol. They can call the clinic they have any questions, new symptoms develop or symptoms worsen.  The procedure was discussed with the patient, as were the potential risks (including bleeding, infection, nerve and/or blood vessel injury, persistent or recurrent pain, failure of the hardware, dislocation, stress fracture, axillary nerve injury, need for further surgery, blood clots, strokes, heart attacks and/or arhythmias, pneumonia, etc.) and benefits. The patient  states her understanding and wishes to proceed.    H&P reviewed and patient re-examined. No changes.

## 2024-11-13 NOTE — Anesthesia Procedure Notes (Addendum)
 Procedure Name: Intubation Date/Time: 11/13/2024 11:44 AM  Performed by: Lennie Lamarr HERO, CRNAPre-anesthesia Checklist: Patient identified and Emergency Drugs available Patient Re-evaluated:Patient Re-evaluated prior to induction Oxygen Delivery Method: Circle system utilized Preoxygenation: Pre-oxygenation with 100% oxygen Induction Type: IV induction Ventilation: Mask ventilation without difficulty and Oral airway inserted - appropriate to patient size Laryngoscope Size: McGrath and 3 Grade View: Grade II Tube type: Oral Tube size: 7.0 mm Number of attempts: 1 Airway Equipment and Method: Stylet and Oral airway Placement Confirmation: ETT inserted through vocal cords under direct vision, positive ETCO2 and breath sounds checked- equal and bilateral Secured at: 22 cm Tube secured with: Tape Dental Injury: Teeth and Oropharynx as per pre-operative assessment

## 2024-11-13 NOTE — Op Note (Signed)
 11/13/2024  12:26 PM  Patient:   Candice Maldonado  Pre-Op Diagnosis:   Massive irreparable rotator cuff tear with early cuff arthropathy, right shoulder.  Post-Op Diagnosis:   Same  Procedure:   Reverse right total shoulder arthroplasty with biceps tenodesis.  Surgeon:   DOROTHA Reyes Maltos, MD  Assistant:   Gustavo Level, PA-C  Anesthesia:   General endotracheal with an interscalene block using Exparel  placed preoperatively by the anesthesiologist.  Findings:   As above.  Complications:   None  EBL:   75 cc  Fluids:   500 cc crystalloid  UOP:   None  TT:   None  Drains:   None  Closure:   Staples  Implants:   All press-fit [employer] Comprehensive system with a #9 identity micro-humeral stem, a -6 mm extended neutral identity humeral tray with a +3 mm insert, and a mini-base plate with a 36 mm +3 mm lateralized glenosphere.  Brief Clinical Note:   The patient is a 69 year old female with a long history of progressively worsening pain and weakness of the right shoulder.  The patient's symptoms have progressed despite medications, activity modification, etc. The patient's history and examination are consistent with a massive irreparable rotator cuff tear with cuff arthropathy, all of which were confirmed by MRI scan preoperatively. The patient presents at this time for a reverse right total shoulder arthroplasty with biceps tenodesis.  Procedure:   The patient underwent placement of an interscalene block using Exparel  by the anesthesiologist in the preoperative holding area before being brought into the operating room and lain in the supine position. The patient then underwent general endotracheal intubation and anesthesia before the patient was repositioned in the beach chair position using the beach chair positioner. The right shoulder and upper extremity were prepped with ChloraPrep solution before being draped sterilely. Preoperative antibiotics were administered. A  timeout was performed to verify the appropriate surgical site.    A standard anterior approach to the shoulder was made through an approximately 4-5 inch incision. The incision was carried down through the subcutaneous tissues to expose the deltopectoral fascia. The interval between the deltoid and pectoralis muscles was identified and this plane developed, retracting the cephalic vein laterally with the deltoid muscle. The conjoined tendon was identified. Its lateral margin was dissected and the Kolbel self-retraining retractor inserted. The three sisters were identified and cauterized. Bursal tissues were removed to improve visualization.   The biceps tendon was identified near the inferior aspect of the bicipital groove. A soft tissue tenodesis was performed by attaching the biceps tendon to the adjacent pectoralis major tendon using two #0 Ethibond interrupted sutures. The biceps tendon was then transected just proximal to the tenodesis site. The subscapularis tendon was released from its attachment to the lesser tuberosity 1 cm proximal to its insertion and several tagging sutures placed. The inferior capsule was released with care after identifying and protecting the axillary nerve. The proximal humeral cut was made at approximately 25 of retroversion using the extra-medullary guide.   Attention was redirected to the glenoid. The labrum was debrided circumferentially before the center of the glenoid was marked with electrocautery. The guidewire was drilled into the glenoid vault using the appropriate guide. After verifying its position, it was overreamed with the mini-baseplate reamer to create a flat surface. The permanent mini-baseplate was impacted into place. It was stabilized with a 40 x 6.5 mm central screw and four peripheral locking screws. The permanent 36 mm +3 mm lateralized glenosphere was then  impacted into place and its Morse taper locking mechanism verified using manual  distraction.  Attention was directed to the humeral side. The humeral canal was reamed sequentially beginning with the end-cutting reamer then progressing from a 4 mm reamer up to a 9 mm reamer. This provided excellent circumferential chatter. The canal was broached beginning with a #8 broach and progressing to a #9 broach.  The plastic stem was inserted into the end of the broach and the proximal reaming performed. A trial reduction was performed using first the -6 mm extended neutral humeral platform with first the +0 mm and then the +3 mm insert. With the +3 mm insert, the arm demonstrated excellent range of motion as the hand could be brought across the chest to the opposite shoulder and brought to the top of the patient's head and to the patient's ear. The shoulder appeared stable throughout this range of motion. The joint was dislocated and the trial components removed.   The permanent #9 Identity micro-stem was connected with the -6 mm extended neutral humeral platform on the back table before this construct was impacted into place with care taken to maintain the appropriate version. The +3 mm insert was snapped into place. The shoulder was relocated using two finger pressure and again placed through a range of motion with the findings as described above.  The wound was copiously irrigated with sterile saline solution using the jet lavage system before a total of 30 cc of 0.5% Sensorcaine  with epinephrine  was injected into the pericapsular and peri-incisional tissues to help with postoperative analgesia. The subscapularis tendon was reapproximated using #2 FiberWire interrupted sutures. The deltopectoral interval was closed using #0 Vicryl interrupted sutures before the subcutaneous tissues were closed using 2-0 Vicryl interrupted sutures. The skin was closed using staples. A sterile occlusive dressing was applied to the wound before the arm was placed into a shoulder immobilizer with an abduction  pillow. A Polar Care system also was applied to the shoulder. The patient was then transferred back to a hospital bed before being awakened, extubated, and returned to the recovery room in satisfactory condition after tolerating the procedure well.

## 2024-11-13 NOTE — Progress Notes (Signed)
 Pt had bilateral mastectomy with lymph nodes removed. Per Dr. Kradel, ok to put IV in left hand and BP on lower arm.

## 2024-11-13 NOTE — Anesthesia Preprocedure Evaluation (Signed)
"                                    Anesthesia Evaluation  Patient identified by MRN, date of birth, ID band Patient awake    Reviewed: Allergy & Precautions, H&P , NPO status , Patient's Chart, lab work & pertinent test results, reviewed documented beta blocker date and time   Airway Mallampati: II  TM Distance: >3 FB Neck ROM: Full    Dental no notable dental hx. (+) Edentulous Lower, Edentulous Upper   Pulmonary neg pulmonary ROS   Pulmonary exam normal breath sounds clear to auscultation       Cardiovascular Exercise Tolerance: Good negative cardio ROS Normal cardiovascular exam Rhythm:Regular Rate:Normal     Neuro/Psych negative neurological ROS  negative psych ROS   GI/Hepatic negative GI ROS, Neg liver ROS,,,  Endo/Other  negative endocrine ROSdiabetes, Well Controlled, Type 1    Renal/GU negative Renal ROS  negative genitourinary   Musculoskeletal negative musculoskeletal ROS (+)    Abdominal   Peds negative pediatric ROS (+)  Hematology negative hematology ROS (+)   Anesthesia Other Findings   Reproductive/Obstetrics negative OB ROS                              Anesthesia Physical Anesthesia Plan  ASA: 3  Anesthesia Plan: General   Post-op Pain Management: Minimal or no pain anticipated and Regional block*   Induction: Intravenous  PONV Risk Score and Plan: 1 and Ondansetron  and Dexamethasone   Airway Management Planned: Oral ETT  Additional Equipment:   Intra-op Plan:   Post-operative Plan: Extubation in OR  Informed Consent: I have reviewed the patients History and Physical, chart, labs and discussed the procedure including the risks, benefits and alternatives for the proposed anesthesia with the patient or authorized representative who has indicated his/her understanding and acceptance.     Dental advisory given  Plan Discussed with: CRNA  Anesthesia Plan Comments:         Anesthesia  Quick Evaluation  "

## 2024-11-13 NOTE — Evaluation (Signed)
 Occupational Therapy Evaluation Patient Details Name: Candice Maldonado MRN: [id number] DOB: 07/06/1956 Today's Date: 11/13/2024   History of Present Illness   Pt is 69 y/o R reverse total shoulder arthroplasty     Clinical Impressions Upon entering the room, pt seated in recliner chair and agreeable to OT evaluation. Pt lives at home with family and is Ind at baseline. She reports urgency for toileting and assisted with donning LB clothing and ambulates with min guard to bathroom. Pt is able to void and stands at sink to wash hands with supervision. Pt ambulates back to room with supervision. Pt's daughter present for hands on education. OT reviewed precautions, sling use, H/W/E exercises, ADL techniques, and polar care. Hand out provided as well. Daughter returned demonstrations and assisted pt with dressing UB, donning polar care, and donning sling properly. No further questions at this time. All education completed and OT to sign off.      If plan is discharge home, recommend the following:   A little help with walking and/or transfers;A little help with bathing/dressing/bathroom;Assistance with cooking/housework;Assist for transportation      Equipment Recommendations   None recommended by OT      Precautions/Restrictions   Precautions Precautions: Fall     Mobility Bed Mobility               General bed mobility comments: in recliner chair pre/post session    Transfers Overall transfer level: Needs assistance Equipment used: 1 person hand held assist Transfers: Sit to/from Stand Sit to Stand: Contact guard assist, Min assist                  Balance Overall balance assessment: Needs assistance Sitting-balance support: Feet supported Sitting balance-Leahy Scale: Good     Standing balance support: Single extremity supported Standing balance-Leahy Scale: Fair                             ADL either performed or assessed with  clinical judgement   ADL Overall ADL's : Needs assistance/impaired     Grooming: Standing;Wash/dry hands;Supervision/safety           Upper Body Dressing : Maximal assistance;Sitting;Cueing for safety;Cueing for UE precautions   Lower Body Dressing: Moderate assistance;Sit to/from stand   Toilet Transfer: Minimal assistance;Stand-pivot   Toileting- Clothing Manipulation and Hygiene: Moderate assistance;Sit to/from stand               Vision Patient Visual Report: No change from baseline       Perception         Praxis         Pertinent Vitals/Pain Pain Assessment Pain Assessment: No/denies pain Pain Score: 0-No pain     Extremity/Trunk Assessment Upper Extremity Assessment Upper Extremity Assessment: Right hand dominant;RUE deficits/detail RUE: Unable to fully assess due to immobilization   Lower Extremity Assessment Lower Extremity Assessment: Overall WFL for tasks assessed       Communication Communication Communication: No apparent difficulties   Cognition Arousal: Alert Behavior During Therapy: WFL for tasks assessed/performed Cognition: No apparent impairments                               Following commands: Intact       Cueing  General Comments   Cueing Techniques: Verbal cues      Exercises     Shoulder Instructions  Home Living Family/patient expects to be discharged to:: Private residence Living Arrangements: Children (daughter) Available Help at Discharge: Family;Available 24 hours/day Type of Home: House Home Access: Stairs to enter [employer] of Steps: 1   Home Layout: Two level;Full bath on main level     Bathroom Shower/Tub: Tub/shower unit         Home Equipment: Rollator (4 wheels)          Prior Functioning/Environment Prior Level of Function : Independent/Modified Independent                    OT Problem List:     OT Treatment/Interventions:        OT  Goals(Current goals can be found in the care plan section)   Acute Rehab OT Goals Patient Stated Goal: to go home OT Goal Formulation: With patient Time For Goal Achievement: 11/27/24 Potential to Achieve Goals: Fair   OT Frequency:          AM-PAC OT 6 Clicks Daily Activity     Outcome Measure Help from another person eating meals?: A Little Help from another person taking care of personal grooming?: A Little Help from another person toileting, which includes using toliet, bedpan, or urinal?: A Little Help from another person bathing (including washing, rinsing, drying)?: A Little Help from another person to put on and taking off regular upper body clothing?: A Lot Help from another person to put on and taking off regular lower body clothing?: A Little 6 Click Score: 17   End of Session Nurse Communication: Mobility status  Activity Tolerance: Patient tolerated treatment well Patient left: in chair;with family/visitor present                   Time: 8569-8491 OT Time Calculation (min): 38 min Charges:  OT General Charges $OT Visit: 1 Visit OT Evaluation $OT Eval Low Complexity: 1 Low OT Treatments $Self Care/Home Management : 23-37 mins  Izetta Claude, MS, OTR/L , CBIS ascom [phone]  11/13/24, 3:59 PM

## 2024-11-13 NOTE — Discharge Instructions (Addendum)
 Orthopedic discharge instructions: May shower with intact OpSite dressing once nerve block has worn off (Monday).  Apply ice frequently to shoulder. May resume Celebrex 100 mg BID OR take Aleve 2 tablets BID with meals for 3-5 days, then as necessary. Take oxycodone  as prescribed when needed.  May supplement with ES Tylenol  if necessary. May resume daily baby aspirin tomorrow morning. Keep shoulder immobilizer on at all times except may remove for bathing purposes. Follow-up in 10-14 days or as scheduled.

## 2024-11-13 NOTE — Anesthesia Procedure Notes (Signed)
 Anesthesia Regional Block: Interscalene brachial plexus block   Pre-Anesthetic Checklist: , timeout performed,  Correct Patient, Correct Site, Correct Laterality,  Correct Procedure, Correct Position, site marked,  Risks and benefits discussed,  Surgical consent,  Pre-op evaluation,  At surgeon's request and post-op pain management  Laterality: Right and Upper  Prep: chloraprep       Needles:  Injection technique: Single-shot  Needle Type: Stimiplex     Needle Length: 10cm  Needle Gauge: 21     Additional Needles:   Procedures:,,,, ultrasound used (permanent image in chart),,     Nerve Stimulator or Paresthesia:  Response: biceps flexion  Additional Responses:   Narrative:  Start time: 11/13/2024 9:01 AM End time: 11/13/2024 9:03 AM Injection made incrementally with aspirations every 5 mL.  Performed by: Personally   Additional Notes: Functioning IV was confirmed and monitors were applied.  A 50mm 22ga Stimuplex needle was used. Sterile prep and drape,hand hygiene and sterile gloves were used.  Negative aspiration and negative test dose prior to incremental administration of local anesthetic. The patient tolerated the procedure well.

## 2024-11-13 NOTE — Anesthesia Postprocedure Evaluation (Signed)
"   Anesthesia Post Note  Patient: Candice Maldonado  Procedure(s) Performed: ARTHROPLASTY, SHOULDER, TOTAL, REVERSE (Right: Shoulder) TENODESIS, BICEPS (Right: Shoulder)  Patient location during evaluation: PACU Anesthesia Type: General Level of consciousness: awake Pain management: pain level controlled Vital Signs Assessment: post-procedure vital signs reviewed and stable Respiratory status: spontaneous breathing, nonlabored ventilation, respiratory function stable and patient connected to nasal cannula oxygen Cardiovascular status: blood pressure returned to baseline and stable Postop Assessment: no apparent nausea or vomiting Anesthetic complications: no   No notable events documented.   Last Vitals:  Vitals:   11/13/24 0900 11/13/24 1300  BP: 123/80 128/83  Pulse: 76 80  Resp: 12 (!) 25  Temp:  36.4 C  SpO2: 97% 100%    Last Pain:  Vitals:   11/13/24 0855  TempSrc:   PainSc: 0-No pain                 Redell MARLA Breaker      "
